# Patient Record
Sex: Female | Born: 1935 | Race: Black or African American | Hispanic: No | Smoking: Never smoker
Health system: Southern US, Community
[De-identification: ages and names within clinical notes are randomized; demographics above are authoritative.]

## PROBLEM LIST (undated history)

## (undated) DIAGNOSIS — F419 Anxiety disorder, unspecified: Secondary | ICD-10-CM

## (undated) DIAGNOSIS — I4891 Unspecified atrial fibrillation: Secondary | ICD-10-CM

## (undated) DIAGNOSIS — F0150 Vascular dementia without behavioral disturbance: Secondary | ICD-10-CM

## (undated) DIAGNOSIS — F32A Depression, unspecified: Secondary | ICD-10-CM

## (undated) DIAGNOSIS — N189 Chronic kidney disease, unspecified: Secondary | ICD-10-CM

## (undated) DIAGNOSIS — E785 Hyperlipidemia, unspecified: Secondary | ICD-10-CM

## (undated) DIAGNOSIS — E611 Iron deficiency: Secondary | ICD-10-CM

## (undated) DIAGNOSIS — I10 Essential (primary) hypertension: Secondary | ICD-10-CM

## (undated) HISTORY — PX: PACEMAKER IMPLANT: EP1218

---

## 2006-08-17 DIAGNOSIS — E785 Hyperlipidemia, unspecified: Secondary | ICD-10-CM | POA: Insufficient documentation

## 2006-08-17 DIAGNOSIS — Z9079 Acquired absence of other genital organ(s): Secondary | ICD-10-CM | POA: Insufficient documentation

## 2006-09-25 DIAGNOSIS — M129 Arthropathy, unspecified: Secondary | ICD-10-CM | POA: Insufficient documentation

## 2007-12-20 DIAGNOSIS — D649 Anemia, unspecified: Secondary | ICD-10-CM | POA: Insufficient documentation

## 2010-08-11 DIAGNOSIS — F3113 Bipolar disorder, current episode manic without psychotic features, severe: Secondary | ICD-10-CM | POA: Insufficient documentation

## 2010-08-17 DIAGNOSIS — M159 Polyosteoarthritis, unspecified: Secondary | ICD-10-CM | POA: Insufficient documentation

## 2011-04-05 DIAGNOSIS — J45909 Unspecified asthma, uncomplicated: Secondary | ICD-10-CM | POA: Insufficient documentation

## 2013-04-22 DIAGNOSIS — G8929 Other chronic pain: Secondary | ICD-10-CM | POA: Insufficient documentation

## 2013-04-22 DIAGNOSIS — I10 Essential (primary) hypertension: Secondary | ICD-10-CM | POA: Diagnosis present

## 2013-05-27 DIAGNOSIS — Z96653 Presence of artificial knee joint, bilateral: Secondary | ICD-10-CM | POA: Insufficient documentation

## 2013-05-27 DIAGNOSIS — Z9889 Other specified postprocedural states: Secondary | ICD-10-CM | POA: Insufficient documentation

## 2013-12-11 DIAGNOSIS — F319 Bipolar disorder, unspecified: Secondary | ICD-10-CM | POA: Insufficient documentation

## 2019-06-05 DIAGNOSIS — Z961 Presence of intraocular lens: Secondary | ICD-10-CM | POA: Insufficient documentation

## 2019-06-05 DIAGNOSIS — H527 Unspecified disorder of refraction: Secondary | ICD-10-CM | POA: Insufficient documentation

## 2019-06-05 DIAGNOSIS — H04123 Dry eye syndrome of bilateral lacrimal glands: Secondary | ICD-10-CM | POA: Insufficient documentation

## 2019-06-05 DIAGNOSIS — H43812 Vitreous degeneration, left eye: Secondary | ICD-10-CM | POA: Insufficient documentation

## 2020-11-05 ENCOUNTER — Other Ambulatory Visit: Payer: Self-pay

## 2020-11-05 ENCOUNTER — Encounter: Payer: Self-pay | Admitting: Intensive Care

## 2020-11-05 ENCOUNTER — Inpatient Hospital Stay
Admission: EM | Admit: 2020-11-05 | Discharge: 2020-11-08 | DRG: 392 | Disposition: A | Discharge status: Skilled Nursing Facility | Payer: Medicare Other | Source: Skilled Nursing Facility | Attending: Internal Medicine | Admitting: Internal Medicine

## 2020-11-05 ENCOUNTER — Emergency Department: Payer: Medicare Other

## 2020-11-05 DIAGNOSIS — Z7901 Long term (current) use of anticoagulants: Secondary | ICD-10-CM

## 2020-11-05 DIAGNOSIS — R531 Weakness: Secondary | ICD-10-CM

## 2020-11-05 DIAGNOSIS — M545 Low back pain, unspecified: Secondary | ICD-10-CM | POA: Diagnosis not present

## 2020-11-05 DIAGNOSIS — K5900 Constipation, unspecified: Secondary | ICD-10-CM | POA: Diagnosis not present

## 2020-11-05 DIAGNOSIS — Z66 Do not resuscitate: Secondary | ICD-10-CM | POA: Diagnosis present

## 2020-11-05 DIAGNOSIS — Z950 Presence of cardiac pacemaker: Secondary | ICD-10-CM

## 2020-11-05 DIAGNOSIS — E785 Hyperlipidemia, unspecified: Secondary | ICD-10-CM | POA: Diagnosis present

## 2020-11-05 DIAGNOSIS — I129 Hypertensive chronic kidney disease with stage 1 through stage 4 chronic kidney disease, or unspecified chronic kidney disease: Secondary | ICD-10-CM | POA: Diagnosis present

## 2020-11-05 DIAGNOSIS — K5903 Drug induced constipation: Principal | ICD-10-CM | POA: Diagnosis present

## 2020-11-05 DIAGNOSIS — Z8659 Personal history of other mental and behavioral disorders: Secondary | ICD-10-CM

## 2020-11-05 DIAGNOSIS — I2510 Atherosclerotic heart disease of native coronary artery without angina pectoris: Secondary | ICD-10-CM | POA: Diagnosis present

## 2020-11-05 DIAGNOSIS — E875 Hyperkalemia: Secondary | ICD-10-CM | POA: Diagnosis present

## 2020-11-05 DIAGNOSIS — E041 Nontoxic single thyroid nodule: Secondary | ICD-10-CM | POA: Diagnosis present

## 2020-11-05 DIAGNOSIS — N183 Chronic kidney disease, stage 3 unspecified: Secondary | ICD-10-CM

## 2020-11-05 DIAGNOSIS — Z6826 Body mass index (BMI) 26.0-26.9, adult: Secondary | ICD-10-CM

## 2020-11-05 DIAGNOSIS — T40605A Adverse effect of unspecified narcotics, initial encounter: Secondary | ICD-10-CM | POA: Diagnosis present

## 2020-11-05 DIAGNOSIS — F319 Bipolar disorder, unspecified: Secondary | ICD-10-CM | POA: Diagnosis present

## 2020-11-05 DIAGNOSIS — N1832 Chronic kidney disease, stage 3b: Secondary | ICD-10-CM | POA: Diagnosis present

## 2020-11-05 DIAGNOSIS — Z91041 Radiographic dye allergy status: Secondary | ICD-10-CM

## 2020-11-05 DIAGNOSIS — Y92129 Unspecified place in nursing home as the place of occurrence of the external cause: Secondary | ICD-10-CM

## 2020-11-05 DIAGNOSIS — R54 Age-related physical debility: Secondary | ICD-10-CM | POA: Diagnosis present

## 2020-11-05 DIAGNOSIS — R079 Chest pain, unspecified: Secondary | ICD-10-CM

## 2020-11-05 DIAGNOSIS — Z79899 Other long term (current) drug therapy: Secondary | ICD-10-CM

## 2020-11-05 DIAGNOSIS — Z91013 Allergy to seafood: Secondary | ICD-10-CM

## 2020-11-05 DIAGNOSIS — Z8616 Personal history of COVID-19: Secondary | ICD-10-CM

## 2020-11-05 DIAGNOSIS — E46 Unspecified protein-calorie malnutrition: Secondary | ICD-10-CM | POA: Diagnosis present

## 2020-11-05 DIAGNOSIS — Z20822 Contact with and (suspected) exposure to covid-19: Secondary | ICD-10-CM | POA: Diagnosis present

## 2020-11-05 DIAGNOSIS — I482 Chronic atrial fibrillation, unspecified: Secondary | ICD-10-CM | POA: Diagnosis present

## 2020-11-05 DIAGNOSIS — F419 Anxiety disorder, unspecified: Secondary | ICD-10-CM | POA: Diagnosis present

## 2020-11-05 HISTORY — DX: Essential (primary) hypertension: I10

## 2020-11-05 LAB — BASIC METABOLIC PANEL
Anion gap: 5 (ref 5–15)
BUN: 23 mg/dL (ref 8–23)
CO2: 28 mmol/L (ref 22–32)
Calcium: 9.4 mg/dL (ref 8.9–10.3)
Chloride: 98 mmol/L (ref 98–111)
Creatinine, Ser: 1.4 mg/dL — ABNORMAL HIGH (ref 0.44–1.00)
GFR, Estimated: 37 mL/min — ABNORMAL LOW (ref >60)
Glucose, Bld: 78 mg/dL (ref 70–99)
Potassium: 5.2 mmol/L — ABNORMAL HIGH (ref 3.5–5.1)
Sodium: 131 mmol/L — ABNORMAL LOW (ref 135–145)

## 2020-11-05 LAB — RESP PANEL BY RT-PCR (FLU A&B, COVID) ARPGX2
Influenza A by PCR: NEGATIVE
Influenza B by PCR: NEGATIVE
SARS Coronavirus 2 by RT PCR: NEGATIVE

## 2020-11-05 LAB — CBC
HCT: 40 % (ref 36.0–46.0)
Hemoglobin: 12.7 g/dL (ref 12.0–15.0)
MCH: 27.7 pg (ref 26.0–34.0)
MCHC: 31.8 g/dL (ref 30.0–36.0)
MCV: 87.1 fL (ref 80.0–100.0)
Platelets: 267 10*3/uL (ref 150–400)
RBC: 4.59 MIL/uL (ref 3.87–5.11)
RDW: 12.8 % (ref 11.5–15.5)
WBC: 4.8 10*3/uL (ref 4.0–10.5)
nRBC: 0 % (ref 0.0–0.2)

## 2020-11-05 LAB — TSH: TSH: 0.548 u[IU]/mL (ref 0.350–4.500)

## 2020-11-05 LAB — URINALYSIS, COMPLETE (UACMP) WITH MICROSCOPIC
Bacteria, UA: NONE SEEN
Bilirubin Urine: NEGATIVE
Glucose, UA: NEGATIVE mg/dL
Hgb urine dipstick: NEGATIVE
Ketones, ur: NEGATIVE mg/dL
Leukocytes,Ua: NEGATIVE
Nitrite: NEGATIVE
Protein, ur: NEGATIVE mg/dL
Specific Gravity, Urine: 1.005 (ref 1.005–1.030)
pH: 6 (ref 5.0–8.0)

## 2020-11-05 LAB — TROPONIN I (HIGH SENSITIVITY)
Troponin I (High Sensitivity): 14 ng/L (ref <18)
Troponin I (High Sensitivity): 14 ng/L (ref <18)

## 2020-11-05 MED ORDER — IOHEXOL 350 MG/ML SOLN
75.0000 mL | Freq: Once | INTRAVENOUS | Status: AC | PRN
  Administered 2020-11-05: 75 mL via INTRAVENOUS

## 2020-11-05 MED ORDER — APIXABAN 2.5 MG PO TABS
2.5000 mg | ORAL_TABLET | Freq: Two times a day (BID) | ORAL | Status: DC
  Administered 2020-11-06: 2.5 mg via ORAL
  Filled 2020-11-05 (×3): qty 1

## 2020-11-05 MED ORDER — SODIUM POLYSTYRENE SULFONATE 15 GM/60ML PO SUSP
15.0000 g | Freq: Once | ORAL | Status: AC
  Administered 2020-11-05: 15 g via ORAL
  Filled 2020-11-05: qty 60

## 2020-11-05 MED ORDER — TRAMADOL HCL 50 MG PO TABS
50.0000 mg | ORAL_TABLET | Freq: Once | ORAL | Status: AC
  Administered 2020-11-05: 50 mg via ORAL
  Filled 2020-11-05: qty 1

## 2020-11-05 MED ORDER — HYDROMORPHONE HCL 1 MG/ML IJ SOLN
0.5000 mg | Freq: Once | INTRAMUSCULAR | Status: AC
  Administered 2020-11-05: 0.5 mg via INTRAVENOUS
  Filled 2020-11-05: qty 1

## 2020-11-05 MED ORDER — ACETAMINOPHEN 325 MG PO TABS
650.0000 mg | ORAL_TABLET | ORAL | Status: DC | PRN
  Administered 2020-11-07: 650 mg via ORAL
  Filled 2020-11-05: qty 2

## 2020-11-05 MED ORDER — OLANZAPINE 10 MG IM SOLR
5.0000 mg | Freq: Every day | INTRAMUSCULAR | Status: AC
  Administered 2020-11-05: 5 mg via INTRAMUSCULAR
  Filled 2020-11-05: qty 10

## 2020-11-05 MED ORDER — ONDANSETRON HCL 4 MG/2ML IJ SOLN: 4.0000 mg | Freq: Four times a day (QID) | INTRAMUSCULAR | Status: DC | PRN

## 2020-11-05 MED ORDER — MORPHINE SULFATE (PF) 4 MG/ML IV SOLN
4.0000 mg | Freq: Once | INTRAVENOUS | Status: AC
  Administered 2020-11-05: 4 mg via INTRAVENOUS
  Filled 2020-11-05: qty 1

## 2020-11-05 MED ORDER — OLANZAPINE 5 MG PO TABS: 5.0000 mg | ORAL_TABLET | Freq: Every day | ORAL | Status: DC

## 2020-11-05 MED ORDER — ENOXAPARIN SODIUM 40 MG/0.4ML IJ SOSY: 40.0000 mg | PREFILLED_SYRINGE | INTRAMUSCULAR | Status: DC

## 2020-11-05 MED ORDER — ACETAMINOPHEN 650 MG RE SUPP: 650.0000 mg | Freq: Four times a day (QID) | RECTAL | Status: DC | PRN

## 2020-11-05 MED ORDER — DILTIAZEM HCL 25 MG/5ML IV SOLN
10.0000 mg | Freq: Two times a day (BID) | INTRAVENOUS | Status: DC | PRN
Start: 2020-11-05 — End: 2020-11-08

## 2020-11-05 MED ORDER — ALPRAZOLAM 0.25 MG PO TABS
0.2500 mg | ORAL_TABLET | Freq: Two times a day (BID) | ORAL | Status: DC | PRN
  Administered 2020-11-05 – 2020-11-07 (×3): 0.25 mg via ORAL
  Filled 2020-11-05 (×3): qty 1

## 2020-11-05 MED ORDER — LORAZEPAM 2 MG/ML IJ SOLN: 1.0000 mg | Freq: Once | INTRAMUSCULAR | Status: DC

## 2020-11-05 MED ORDER — SODIUM CHLORIDE 0.9 % IV BOLUS
1000.0000 mL | Freq: Once | INTRAVENOUS | Status: AC
  Administered 2020-11-05: 1000 mL via INTRAVENOUS

## 2020-11-05 NOTE — ED Triage Notes (Signed)
Pt comes into the ED via EMS from [facility] with c/o pain all over x3 days  220/110 HR67 97%RA 98.6 temp

## 2020-11-05 NOTE — ED Triage Notes (Signed)
PAtient arrived from [facility] by EMS. Reports pain all over X3 days. C/o chest tightness. Patient crying out in triage. Denies falling

## 2020-11-05 NOTE — ED Notes (Signed)
Pt had difficulty swallowing the oral  Kayexalate.  She began to cough

## 2020-11-05 NOTE — ED Provider Notes (Signed)
[facility] Emergency Department Provider Note ____________________________________________   Event Date/Time   First MD Initiated Contact with Patient 11/05/20 1502     (approximate)  I have reviewed the triage vital signs and the nursing notes.   HISTORY  Chief Complaint Chest Pain  Level 5 COVID: History of present illness limited due to poor historian  HPI Katrina Chen is a 85 y.o. female with PMH of hypertension, atrial fibrillation (on Eliquis), CKD, and hyperlipidemia who presents with multiple symptoms.  The patient reports pain "all over" over the last couple days and specifically heaviness in her chest and head.  She also has some burning in both legs.  The daughter reports that the patient's blood pressure medication was changed last week and her blood pressure has been higher than normal and fluctuating.  The patient has been fatigued and over the last couple of days seems slightly confused compared to baseline.  She has not been eating and drinking much and has decreased urine output.  The patient and daughter deny fever, vomiting, significant abdominal pain, or dysuria.  Past Medical History:  Diagnosis Date   Hypertension     Patient Active Problem List   Diagnosis Date Noted   Chest pain 11/05/2020   CAD (coronary artery disease) 11/05/2020   Hyperkalemia 11/05/2020   Obstipation 11/05/2020   History of bipolar disorder 11/05/2020   Atrial fibrillation, chronic (HCC) 11/05/2020   CKD (chronic kidney disease), stage III (HCC) 11/05/2020   Frail elderly 11/05/2020   Weakness 11/05/2020   Protein-calorie malnutrition (HCC) 11/05/2020     History reviewed. No pertinent surgical history.  Prior to Admission medications   Not on File    Allergies Iodine and Shellfish allergy  History reviewed. No pertinent family history.  Social History Social History   Tobacco Use   Smoking status: Never   Smokeless tobacco: Never   Substance Use Topics   Alcohol use: Never   Drug use: Never    Review of Systems Level 5 caveat: Review of systems limited due to poor historian Constitutional: No fever.  Positive for fatigue. Cardiovascular: Positive for chest pain. Respiratory: Denies acute shortness of breath. Gastrointestinal: No vomiting or diarrhea.  Genitourinary: Negative for dysuria.  Musculoskeletal: Positive for back pain. Neurological: Positive for headache.   ____________________________________________   PHYSICAL EXAM:  VITAL SIGNS: ED Triage Vitals  Enc Vitals Group     BP 11/05/20 1332 120/83     Pulse Rate 11/05/20 1332 61     Resp 11/05/20 1332 18     Temp 11/05/20 1332 98.2 F (36.8 C)     Temp Source 11/05/20 1332 Oral     SpO2 11/05/20 1332 100 %     Weight 11/05/20 1333 169 lb (76.7 kg)     Height 11/05/20 1333 5\' 7"  (1.702 m)     Head Circumference --      [facility] --      Pain Score 11/05/20 1332 10     Pain Loc --      Pain Edu? --      Excl. in GC? --     Constitutional: Alert and oriented.  Somewhat weak appearing but in no acute distress. Eyes: Conjunctivae are normal.  Head: Atraumatic. Nose: No congestion/rhinnorhea. Mouth/Throat: Mucous membranes are dry. Neck: Normal range of motion.  Cardiovascular: Normal rate, regular rhythm. Good peripheral circulation. Respiratory: Normal respiratory effort.  No retractions.  Gastrointestinal: Soft and nontender. No distention.  Genitourinary: No flank tenderness.  Musculoskeletal: No lower extremity edema.  Extremities warm and well perfused.  Neurologic:  Normal speech and language.  Motor intact in all extremities.  No gross neurological deficits. Skin:  Skin is warm and dry. No rash noted. Psychiatric: Calm and cooperative.  ____________________________________________   LABS (all labs ordered are listed, but only abnormal results are displayed)  Labs Reviewed  BASIC METABOLIC PANEL - Abnormal; Notable for the  following components:      Result Value   Sodium 131 (*)    Potassium 5.2 (*)    Creatinine, Ser 1.40 (*)    GFR, Estimated 37 (*)    All other components within normal limits  URINALYSIS, COMPLETE (UACMP) WITH MICROSCOPIC - Abnormal; Notable for the following components:   Color, Urine STRAW (*)    APPearance CLEAR (*)    All other components within normal limits  RESP PANEL BY RT-PCR (FLU A&B, COVID) ARPGX2  CBC  TSH  LIPID PANEL  VITAMIN B12  MAGNESIUM  TROPONIN I (HIGH SENSITIVITY)  TROPONIN I (HIGH SENSITIVITY)   ____________________________________________  EKG  ED ECG REPORT I, Dionne Bucy, the attending physician, personally viewed and interpreted this ECG.  Date: 11/05/2020 EKG Time: 1339 Rate: 62 Rhythm: Ventricular paced rhythm QRS Axis: N/A Intervals: N/A ST/T Wave abnormalities: normal Narrative Interpretation: Ventricular paced rhythm with no evidence of acute ischemia; no prior EKG available for comparison  ____________________________________________  RADIOLOGY  Chest x-ray interpreted by me shows no focal consolidation or edema CT abdomen/pelvis: No acute abnormality CT lumbar spine: No acute fracture CT head: No acute abnormality CT angio chest/abdomen/pelvis: No acute vascular abnormality  ____________________________________________   PROCEDURES  Procedure(s) performed: No  Procedures  Critical Care performed: No ____________________________________________   INITIAL IMPRESSION / ASSESSMENT AND PLAN / ED COURSE  Pertinent labs & imaging results that were available during my care of the patient were reviewed by me and considered in my medical decision making (see chart for details).   85 year old female with PMH as noted above including hypertension, atrial fibrillation on Eliquis, pacemaker, CKD, and hyperlipidemia presents with multiple symptoms over the last several days to 1 week, most significantly generalized pain, some  chest pain and headache, fatigue, and decreased p.o. intake.  The daughter also endorses some mild confusion.  I reviewed the past medical records in epic; the patient has no prior ED visits or admissions here.  I also reviewed records in care everywhere.  The patient was seen by cardiology on 7/22 and at that time no changes were made in her hypertension regimen.  The daughter reports that her blood pressure medications were changed last week but I do not have access to a record of this visit.  On exam the patient is somewhat weak appearing but in no acute distress.  Her vital signs here are normal.  The physical exam is unremarkable except for dry mucous membranes.  Abdomen soft and nontender.  Neurologic exam is nonfocal.  Differential is extremely broad but includes ACS, rapid atrial fibrillation, dehydration, AKI, electrolyte or other metabolic abnormality, UTI, COVID-19, or other infection.  I do not suspect aortic dissection or other vascular etiology given that the generalized location of the pain and the fact that it appears to have now mostly resolved.  We will obtain lab work-up, chest x-ray, urinalysis, give fluids and reassess.  ----------------------------------------- 7:15 PM on 11/05/2020 -----------------------------------------  Urinalysis and other lab work-up is unremarkable.  The patient is COVID-negative.  Troponins are negative.  Chest x-ray did not  show any acute abnormality.  Although she was comfortable during my initial exam, on reassessment the patient was tearful and was reporting increased pain that now localized mainly to the lower back.  Based on discussion with the patient and daughter I ordered a CT head (due to the recent mild confusion) as well as a CT of the abdomen, pelvis, and L-spine to evaluate for intra-abdominal causes, lumbar compression fracture, or other cause of the acute pain.  These studies are all negative.  On reassessment, the patient again has  severe pain which is all over but worse in the back.  Although her initial presentation was not consistent with vascular etiology, given this persistent severe pain, the location in the chest and back, and the patient's elevated blood pressure, I have ordered a CT angio dissection study to evaluate for aortic dissection, renal artery infarct, or other vascular cause.  ----------------------------------------- 9:30 PM on 11/05/2020 -----------------------------------------   CT angio showed no acute findings other than a stool ball in the rectum which would not explain the patient's symptoms.  On reassessment, the patient continues to appear tearful and reports pain although has difficulty localizing it.  The son-in-law pulled me aside and stated that the patient has a long history of psychiatric problems and has had similar episodes mediated by anxiety, and he feels that this is the likely etiology of the patient's current presentation.  Given the extensive negative work-up overall I feel that anxiety or conversion is a likely cause of the patient's symptoms.  Given her age and the fact that she is still having persistent pain,   ____________________________________________   FINAL CLINICAL IMPRESSION(S) / ED DIAGNOSES  Final diagnoses:  Low back pain  Nonspecific chest pain      NEW MEDICATIONS STARTED DURING THIS VISIT:  New Prescriptions   No medications on file     Note:  This document was prepared using Dragon voice recognition software and may include unintentional dictation errors.    Dionne Bucy, MD 11/05/20 2352

## 2020-11-05 NOTE — H&P (Signed)
History and Physical   Katrina Chen FWY:[id number] DOB: 04/29/1935 DOA: 11/05/2020  PCP: Youlanda Roys, MD  Outpatient Specialists: Dr. Harle Stanford, [place] Patient coming from: [facility]  I have personally briefly reviewed patient's old medical records in [place].  Chief Concern: Chest pain, diffuse body pain  HPI: Katrina Chen is a 85 y.o. female with medical history significant for history of bipolar, history of hypertension, chronic atrial fibrillation, CHA2DS2-VASc score of 4, on chronic anticoagulation, status post pacemaker, diverticulosis, CAD, presents to the emergency department for chief concerns of hurting all over including chest pain.  At bedside patient is awake and alert and oriented.  Weakly follows commands.  However she does not want to participate fully in physical exam.  She moans and groans.  Daughter and son-in-law are at bedside.  Daughter reports that patient had a bowel movement 2 days ago. She lives at facility, [facility] and daughter does not know the consistency of her bowel movement last.  Social history: Patient is from [facility]. Daughter at bedside denies patient's history of tobacco, recreational, EtOH use.  Patient is currently retired and formerly worked as a Tax inspector.  Vaccination history: Patient is vaccinated for COVID-19, 2 doses.  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  ROS: Unable to complete as patient does not participate in physical exam  ED Course: Discussed with emergency medicine provider, patient requiring hospitalization for chief concerns of chest pain.  Vitals in the emergency department was remarkable for temperature of 98.2, respiration rate of 18, heart rate 61, blood pressure 120/83, SPO2 100% on room air.  Labs in the emergency department was remarkable for sodium 131, potassium 5.2, chloride 98, bicarb 28, BUN 23, serum creatinine of 1.40, nonfasting blood glucose of 78, EGFR  37, WBC 4.8, hemoglobin 12.7, platelets 267.  UA was negative for nitrates and leukocytes.  COVID PCR was negative.  Troponin was 14 and on recheck it was 14.  TSH was within normal limits.  EDP ordered morphine 4 mg IV, tramadol 50 mg, Ativan 1 mg IV, Dilaudid 0.5 mg IV, normal saline 1 L bolus.  Assessment/Plan  Principal Problem:   Obstipation Active Problems:   Chest pain   CAD (coronary artery disease)   Hyperkalemia   History of bipolar disorder   Atrial fibrillation, chronic (HCC)   CKD (chronic kidney disease), stage III (HCC)   Frail elderly   Weakness   Protein-calorie malnutrition (HCC)   # Diffuse generalized pain and weakness - I suspect this is obstipation in setting of chronic constipation - CT imaging showed 6 cm stool ball in the left colon - Checking magnesium level, B12 - Would recommend a.m. team to consult PT/OT if patient's weakness does not improve with bowel movement  # She initially reported chest pain-I doubt this is ACS as troponin has been 14 and 14 on repeat # History of CAD - EKG was negative for ST-T wave changes  # Hyperkalemia-I suspect this is secondary to constipation - Kayexalate 15 mg oral, one-time dose ordered - BMP in the a.m.  # CKD 3B-at baseline - Serum creatinine on presentation is 1.4, GFR 37 - Baseline serum creatinine from 2020 until now is 1.2-1.5, EGFR ranging from 37-45-  # History of hypertension-diltiazem injection 10 mg IV twice daily, prn for SBP greater than 170, 3 doses ordered -Patient takes carvedilol 3.125 mg tablets, 6.25 mg p.o. twice daily with meals, losartan 100 mg daily  # History of atrial  fibrillation on Eliquis - Eliquis 2.5 twice daily resumed  # History of anxiety/anxiety-EDP ordered Ativan 1 mg IV once - Duloxetine 60 mg twice daily - I resumed her home Xanax 0.25 mg p.o. twice daily as needed for anxiety  # 1.9 cm thyroid nodule-outpatient follow-up # Protein/calorie malnutrition-mild to  moderate - Dietitian has been consulted  # History of bipolar-Zyprexa 5 mg nightly  # Diverticulosis-no diverticulitis at this time  Takes multivitamins at home including calcium carbonate 500 mg every 4 hours by mouth, vitamin D, 50,000 units once per week Chart reviewed.   DVT prophylaxis: Eliquis 2.5 mg twice daily Code Status: Full code Diet: Heart healthy Family Communication: Updated daughter at bedside Disposition Plan: Pending clinical course Consults called: Dietary Admission status: Observation, MedSurg, telemetry ordered for 12 hours  Past Medical History:  Diagnosis Date   Hypertension    History reviewed. No pertinent surgical history.  Social History:  reports that she has never smoked. She has never used smokeless tobacco. She reports that she does not drink alcohol and does not use drugs.  Allergies  Allergen Reactions   Iodine    Shellfish Allergy    History reviewed. No pertinent family history. Family history: Family history reviewed and not pertinent  Prior to Admission medications   Not on File   Physical Exam: Vitals:   11/05/20 1500 11/05/20 1715 11/05/20 1900 11/05/20 2030  BP: (!) 140/59  (!) 183/163 (!) 153/136  Pulse: 60 97 61 (!) 47  Resp: 13 (!) 26 20 (!) 22  Temp:      TempSrc:      SpO2: 99% 100% 100% 100%  Weight:      Height:       Constitutional: appears age-appropriate, frail, NAD, calm, comfortable Eyes: PERRL, lids and conjunctivae normal ENMT: Mucous membranes are moist. Posterior pharynx clear of any exudate or lesions. Age-appropriate dentition.  Dentures in place.  Hearing appropriate. Neck: normal, supple, no masses, no thyromegaly Respiratory: clear to auscultation bilaterally, no wheezing, no crackles. Normal respiratory effort. No accessory muscle use.  Cardiovascular: Regular rate and rhythm, no murmurs / rubs / gallops. No extremity edema. 2+ pedal pulses. No carotid bruits.  Abdomen: no masses palpated, no  hepatosplenomegaly.  Decreased bowel sounds.  Mild generalized abdominal tenderness.  Soft. Musculoskeletal: no clubbing / cyanosis. No joint deformity upper and lower extremities. Good ROM, no contractures, no atrophy. Normal muscle tone.  Skin: no rashes, lesions, ulcers. No induration Neurologic: Sensation intact. Strength 4/5 in all 4.  Weakly moves extremities. Psychiatric: Normal judgment and insight. Alert and oriented x 3. Normal mood.   EKG: independently reviewed, showing ventricular paced, atrial flutter rate of 62, QTc 464  Chest x-ray on Admission: I personally reviewed and I agree with radiologist reading as below.  CT ABDOMEN PELVIS WO CONTRAST  Result Date: 11/05/2020 CLINICAL DATA:  Flank pain, kidney stone suspected Low back pain, increased fracture risk Back and flank pain, unknown cause. Denies falling, pt states "it feels like my back is breaking!" EXAM: CT ABDOMEN AND PELVIS WITHOUT CONTRAST CT LUMBAR SPINE WITHOUT CONTRAST TECHNIQUE: Multidetector CT imaging of the abdomen and pelvis was performed following the standard protocol without IV contrast. Multidetector CT imaging of the lumbar spine was performed without intravenous contrast administration. Multiplanar CT image reconstructions were also generated. COMPARISON:  None. FINDINGS: CT ABD/PELVIS Lower chest: Linear atelectasis versus scarring of the right base. Partially visualized cardiac lead. Otherwise no acute abnormality. Hepatobiliary: No focal liver abnormality. No gallstones,  gallbladder wall thickening, or pericholecystic fluid. No biliary dilatation. Pancreas: No focal lesion. Normal pancreatic contour. No surrounding inflammatory changes. No main pancreatic ductal dilatation. Spleen: Normal in size without focal abnormality. Adrenals/Urinary Tract: No adrenal nodule bilaterally. No nephrolithiasis and no hydronephrosis. No definite contour-deforming renal mass. No ureterolithiasis or hydroureter. The urinary bladder  is distended with urine and grossly unremarkable. Stomach/Bowel: Stomach is within normal limits. No evidence of bowel wall thickening or dilatation. Stool throughout the left colon. Diffuse sigmoid diverticulosis. No pneumatosis. Appendix appears normal. Vascular/Lymphatic: No abdominal aorta or iliac aneurysm. Severe atherosclerotic plaque of the aorta and its branches. No abdominal, pelvic, or inguinal lymphadenopathy. Reproductive: Status post hysterectomy. No adnexal masses. Other: No intraperitoneal free fluid. No intraperitoneal free gas. No organized fluid collection. Musculoskeletal: No abdominal wall hernia or abnormality. No suspicious lytic or blastic osseous lesions. No acute displaced fracture. CT LUMBAR Segmentation: 5 non rib-bearing lumbar report T Wo bodies are identified Alignment: Straightening of the normal lumbar lordosis likely due to positioning and surgical hardware. Vertebrae: Status post L3 through S1 posterolateral and interbody fusion. Status post laminectomy at these levels. Multilevel degenerative changes of the spine. No acute fracture or focal pathologic process. No suspicious lytic or blastic osseous lesions. There is a densely sclerotic lesion within the right iliac bone that may represent a bone island. Well-defined lytic lesion with a narrow zone of transmission within the right iliac bone along the sacroiliac joint is likely of benign etiology. Paraspinal and other soft tissues: Negative. IMPRESSION: 1. No acute intra-abdominal or intrapelvic abnormality with limited evaluation on this noncontrast study. 2. Diffuse sigmoid diverticulosis with no acute diverticulitis. 3. Stool throughout the left colon. 4. No acute displaced fracture or traumatic listhesis of the lumbar spine in a patient status post L3 through S1 posterolateral and interbody fusion. 5. A densely sclerotic lesion within the right iliac bone may represent a bone island. Comparison with prior imaging would be of  value. 6.  Aortic Atherosclerosis (ICD10-I70.0)  - severe. Electronically Signed   By: Iven Finn M.D.   On: 11/05/2020 18:19   DG Chest 2 View  Result Date: 11/05/2020 CLINICAL DATA:  Chest tightness for 3 days EXAM: CHEST - 2 VIEW COMPARISON:  None. FINDINGS: Frontal and lateral views of the chest demonstrate an unremarkable cardiac silhouette. Loop recorder is seen within the left anterior chest. No airspace disease, effusion, or pneumothorax. No acute bony abnormalities. IMPRESSION: 1. No acute intrathoracic process. Electronically Signed   By: Randa Ngo M.D.   On: 11/05/2020 15:42   CT HEAD WO CONTRAST (5MM)  Result Date: 11/05/2020 CLINICAL DATA:  Mental status change. Unknown cause. Pain all over. Chest tightness. EXAM: CT HEAD WITHOUT CONTRAST TECHNIQUE: Contiguous axial images were obtained from the base of the skull through the vertex without intravenous contrast. COMPARISON:  None. BRAIN: Atherosclerotic calcifications are present within the cavernous internal carotid arteries. No evidence of large-territorial acute infarction. No parenchymal hemorrhage. No mass lesion. No extra-axial collection. No mass effect or midline shift. No hydrocephalus. Basilar cisterns are patent. Vascular: No hyperdense vessel. Skull: No acute fracture or focal lesion. Sinuses/Orbits: Paranasal sinuses and mastoid air cells are clear. Bilateral lens replacement. Otherwise orbits are unremarkable. Other: None. IMPRESSION: No acute intracranial abnormality. Electronically Signed   By: Iven Finn M.D.   On: 11/05/2020 18:07   CT L-SPINE NO CHARGE  Result Date: 11/05/2020 CLINICAL DATA:  Flank pain, kidney stone suspected Low back pain, increased fracture risk Back and flank pain,  unknown cause. Denies falling, pt states "it feels like my back is breaking!" EXAM: CT ABDOMEN AND PELVIS WITHOUT CONTRAST CT LUMBAR SPINE WITHOUT CONTRAST TECHNIQUE: Multidetector CT imaging of the abdomen and pelvis was  performed following the standard protocol without IV contrast. Multidetector CT imaging of the lumbar spine was performed without intravenous contrast administration. Multiplanar CT image reconstructions were also generated. COMPARISON:  None. FINDINGS: CT ABD/PELVIS Lower chest: Linear atelectasis versus scarring of the right base. Partially visualized cardiac lead. Otherwise no acute abnormality. Hepatobiliary: No focal liver abnormality. No gallstones, gallbladder wall thickening, or pericholecystic fluid. No biliary dilatation. Pancreas: No focal lesion. Normal pancreatic contour. No surrounding inflammatory changes. No main pancreatic ductal dilatation. Spleen: Normal in size without focal abnormality. Adrenals/Urinary Tract: No adrenal nodule bilaterally. No nephrolithiasis and no hydronephrosis. No definite contour-deforming renal mass. No ureterolithiasis or hydroureter. The urinary bladder is distended with urine and grossly unremarkable. Stomach/Bowel: Stomach is within normal limits. No evidence of bowel wall thickening or dilatation. Stool throughout the left colon. Diffuse sigmoid diverticulosis. No pneumatosis. Appendix appears normal. Vascular/Lymphatic: No abdominal aorta or iliac aneurysm. Severe atherosclerotic plaque of the aorta and its branches. No abdominal, pelvic, or inguinal lymphadenopathy. Reproductive: Status post hysterectomy. No adnexal masses. Other: No intraperitoneal free fluid. No intraperitoneal free gas. No organized fluid collection. Musculoskeletal: No abdominal wall hernia or abnormality. No suspicious lytic or blastic osseous lesions. No acute displaced fracture. CT LUMBAR Segmentation: 5 non rib-bearing lumbar report T Wo bodies are identified Alignment: Straightening of the normal lumbar lordosis likely due to positioning and surgical hardware. Vertebrae: Status post L3 through S1 posterolateral and interbody fusion. Status post laminectomy at these levels. Multilevel  degenerative changes of the spine. No acute fracture or focal pathologic process. No suspicious lytic or blastic osseous lesions. There is a densely sclerotic lesion within the right iliac bone that may represent a bone island. Well-defined lytic lesion with a narrow zone of transmission within the right iliac bone along the sacroiliac joint is likely of benign etiology. Paraspinal and other soft tissues: Negative. IMPRESSION: 1. No acute intra-abdominal or intrapelvic abnormality with limited evaluation on this noncontrast study. 2. Diffuse sigmoid diverticulosis with no acute diverticulitis. 3. Stool throughout the left colon. 4. No acute displaced fracture or traumatic listhesis of the lumbar spine in a patient status post L3 through S1 posterolateral and interbody fusion. 5. A densely sclerotic lesion within the right iliac bone may represent a bone island. Comparison with prior imaging would be of value. 6.  Aortic Atherosclerosis (ICD10-I70.0)  - severe. Electronically Signed   By: Iven Finn M.D.   On: 11/05/2020 18:19   CT Angio Chest/Abd/Pel for Dissection W and/or Wo Contrast  Result Date: 11/05/2020 CLINICAL DATA:  Abdominal pain, aortic dissection suspected EXAM: CT ANGIOGRAPHY CHEST, ABDOMEN AND PELVIS TECHNIQUE: Non-contrast CT of the chest was initially obtained. Multidetector CT imaging through the chest, abdomen and pelvis was performed using the standard protocol during bolus administration of intravenous contrast. Multiplanar reconstructed images and MIPs were obtained and reviewed to evaluate the vascular anatomy. CONTRAST:  84mL OMNIPAQUE IOHEXOL 350 MG/ML SOLN COMPARISON:  None. FINDINGS: CTA CHEST FINDINGS Cardiovascular: Preferential opacification of the thoracic aorta. No evidence of thoracic aortic aneurysm or dissection. Moderate atherosclerotic plaque of the thoracic aorta. Four-vessel coronary artery calcifications. Normal heart size. No significant pericardial effusion. The  main pulmonary artery is normal in caliber. No central pulmonary embolus. Mediastinum/Nodes: No enlarged mediastinal, hilar, or axillary lymph nodes. Multiple hypodensities within thyroid  glands. There is a 1.9 cm right thyroid gland hypodense nodule. The trachea and esophagus demonstrate no significant findings. Lungs/Pleura: No focal consolidation. No pulmonary nodule. No pulmonary mass. No pleural effusion. No pneumothorax. Musculoskeletal: No chest wall abnormality. No suspicious lytic or blastic osseous lesions. No acute displaced fracture. Multilevel mild degenerative changes of the spine. Review of the MIP images confirms the above findings. CTA ABDOMEN AND PELVIS FINDINGS VASCULAR Aorta: Severe calcified and noncalcified atherosclerotic plaque. Normal caliber aorta without aneurysm, dissection, vasculitis. Celiac: At least mild atherosclerotic plaque. At least mild stenosis of the origin of the celiac artery. Patent without evidence of aneurysm, dissection, vasculitis or significant stenosis. SMA: At least mild atherosclerotic plaque. Patent without evidence of aneurysm, dissection, vasculitis or significant stenosis. Renals: Atherosclerotic plaque moderate severe stenosis at the origin of bilateral renal arteries. Opacification of the vessels distally is noted. Both renal arteries are patent without evidence of aneurysm, dissection, vasculitis, fibromuscular dysplasia or significant stenosis. IMA: Atherosclerotic plaque. 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 Opacification of the inferior mesenteric artery is noted distally. Otherwise patent without evidence of aneurysm, dissection, vasculitis or significant stenosis. Inflow: Patent without evidence of aneurysm, dissection, vasculitis or significant stenosis. Veins: No obvious venous abnormality within the limitations of this arterial phase study. Review of the MIP images confirms the above findings.  NON-VASCULAR Hepatobiliary: No focal liver abnormality. No gallstones, gallbladder wall thickening, or pericholecystic fluid. No biliary dilatation. Pancreas: No focal lesion. Normal pancreatic contour. No surrounding inflammatory changes. No main pancreatic ductal dilatation. Spleen: Normal in size without focal abnormality. Adrenals/Urinary Tract: No adrenal nodule bilaterally. Bilateral kidneys enhance symmetrically. 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 No hydronephrosis. No hydroureter. The urinary bladder is distended with urine. Stomach/Bowel: Stomach is within normal limits. No evidence of bowel wall thickening or dilatation. Mid rectal stool ball of 6 cm. Stool noted throughout the left colon. Diffuse sigmoid diverticulosis. No pneumatosis. Appendix appears normal. Lymphatic: No lymphadenopathy. Reproductive: Status post hysterectomy. No adnexal masses. Other: No intraperitoneal free fluid. No intraperitoneal free gas. No organized fluid collection. Musculoskeletal: No abdominal wall hernia or abnormality. No suspicious lytic or blastic osseous lesions. No acute displaced fracture. Status post L3 through S1 posterolateral and interbody usion. Status post laminectomy at these levels. Multilevel dgenerative changes of the spine. Review of the MIP images confirms the above findings. IMPRESSION: 1. No acute aortic abnormality. 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 2. No central pulmonary embolus. 3. No acute intrathoracic, intra-abdominal, intrapelvic abnormality. 4. Stool throughout the left colon with a 6 cm stool ball within the mid rectum. Diffuse sigmoid diverticulosis with no acute diverticulitis. 5. A 1.9 cm right thyroid gland nodule Recommend thyroid US (ref: J Am Coll Radiol. 2015 Feb;12(2):  143-50). In the setting of significant comorbidities or limited life expectancy, no follow-up recommended (ref: J Am Coll Radiol. 2015 Feb;12(2): 143-50). Electronically Signed   By: Tish Frederickson M.D.   On: 11/05/2020 20:20    Labs on Admission: I have personally reviewed following labs  CBC: Recent Labs  Lab 11/05/20 1340  WBC 4.8  HGB 12.7  HCT 40.0  MCV 87.1  PLT 267   Basic Metabolic Panel: Recent Labs  Lab 11/05/20 1340  NA 131*  K 5.2*  CL 98  CO2 28  GLUCOSE 78  BUN 23  CREATININE 1.40*  CALCIUM 9.4   GFR: Estimated Creatinine Clearance: 32.5 mL/min (A) (by C-G formula based on SCr of 1.4 mg/dL (H)).  Thyroid Function Tests: Recent Labs    11/05/20 1535  TSH 0.548   Anemia Panel: No results for input(s): VITAMINB12, FOLATE, FERRITIN, TIBC, IRON, RETICCTPCT in the last 72 hours.  Urine analysis:    Component Value Date/Time   COLORURINE STRAW (A) 11/05/2020 1535   APPEARANCEUR CLEAR (A) 11/05/2020 1535   LABSPEC 1.005 11/05/2020 1535   PHURINE 6.0 11/05/2020 1535   GLUCOSEU NEGATIVE 11/05/2020 1535   HGBUR NEGATIVE 11/05/2020 1535   BILIRUBINUR NEGATIVE 11/05/2020 1535   KETONESUR NEGATIVE 11/05/2020 1535   PROTEINUR NEGATIVE 11/05/2020 1535   NITRITE NEGATIVE 11/05/2020 1535   LEUKOCYTESUR NEGATIVE 11/05/2020 1535   Dr. Tobie Poet [facility]  If 7PM-7AM, please contact overnight-coverage provider If 7AM-7PM, please contact day coverage provider [email or web address]  11/05/2020, 10:24 PM

## 2020-11-05 NOTE — ED Notes (Addendum)
Pt did not tolerate oral medication well.  Md aware. Unsafe to swallow d/t aspiration concerns.

## 2020-11-06 DIAGNOSIS — K5900 Constipation, unspecified: Secondary | ICD-10-CM | POA: Diagnosis not present

## 2020-11-06 LAB — LIPID PANEL
Cholesterol: 172 mg/dL (ref 0–200)
HDL: 48 mg/dL (ref >40)
LDL Cholesterol: 104 mg/dL — ABNORMAL HIGH (ref 0–99)
Total CHOL/HDL Ratio: 3.6 RATIO
Triglycerides: 101 mg/dL (ref <150)
VLDL: 20 mg/dL (ref 0–40)

## 2020-11-06 LAB — VITAMIN B12: Vitamin B-12: 198 pg/mL (ref 180–914)

## 2020-11-06 LAB — MAGNESIUM: Magnesium: 2.6 mg/dL — ABNORMAL HIGH (ref 1.7–2.4)

## 2020-11-06 MED ORDER — DIPHENHYDRAMINE HCL 50 MG/ML IJ SOLN
12.5000 mg | Freq: Once | INTRAMUSCULAR | Status: AC
  Administered 2020-11-06: 12.5 mg via INTRAVENOUS
  Filled 2020-11-06: qty 1

## 2020-11-06 MED ORDER — SORBITOL 70 % SOLN
960.0000 mL | TOPICAL_OIL | Freq: Once | ORAL | Status: DC
  Filled 2020-11-06: qty 473

## 2020-11-06 MED ORDER — SENNOSIDES-DOCUSATE SODIUM 8.6-50 MG PO TABS
1.0000 | ORAL_TABLET | Freq: Two times a day (BID) | ORAL | Status: DC
  Administered 2020-11-06 – 2020-11-08 (×5): 1 via ORAL
  Filled 2020-11-06 (×5): qty 1

## 2020-11-06 MED ORDER — FLEET ENEMA 7-19 GM/118ML RE ENEM
1.0000 | ENEMA | Freq: Every day | RECTAL | Status: DC | PRN
  Administered 2020-11-06: 1 via RECTAL

## 2020-11-06 MED ORDER — MORPHINE SULFATE (PF) 2 MG/ML IV SOLN
2.0000 mg | INTRAVENOUS | Status: DC | PRN
  Administered 2020-11-06 – 2020-11-07 (×2): 2 mg via INTRAVENOUS
  Filled 2020-11-06 (×4): qty 1

## 2020-11-06 MED ORDER — APIXABAN 5 MG PO TABS
5.0000 mg | ORAL_TABLET | Freq: Two times a day (BID) | ORAL | Status: DC
  Administered 2020-11-06 – 2020-11-07 (×2): 5 mg via ORAL
  Filled 2020-11-06 (×2): qty 1

## 2020-11-06 MED ORDER — BISACODYL 10 MG RE SUPP
10.0000 mg | Freq: Every day | RECTAL | Status: DC | PRN
  Filled 2020-11-06 (×2): qty 1

## 2020-11-06 MED ORDER — POLYETHYLENE GLYCOL 3350 17 G PO PACK
17.0000 g | PACK | Freq: Every day | ORAL | Status: DC
  Administered 2020-11-06 – 2020-11-08 (×3): 17 g via ORAL
  Filled 2020-11-06 (×3): qty 1

## 2020-11-06 NOTE — Progress Notes (Signed)
PT Cancellation Note  Patient Details Name: Kenidee Cregan MRN: [id number] DOB: 04-01-1935   Cancelled Treatment:    Reason Eval/Treat Not Completed: Other (comment).  Hold per nsg to let enema work which pt just received.  Follow up another time.   Ivar Drape 11/06/2020, 3:15 PM  Samul Dada, PT MS Acute Rehab Dept. Number: [facility] [id number] and [facility] [phone]

## 2020-11-06 NOTE — Evaluation (Signed)
Occupational Therapy Evaluation Patient Details Name: Katrina Chen MRN: [id number] DOB: [birth year] Today's Date: 11/06/2020    History of Present Illness Pt is a 85 y/o F With PMH:  bipolar, HTN, A-fib on chronic anticoagulation, status post pacemaker, diverticulosis, CAD, presents to the emergency department for chief concerns of hurting all over including chest pain.  Imaging significant for moderate stool burden with 6 cm fecal ball.  No evidence of obstruction. Pt adm with obstipation.   Clinical Impression   Pt seen for OT evaluation this date in setting of acute hospitalization d/t obstipation. Pt presents this date with abd pain and discomfort impacting her ability to safely and efficiently perform ADLs/ADL mobility. Pt and family report that at baseline, she is a resident at [facility] and she is able to perform ADL transfers with minimal assistance from one person with RW to [facility] or w/c, but primarily uses w/c for fxl mobility. OT assessment limited this date as pt received enema ~15-20 mins prior to session per RN. Pt demos ability to perform bed level ADLs with MAX A to TOTAL A, requires MOD/MAX A for bed level rolling, and SETUP to MIN A with bed level UB ADLs (in high-fowler's position). Pt left with all needs met and in reach. OT will continue to follow acutely. Anticipate pt could benefit from therapy f/u in SNF setting as family reports she has gradually become less mobile which has likely contributed to decreased GI function.     Follow Up Recommendations  SNF    Equipment Recommendations  Other (comment) (defer)    Recommendations for Other Services       Precautions / Restrictions Precautions Precautions: Fall Restrictions Weight Bearing Restrictions: No      Mobility Bed Mobility Overal bed mobility: Needs Assistance Bed Mobility: Rolling Rolling: Mod assist;Max assist         General bed mobility comments: cues to bend knees and reach and roll to contribute to  rolling    Transfers                 General transfer comment: deferred as pt recently recieved enema    Balance                                           ADL either performed or assessed with clinical judgement   ADL Overall ADL's : Needs assistance/impaired                                       General ADL Comments: Pt requires MOD/MAX A for bed level rolling, MAX A for LB ADLs bed level including dressing and peri care. Pt requires SETUP to MIN A for bed level UB ADLs (high-folwer's positiong).     Vision Patient Visual Report: No change from baseline       Perception     Praxis      Pertinent Vitals/Pain Pain Assessment: Faces Faces Pain Scale: Hurts even more Pain Location: abdomen/bottom Pain Descriptors / Indicators: Discomfort;Tightness Pain Intervention(s): Limited activity within patient's tolerance;Monitored during session     Hand Dominance     Extremity/Trunk Assessment Upper Extremity Assessment Upper Extremity Assessment: Generalized weakness   Lower Extremity Assessment Lower Extremity Assessment: Generalized weakness (h/o b/l knee replacements with limited flex/ext at baseline)  Communication     Cognition Arousal/Alertness: Awake/alert Behavior During Therapy: WFL for tasks assessed/performed Overall Cognitive Status: History of cognitive impairments - at baseline                                 General Comments: Pt oriented to "hospital" and month, but not which city, day/date. She is oriented to situation. Able to follow all basic one step commands. Generally pleasant.   General Comments       Exercises Other Exercises Other Exercises: OT ed re: role and benefits. Pt and family with some familiarity as pt is at [place] expects to be discharged to:: Skilled nursing facility ([facility])                                         Prior Functioning/Environment Level of Independence: Needs assistance  Gait / Transfers Assistance Needed: assist from one person to SPS from bed to w/c or BSC. ADL's / Homemaking Assistance Needed: Assist for bathing, dressing tasks. Pt is able to self feed and performing seated grooming tasks at baseline. All IADLs performed by facility.            OT Problem List: Decreased strength;Decreased activity tolerance      OT Treatment/Interventions: Self-care/ADL training;Therapeutic exercise;Therapeutic activities    OT Goals(Current goals can be found in the care plan section) Acute Rehab OT Goals Patient Stated Goal: to get stronger and generally at least slightly more active/mobile OT Goal Formulation: With patient/family Time For Goal Achievement: 11/20/20 Potential to Achieve Goals: Good ADL Goals Pt Will Perform Grooming: with supervision;sitting (EOB to complete 2-3 g/h tasks to improve OOB Activity tolerance) Pt Will Transfer to Toilet: with mod assist;stand pivot transfer;bedside commode (with RW) Pt/caregiver will Perform Home Exercise Program: Increased strength;Both right and left upper extremity;With Supervision  OT Frequency: Min 1X/week   Barriers to D/C:            Co-evaluation              AM-PAC OT "6 Clicks" Daily Activity     Outcome Measure Help from another person eating meals?: None Help from another person taking care of personal grooming?: A Little Help from another person toileting, which includes using toliet, bedpan, or urinal?: A Lot Help from another person bathing (including washing, rinsing, drying)?: A Lot Help from another person to put on and taking off regular upper body clothing?: A Lot Help from another person to put on and taking off regular lower body clothing?: Total 6 Click Score: 14   End of Session    Activity Tolerance: Other (comment) (limited d/t having recieved enema ~15-20 mins before session) Patient  left: in bed;with call bell/phone within reach;with bed alarm set  OT Visit Diagnosis: Unsteadiness on feet (R26.81);Muscle weakness (generalized) (M62.81)                Time: 8786-7672 OT Time Calculation (min): 14 min Charges:  OT General Charges $OT Visit: 1 Visit OT Evaluation $OT Eval Moderate Complexity: [place], [place], OTR/L ascom [phone] 11/06/20, 4:08 PM

## 2020-11-06 NOTE — ED Notes (Signed)
Checked pt brief. Dry with no stool present.  Offered pt remote and turned on tv for distraction.

## 2020-11-06 NOTE — Progress Notes (Signed)
PROGRESS NOTE    Tylia Ewell  HGD:[id number] DOB: Jan 23, 1936 DOA: 11/05/2020 PCP: Shawn Stall, MD   Brief Narrative:  85 y.o. female with medical history significant for history of bipolar, history of hypertension, chronic atrial fibrillation, CHA2DS2-VASc score of 4, on chronic anticoagulation, status post pacemaker, diverticulosis, CAD, presents to the emergency department for chief concerns of hurting all over including chest pain.   At bedside patient is awake and alert and oriented.  Weakly follows commands.  However she does not want to participate fully in physical exam.  She moans and groans.   Daughter reports that patient had a bowel movement 2 days ago. She lives [facility] facility[facility] and daughter does not know the consistency of her bowel movement last.  Imaging survey significant for moderate stool burden with 6 cm fecal ball.  No evidence of obstruction   Assessment & Plan:   Principal Problem:   Obstipation Active Problems:   Chest pain   CAD (coronary artery disease)   Hyperkalemia   History of bipolar disorder   Atrial fibrillation, chronic (HCC)   CKD (chronic kidney disease), stage III (HCC)   Frail elderly   Weakness   Protein-calorie malnutrition (HCC)  Diffuse pain Generalized weakness Suspected obstipation CT imaging survey with 6 cm stool ball left colon No evidence of obstruction Plan: Aggressive bowel regimen Therapy evaluations Ambulate Avoid narcotics  Chest pain Resolved Low suspicion for ACS Troponins flat  Hyperkalemia Improved after 1 dose Kayexalate  CKD stage IIIb Creatinine baseline  History of hypertension PTA Coreg, losartan As needed IV diltiazem  History of atrial fibrillation on Eliquis Rate controlled Resume anticoagulation  Anxiety/depression History of bipolar disorder PTA duloxetine PTA Xanax PTA Zyprexa  Diverticulosis No radiographic or clinical evidence of acute diverticulitis Outpatient  follow-up      DVT prophylaxis: Eliquis Code Status: DNR Family Communication: Daughter at bedside Disposition Plan: Status is: Observation  The patient will require care spanning > 2 midnights and should be moved to inpatient because: Ongoing active pain requiring inpatient pain management and Inpatient level of care appropriate due to severity of illness  Dispo: The patient is from: SNF              Anticipated d/c is to: SNF              Patient currently is not medically stable to d/c.   Difficult to place patient No  Patient still with significant and diffuse pain in the setting of suspected obstipation.  Needs to have bowel movement.  Can possibly discharge in 24 hours if symptoms improved.     Level of care: Med-Surg  Consultants:  None  Procedures:  None  Antimicrobials:  None   Subjective: Patient seen and examined.  Appears fatigued.  Poor historian but difficult ascertain specific complaints  Objective: Vitals:   11/06/20 0730 11/06/20 0800 11/06/20 0900 11/06/20 1319  BP: (!) 121/58 (!) 129/44 (!) 155/81 (!) 146/60  Pulse: 61 60 65 (!) 59  Resp:  20 16 15   Temp:    97.7 F (36.5 C)  TempSrc:    Oral  SpO2: 98% 96% 97% 92%  Weight:      Height:        Intake/Output Summary (Last 24 hours) at 11/06/2020 1406 Last data filed at 11/06/2020 1330 Gross per 24 hour  Intake --  Output 600 ml  Net -600 ml   Filed Weights   11/05/20 1333  Weight: 76.7 kg  Examination:  General exam: No acute distress.  Frail Respiratory system: Clear to auscultation. Respiratory effort normal. Cardiovascular system: S1 & S2 heard, RRR. No JVD, murmurs, rubs, gallops or clicks. No pedal edema. Gastrointestinal system: Soft, mild diffuse tenderness, hypoactive bowel sounds Central nervous system: Alert, oriented x2, no focal deficits Extremities: Symmetric 5 x 5 power. Skin: No rashes, lesions or ulcers Psychiatry: Judgement and insight appear normal. Mood &  affect appropriate.     Data Reviewed: I have personally reviewed following labs and imaging studies  CBC: Recent Labs  Lab 11/05/20 1340  WBC 4.8  HGB 12.7  HCT 40.0  MCV 87.1  PLT 267   Basic Metabolic Panel: Recent Labs  Lab 11/05/20 1340 11/06/20 0737  NA 131*  --   K 5.2*  --   CL 98  --   CO2 28  --   GLUCOSE 78  --   BUN 23  --   CREATININE 1.40*  --   CALCIUM 9.4  --   MG  --  2.6*   GFR: Estimated Creatinine Clearance: 32.5 mL/min (A) (by C-G formula based on SCr of 1.4 mg/dL (H)). Liver Function Tests: No results for input(s): AST, ALT, ALKPHOS, BILITOT, PROT, ALBUMIN in the last 168 hours. No results for input(s): LIPASE, AMYLASE in the last 168 hours. No results for input(s): AMMONIA in the last 168 hours. Coagulation Profile: No results for input(s): INR, PROTIME in the last 168 hours. Cardiac Enzymes: No results for input(s): CKTOTAL, CKMB, CKMBINDEX, TROPONINI in the last 168 hours. BNP (last 3 results) No results for input(s): PROBNP in the last 8760 hours. HbA1C: No results for input(s): HGBA1C in the last 72 hours. CBG: No results for input(s): GLUCAP in the last 168 hours. Lipid Profile: Recent Labs    11/06/20 0737  CHOL 172  HDL 48  LDLCALC 104*  TRIG 101  CHOLHDL 3.6   Thyroid Function Tests: Recent Labs    11/05/20 1535  TSH 0.548   Anemia Panel: No results for input(s): VITAMINB12, FOLATE, FERRITIN, TIBC, IRON, RETICCTPCT in the last 72 hours. Sepsis Labs: No results for input(s): PROCALCITON, LATICACIDVEN in the last 168 hours.  Recent Results (from the past 240 hour(s))  Resp Panel by RT-PCR (Flu A&B, Covid) Nasopharyngeal Swab     Status: None   Collection Time: 11/05/20  3:35 PM   Specimen: Nasopharyngeal Swab; Nasopharyngeal(NP) swabs in vial transport medium  Result Value Ref Range Status   SARS Coronavirus 2 by RT PCR NEGATIVE NEGATIVE Final    Comment: (NOTE) SARS-CoV-2 target nucleic acids are NOT  DETECTED.  The SARS-CoV-2 RNA is generally detectable in upper respiratory specimens during the acute phase of infection. The lowest concentration of SARS-CoV-2 viral copies this assay can detect is 138 copies/mL. A negative result does not preclude SARS-Cov-2 infection and should not be used as the sole basis for treatment or other patient management decisions. A negative result may occur with  improper specimen collection/handling, submission of specimen other than nasopharyngeal swab, presence of viral mutation(s) within the areas targeted by this assay, and inadequate number of viral copies(<138 copies/mL). A negative result must be combined with clinical observations, patient history, and epidemiological information. The expected result is Negative.  Fact Sheet for Patients:  BloggerCourse.com  Fact Sheet for Healthcare Providers:  SeriousBroker.it  This test is no t yet approved or cleared by the [place] FDA and  has been authorized for detection and/or diagnosis of SARS-CoV-2 by FDA under an  Emergency Use Authorization (EUA). This EUA will remain  in effect (meaning this test can be used) for the duration of the COVID-19 declaration under Section 564(b)(1) of the Act, 21 U.S.C.section 360bbb-3(b)(1), unless the authorization is terminated  or revoked sooner.       Influenza A by PCR NEGATIVE NEGATIVE Final   Influenza B by PCR NEGATIVE NEGATIVE Final    Comment: (NOTE) The Xpert Xpress SARS-CoV-2/FLU/RSV plus assay is intended as an aid in the diagnosis of influenza from Nasopharyngeal swab specimens and should not be used as a sole basis for treatment. Nasal washings and aspirates are unacceptable for Xpert Xpress SARS-CoV-2/FLU/RSV testing.  Fact Sheet for Patients: BloggerCourse.com  Fact Sheet for Healthcare Providers: SeriousBroker.it  This test is not yet  approved or cleared by the [place] FDA and has been authorized for detection and/or diagnosis of SARS-CoV-2 by FDA under an Emergency Use Authorization (EUA). This EUA will remain in effect (meaning this test can be used) for the duration of the COVID-19 declaration under Section 564(b)(1) of the Act, 21 U.S.C. section 360bbb-3(b)(1), unless the authorization is terminated or revoked.  Performed at [facility], [address] [place], [place] [address]          Radiology Studies: CT ABDOMEN PELVIS WO CONTRAST  Result Date: 11/05/2020 CLINICAL DATA:  Flank pain, kidney stone suspected Low back pain, increased fracture risk Back and flank pain, unknown cause. Denies falling, pt states "it feels like my back is breaking!" EXAM: CT ABDOMEN AND PELVIS WITHOUT CONTRAST CT LUMBAR SPINE WITHOUT CONTRAST TECHNIQUE: Multidetector CT imaging of the abdomen and pelvis was performed following the standard protocol without IV contrast. Multidetector CT imaging of the lumbar spine was performed without intravenous contrast administration. Multiplanar CT image reconstructions were also generated. COMPARISON:  None. FINDINGS: CT ABD/PELVIS Lower chest: Linear atelectasis versus scarring of the right base. Partially visualized cardiac lead. Otherwise no acute abnormality. Hepatobiliary: No focal liver abnormality. No gallstones, gallbladder wall thickening, or pericholecystic fluid. No biliary dilatation. Pancreas: No focal lesion. Normal pancreatic contour. No surrounding inflammatory changes. No main pancreatic ductal dilatation. Spleen: Normal in size without focal abnormality. Adrenals/Urinary Tract: No adrenal nodule bilaterally. No nephrolithiasis and no hydronephrosis. No definite contour-deforming renal mass. No ureterolithiasis or hydroureter. The urinary bladder is distended with urine and grossly unremarkable. Stomach/Bowel: Stomach is within normal limits. No evidence of bowel wall  thickening or dilatation. Stool throughout the left colon. Diffuse sigmoid diverticulosis. No pneumatosis. Appendix appears normal. Vascular/Lymphatic: No abdominal aorta or iliac aneurysm. Severe atherosclerotic plaque of the aorta and its branches. No abdominal, pelvic, or inguinal lymphadenopathy. Reproductive: Status post hysterectomy. No adnexal masses. Other: No intraperitoneal free fluid. No intraperitoneal free gas. No organized fluid collection. Musculoskeletal: No abdominal wall hernia or abnormality. No suspicious lytic or blastic osseous lesions. No acute displaced fracture. CT LUMBAR Segmentation: 5 non rib-bearing lumbar report T Wo bodies are identified Alignment: Straightening of the normal lumbar lordosis likely due to positioning and surgical hardware. Vertebrae: Status post L3 through S1 posterolateral and interbody fusion. Status post laminectomy at these levels. Multilevel degenerative changes of the spine. No acute fracture or focal pathologic process. No suspicious lytic or blastic osseous lesions. There is a densely sclerotic lesion within the right iliac bone that may represent a bone island. Well-defined lytic lesion with a narrow zone of transmission within the right iliac bone along the sacroiliac joint is likely of benign etiology. Paraspinal and other soft tissues: Negative. IMPRESSION: 1. No  acute intra-abdominal or intrapelvic abnormality with limited evaluation on this noncontrast study. 2. Diffuse sigmoid diverticulosis with no acute diverticulitis. 3. Stool throughout the left colon. 4. No acute displaced fracture or traumatic listhesis of the lumbar spine in a patient status post L3 through S1 posterolateral and interbody fusion. 5. A densely sclerotic lesion within the right iliac bone may represent a bone island. Comparison with prior imaging would be of value. 6.  Aortic Atherosclerosis (ICD10-I70.0)  - severe. Electronically Signed   By: Tish FredericksonMorgane  Naveau M.D.   On: 11/05/2020  18:19   DG Chest 2 View  Result Date: 11/05/2020 CLINICAL DATA:  Chest tightness for 3 days EXAM: CHEST - 2 VIEW COMPARISON:  None. FINDINGS: Frontal and lateral views of the chest demonstrate an unremarkable cardiac silhouette. Loop recorder is seen within the left anterior chest. No airspace disease, effusion, or pneumothorax. No acute bony abnormalities. IMPRESSION: 1. No acute intrathoracic process. Electronically Signed   By: Sharlet SalinaMichael  Brown M.D.   On: 11/05/2020 15:42   CT HEAD WO CONTRAST (5MM)  Result Date: 11/05/2020 CLINICAL DATA:  Mental status change. Unknown cause. Pain all over. Chest tightness. EXAM: CT HEAD WITHOUT CONTRAST TECHNIQUE: Contiguous axial images were obtained from the base of the skull through the vertex without intravenous contrast. COMPARISON:  None. BRAIN: Atherosclerotic calcifications are present within the cavernous internal carotid arteries. No evidence of large-territorial acute infarction. No parenchymal hemorrhage. No mass lesion. No extra-axial collection. No mass effect or midline shift. No hydrocephalus. Basilar cisterns are patent. Vascular: No hyperdense vessel. Skull: No acute fracture or focal lesion. Sinuses/Orbits: Paranasal sinuses and mastoid air cells are clear. Bilateral lens replacement. Otherwise orbits are unremarkable. Other: None. IMPRESSION: No acute intracranial abnormality. Electronically Signed   By: Tish FredericksonMorgane  Naveau M.D.   On: 11/05/2020 18:07   CT L-SPINE NO CHARGE  Result Date: 11/05/2020 CLINICAL DATA:  Flank pain, kidney stone suspected Low back pain, increased fracture risk Back and flank pain, unknown cause. Denies falling, pt states "it feels like my back is breaking!" EXAM: CT ABDOMEN AND PELVIS WITHOUT CONTRAST CT LUMBAR SPINE WITHOUT CONTRAST TECHNIQUE: Multidetector CT imaging of the abdomen and pelvis was performed following the standard protocol without IV contrast. Multidetector CT imaging of the lumbar spine was performed without  intravenous contrast administration. Multiplanar CT image reconstructions were also generated. COMPARISON:  None. FINDINGS: CT ABD/PELVIS Lower chest: Linear atelectasis versus scarring of the right base. Partially visualized cardiac lead. Otherwise no acute abnormality. Hepatobiliary: No focal liver abnormality. No gallstones, gallbladder wall thickening, or pericholecystic fluid. No biliary dilatation. Pancreas: No focal lesion. Normal pancreatic contour. No surrounding inflammatory changes. No main pancreatic ductal dilatation. Spleen: Normal in size without focal abnormality. Adrenals/Urinary Tract: No adrenal nodule bilaterally. No nephrolithiasis and no hydronephrosis. No definite contour-deforming renal mass. No ureterolithiasis or hydroureter. The urinary bladder is distended with urine and grossly unremarkable. Stomach/Bowel: Stomach is within normal limits. No evidence of bowel wall thickening or dilatation. Stool throughout the left colon. Diffuse sigmoid diverticulosis. No pneumatosis. Appendix appears normal. Vascular/Lymphatic: No abdominal aorta or iliac aneurysm. Severe atherosclerotic plaque of the aorta and its branches. No abdominal, pelvic, or inguinal lymphadenopathy. Reproductive: Status post hysterectomy. No adnexal masses. Other: No intraperitoneal free fluid. No intraperitoneal free gas. No organized fluid collection. Musculoskeletal: No abdominal wall hernia or abnormality. No suspicious lytic or blastic osseous lesions. No acute displaced fracture. CT LUMBAR Segmentation: 5 non rib-bearing lumbar report T Wo bodies are identified Alignment: Straightening of the normal  lumbar lordosis likely due to positioning and surgical hardware. Vertebrae: Status post L3 through S1 posterolateral and interbody fusion. Status post laminectomy at these levels. Multilevel degenerative changes of the spine. No acute fracture or focal pathologic process. No suspicious lytic or blastic osseous lesions. There  is a densely sclerotic lesion within the right iliac bone that may represent a bone island. Well-defined lytic lesion with a narrow zone of transmission within the right iliac bone along the sacroiliac joint is likely of benign etiology. Paraspinal and other soft tissues: Negative. IMPRESSION: 1. No acute intra-abdominal or intrapelvic abnormality with limited evaluation on this noncontrast study. 2. Diffuse sigmoid diverticulosis with no acute diverticulitis. 3. Stool throughout the left colon. 4. No acute displaced fracture or traumatic listhesis of the lumbar spine in a patient status post L3 through S1 posterolateral and interbody fusion. 5. A densely sclerotic lesion within the right iliac bone may represent a bone island. Comparison with prior imaging would be of value. 6.  Aortic Atherosclerosis (ICD10-I70.0)  - severe. Electronically Signed   By: Tish Frederickson M.D.   On: 11/05/2020 18:19   CT Angio Chest/Abd/Pel for Dissection W and/or Wo Contrast  Result Date: 11/05/2020 CLINICAL DATA:  Abdominal pain, aortic dissection suspected EXAM: CT ANGIOGRAPHY CHEST, ABDOMEN AND PELVIS TECHNIQUE: Non-contrast CT of the chest was initially obtained. Multidetector CT imaging through the chest, abdomen and pelvis was performed using the standard protocol during bolus administration of intravenous contrast. Multiplanar reconstructed images and MIPs were obtained and reviewed to evaluate the vascular anatomy. CONTRAST:  75mL OMNIPAQUE IOHEXOL 350 MG/ML SOLN COMPARISON:  None. FINDINGS: CTA CHEST FINDINGS Cardiovascular: Preferential opacification of the thoracic aorta. No evidence of thoracic aortic aneurysm or dissection. Moderate atherosclerotic plaque of the thoracic aorta. Four-vessel coronary artery calcifications. Normal heart size. No significant pericardial effusion. The main pulmonary artery is normal in caliber. No central pulmonary embolus. Mediastinum/Nodes: No enlarged mediastinal, hilar, or axillary  lymph nodes. Multiple hypodensities within thyroid glands. There is a 1.9 cm right thyroid gland hypodense nodule. The trachea and esophagus demonstrate no significant findings. Lungs/Pleura: No focal consolidation. No pulmonary nodule. No pulmonary mass. No pleural effusion. No pneumothorax. Musculoskeletal: No chest wall abnormality. No suspicious lytic or blastic osseous lesions. No acute displaced fracture. Multilevel mild degenerative changes of the spine. Review of the MIP images confirms the above findings. CTA ABDOMEN AND PELVIS FINDINGS VASCULAR Aorta: Severe calcified and noncalcified atherosclerotic plaque. Normal caliber aorta without aneurysm, dissection, vasculitis. Celiac: At least mild atherosclerotic plaque. At least mild stenosis of the origin of the celiac artery. Patent without evidence of aneurysm, dissection, vasculitis or significant stenosis. SMA: At least mild atherosclerotic plaque. Patent without evidence of aneurysm, dissection, vasculitis or significant stenosis. Renals: Atherosclerotic plaque moderate severe stenosis at the origin of bilateral renal arteries. Opacification of the vessels distally is noted. Both renal arteries are patent without evidence of aneurysm, dissection, vasculitis, fibromuscular dysplasia or significant stenosis. IMA: Atherosclerotic plaque. 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 Opacification of the inferior mesenteric artery is noted distally. Otherwise patent without evidence of aneurysm, dissection, vasculitis or significant stenosis. Inflow: Patent without evidence of aneurysm, dissection, vasculitis or significant stenosis. Veins: No obvious venous abnormality within the limitations of this arterial phase study. Review of the MIP images confirms the above findings. NON-VASCULAR Hepatobiliary: No focal liver abnormality. No gallstones, gallbladder wall thickening, or pericholecystic fluid. No biliary  dilatation. Pancreas: No focal lesion. Normal pancreatic contour. No surrounding  inflammatory changes. No main pancreatic ductal dilatation. Spleen: Normal in size without focal abnormality. Adrenals/Urinary Tract: No adrenal nodule bilaterally. Bilateral kidneys enhance symmetrically. 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 No hydronephrosis. No hydroureter. The urinary bladder is distended with urine. Stomach/Bowel: Stomach is within normal limits. No evidence of bowel wall thickening or dilatation. Mid rectal stool ball of 6 cm. Stool noted throughout the left colon. Diffuse sigmoid diverticulosis. No pneumatosis. Appendix appears normal. Lymphatic: No lymphadenopathy. Reproductive: Status post hysterectomy. No adnexal masses. Other: No intraperitoneal free fluid. No intraperitoneal free gas. No organized fluid collection. Musculoskeletal: No abdominal wall hernia or abnormality. No suspicious lytic or blastic osseous lesions. No acute displaced fracture. Status post L3 through S1 posterolateral and interbody usion. Status post laminectomy at these levels. Multilevel dgenerative changes of the spine. Review of the MIP images confirms the above findings. IMPRESSION: 1. No acute aortic abnormality. 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 2. No central pulmonary embolus. 3. No acute intrathoracic, intra-abdominal, intrapelvic abnormality. 4. Stool throughout the left colon with a 6 cm stool ball within the mid rectum. Diffuse sigmoid diverticulosis with no acute diverticulitis. 5. A 1.9 cm right thyroid gland nodule Recommend thyroid US (ref: J Am Coll Radiol. 2015 Feb;12(2): 143-50). In the setting of significant comorbidities or limited life expectancy, no follow-up recommended (ref: J Am Coll Radiol. 2015  Feb;12(2): 143-50). Electronically Signed   By: Tish Frederickson M.D.   On: 11/05/2020 20:20        Scheduled Meds:  apixaban  5 mg Oral BID   LORazepam  1 mg Intravenous Once   polyethylene glycol  17 g Oral Daily   senna-docusate  1 tablet Oral BID   Continuous Infusions:   LOS: 0 days    Time spent: 25 minutes    Tresa Moore, MD [facility] Pager [phone]  If 7PM-7AM, please contact night-coverage 11/06/2020, 2:06 PM

## 2020-11-06 NOTE — Evaluation (Signed)
Clinical/Bedside Swallow Evaluation Patient Details  Name: Katrina Chen MRN: [id number] Date of Birth: October 07, 1935  Today's Date: 11/06/2020 Time: SLP Start Time (ACUTE ONLY): 1020 SLP Stop Time (ACUTE ONLY): 1120 SLP Time Calculation (min) (ACUTE ONLY): 60 min  Past Medical History:  Past Medical History:  Diagnosis Date   Hypertension    Past Surgical History: History reviewed. No pertinent surgical history. HPI:  Pt is a 85 y.o. female with medical history significant for history of bipolar, history of hypertension, chronic atrial fibrillation, CHA2DS2-VASc score of 4, on chronic anticoagulation, status post pacemaker, diverticulosis, CAD, Protein/calorie malnutrition-mild to moderate, presents to the emergency department for chief concerns of hurting all over including chest pain.  In the ED, pt was awake and alert and oriented to self.  Weakly follows commands.  However, she did not participate fully in physical exam often moaning and groaning.  Unsure of pt's Baseline Cognitive status.  CXR: No acute intrathoracic process.  Pt resides at [facility].  Pt was admitted for Obstipation.   Assessment / Plan / Recommendation Clinical Impression  Pt appears to present w/ adequate oropharyngeal phase swallow function w/ No overt oropharyngeal phase dysphagia noted, No neuromuscular deficits noted. Pt consumed po trials given w/ No overt, clinical s/s of aspiration during po trials. Pt appears at reduced risk for aspiration following general aspiration precautions and modifying the solid foods in the diet slightly; moistening foods. Pt is Edentulous Baseline. Pt also demonstrated Mild Confusion during engagement w/ the po trials -- recommend reducing Distractions during meals and swallowing of Pills to allow for full attention to po tasks.     During po trials, pt consumed all consistencies w/ no overt coughing, decline in vocal quality, or change in respiratory presentation during/post trials.  Oral phase appeared [facility] w/ timely bolus management, mashing/gumming, and control of bolus propulsion for A-P transfer for swallowing. Oral clearing achieved w/ all trial consistencies given min Time. OM Exam appeared [facility] w/ no unilateral weakness noted. Speech Clear. Pt attempted to hold Cup to feed self but need Mod verbal/tactile cues.    Recommend a more Mech Soft-regular consistency diet w/ well-Cut meats, moistened foods; Thin liquids. Recommend general aspiration precautions, Pills WHOLE in Puree for safer, easier swallowing in light of any Confusion. Education given on Pills in Puree; food consistencies and easy to eat options; general aspiration precautions. NSG to reconsult if any new needs arise. NSG/MD updated, agreed. SLP Visit Diagnosis: Dysphagia, unspecified (R13.10) (Edentulous; mild confusion)    Aspiration Risk  Mild aspiration risk;Risk for inadequate nutrition/hydration (reduced following general precautions and tray setup, support at meals)    Diet Recommendation   Mech Soft-regular consistency diet w/ well-Cut meats, moistened foods; Thin liquids. Recommend general aspiration precautions and Reducing Distractions at meals. Support at meals as needed.   Medication Administration: Whole meds with puree (for safer, easier swallowing) and to reduce any Confusion when swallowing Pills   Other  Recommendations Recommended Consults:  (Dietician f/u for support) Oral Care Recommendations: Oral care BID;Oral care before and after PO;Staff/trained caregiver to provide oral care Other Recommendations:  (n/a)   Follow up Recommendations None      Frequency and Duration  (n/a)   (n/a)       Prognosis Prognosis for Safe Diet Advancement:  (n/a)      Swallow Study   General Date of Onset: 11/05/20 HPI: Pt is a 85 y.o. female with medical history significant for history of bipolar, history of hypertension, chronic atrial  fibrillation, CHA2DS2-VASc score of 4, on chronic  anticoagulation, status post pacemaker, diverticulosis, CAD, Protein/calorie malnutrition-mild to moderate, presents to the emergency department for chief concerns of hurting all over including chest pain.  In the ED, pt was awake and alert and oriented to self.  Weakly follows commands.  However, she did not participate fully in physical exam often moaning and groaning.  Unsure of pt's Baseline Cognitive status.  CXR: No acute intrathoracic process.  Pt resides at [facility].  Pt was admitted for Obstipation. Type of Study: Bedside Swallow Evaluation Previous Swallow Assessment: none Diet Prior to this Study: Regular;Thin liquids Temperature Spikes Noted: No (wbc 4.8) Respiratory Status: Room air History of Recent Intubation: No Behavior/Cognition: Alert;Cooperative;Pleasant mood;Confused;Distractible;Requires cueing Oral Cavity Assessment: Dry Oral Care Completed by SLP: Yes Oral Cavity - Dentition: Edentulous Vision: Functional for self-feeding Self-Feeding Abilities: Able to feed self;Needs assist;Needs set up;Total assist (Cognitive decline) Patient Positioning: Upright in bed (needed positioning) Baseline Vocal Quality: Low vocal intensity (mumbled speech at times - edentulous) Volitional Cough: Strong Volitional Swallow: Able to elicit    Oral/Motor/Sensory Function Overall Oral Motor/Sensory Function: Within functional limits   Ice Chips Ice chips: Within functional limits Presentation: Spoon (fed; 5 trials)   Thin Liquid Thin Liquid: Within functional limits Presentation: Self Fed;Straw (supported fully; ~3-4 ozs)    Nectar Thick Nectar Thick Liquid: Not tested   Honey Thick Honey Thick Liquid: Not tested   Puree Puree: Within functional limits Presentation: Spoon (fed; ~3 ozs)   Solid     Solid: Impaired Presentation: Spoon (fed; 5 trials) Oral Phase Impairments: Impaired mastication (edentulous) Pharyngeal Phase Impairments:  (none)        Jerilynn Som,  MS, [employer] Speech Language Pathologist Rehab Services [phone] [facility] 11/06/2020,12:42 PM

## 2020-11-06 NOTE — Progress Notes (Signed)
Pain crying out in pain.  3 hours post-fleet enema, patient had not past any stool.   Assess rectal area- small amount of bowel noted stuck to anus.  Cleaned patient and removed 3 small clumps of stool.    Paged MD to let him know.  Due to patient pain, was going to give morphine, however family willing to try SMOG enema prior to pain medication.   MD order SMOG enema.

## 2020-11-06 NOTE — ED Notes (Addendum)
Pt presents to the unit c/o back pain.  Pt and daughter updated on plan of care.  Per previous RN, Pt had difficulty swallowing during the night and will be kept NPO until PT/OT eval.

## 2020-11-06 NOTE — ED Notes (Signed)
Report received from Melissa, RN.

## 2020-11-06 NOTE — Progress Notes (Signed)
Patient tolerated SMOG enema well.  1 very large portion of stool removed (length of my hand) with enema along with several small clumps.  Stool was hard, brown/clay like.    Cleaned patient up and explained will continue to monitor, may need another due to amount of stool burden.   Patient resting comfortable in bed post enema

## 2020-11-06 NOTE — ED Notes (Addendum)
Called this RN, daughter requesting PRN anxiety medication at this time. Admitting MD made aware.

## 2020-11-06 NOTE — ED Notes (Signed)
Dietary called to take lunch to room 245.

## 2020-11-06 NOTE — ED Notes (Signed)
Family at bedside requesting pain medications and something to drink at this time. Explained to pt and family that pt was having difficulty swallowing during the night and that we should hold off her having this PO until PT/OT see her. Family verbalized understanding.

## 2020-11-06 NOTE — ED Notes (Signed)
Speech therapy at bedside.  Therapist feels medication can be given in apple sauce.  Will Senokot once therapist is finished w/ Pt.

## 2020-11-06 NOTE — ED Notes (Signed)
Lab at bedside at this time.  

## 2020-11-07 ENCOUNTER — Observation Stay: Payer: Medicare Other

## 2020-11-07 DIAGNOSIS — Z7901 Long term (current) use of anticoagulants: Secondary | ICD-10-CM | POA: Diagnosis not present

## 2020-11-07 DIAGNOSIS — Y92129 Unspecified place in nursing home as the place of occurrence of the external cause: Secondary | ICD-10-CM | POA: Diagnosis not present

## 2020-11-07 DIAGNOSIS — Z950 Presence of cardiac pacemaker: Secondary | ICD-10-CM | POA: Diagnosis not present

## 2020-11-07 DIAGNOSIS — E875 Hyperkalemia: Secondary | ICD-10-CM | POA: Diagnosis present

## 2020-11-07 DIAGNOSIS — Z91041 Radiographic dye allergy status: Secondary | ICD-10-CM | POA: Diagnosis not present

## 2020-11-07 DIAGNOSIS — M545 Low back pain, unspecified: Secondary | ICD-10-CM | POA: Diagnosis present

## 2020-11-07 DIAGNOSIS — R54 Age-related physical debility: Secondary | ICD-10-CM | POA: Diagnosis present

## 2020-11-07 DIAGNOSIS — I2510 Atherosclerotic heart disease of native coronary artery without angina pectoris: Secondary | ICD-10-CM | POA: Diagnosis present

## 2020-11-07 DIAGNOSIS — Z66 Do not resuscitate: Secondary | ICD-10-CM | POA: Diagnosis present

## 2020-11-07 DIAGNOSIS — I129 Hypertensive chronic kidney disease with stage 1 through stage 4 chronic kidney disease, or unspecified chronic kidney disease: Secondary | ICD-10-CM | POA: Diagnosis present

## 2020-11-07 DIAGNOSIS — T40605A Adverse effect of unspecified narcotics, initial encounter: Secondary | ICD-10-CM | POA: Diagnosis present

## 2020-11-07 DIAGNOSIS — Z8616 Personal history of COVID-19: Secondary | ICD-10-CM | POA: Diagnosis not present

## 2020-11-07 DIAGNOSIS — E785 Hyperlipidemia, unspecified: Secondary | ICD-10-CM | POA: Diagnosis present

## 2020-11-07 DIAGNOSIS — K5900 Constipation, unspecified: Secondary | ICD-10-CM | POA: Diagnosis not present

## 2020-11-07 DIAGNOSIS — F419 Anxiety disorder, unspecified: Secondary | ICD-10-CM | POA: Diagnosis present

## 2020-11-07 DIAGNOSIS — Z91013 Allergy to seafood: Secondary | ICD-10-CM | POA: Diagnosis not present

## 2020-11-07 DIAGNOSIS — K5903 Drug induced constipation: Secondary | ICD-10-CM | POA: Diagnosis present

## 2020-11-07 DIAGNOSIS — E46 Unspecified protein-calorie malnutrition: Secondary | ICD-10-CM | POA: Diagnosis present

## 2020-11-07 DIAGNOSIS — N1832 Chronic kidney disease, stage 3b: Secondary | ICD-10-CM | POA: Diagnosis present

## 2020-11-07 DIAGNOSIS — Z79899 Other long term (current) drug therapy: Secondary | ICD-10-CM | POA: Diagnosis not present

## 2020-11-07 DIAGNOSIS — I482 Chronic atrial fibrillation, unspecified: Secondary | ICD-10-CM | POA: Diagnosis present

## 2020-11-07 DIAGNOSIS — Z6826 Body mass index (BMI) 26.0-26.9, adult: Secondary | ICD-10-CM | POA: Diagnosis not present

## 2020-11-07 DIAGNOSIS — E041 Nontoxic single thyroid nodule: Secondary | ICD-10-CM | POA: Diagnosis present

## 2020-11-07 DIAGNOSIS — F319 Bipolar disorder, unspecified: Secondary | ICD-10-CM | POA: Diagnosis present

## 2020-11-07 DIAGNOSIS — Z20822 Contact with and (suspected) exposure to covid-19: Secondary | ICD-10-CM | POA: Diagnosis present

## 2020-11-07 MED ORDER — GABAPENTIN 100 MG PO CAPS
100.0000 mg | ORAL_CAPSULE | Freq: Every day | ORAL | Status: DC
  Administered 2020-11-07: 100 mg via ORAL
  Filled 2020-11-07: qty 1

## 2020-11-07 MED ORDER — MORPHINE SULFATE (PF) 2 MG/ML IV SOLN: 1.0000 mg | INTRAVENOUS | Status: DC | PRN

## 2020-11-07 MED ORDER — OXYCODONE HCL 5 MG PO TABS
5.0000 mg | ORAL_TABLET | ORAL | Status: DC | PRN
  Administered 2020-11-07: 5 mg via ORAL
  Filled 2020-11-07: qty 1

## 2020-11-07 MED ORDER — MELATONIN 5 MG PO TABS
10.0000 mg | ORAL_TABLET | Freq: Every day | ORAL | Status: DC
  Administered 2020-11-07: 10 mg via ORAL
  Filled 2020-11-07: qty 2

## 2020-11-07 MED ORDER — LORATADINE 10 MG PO TABS: 10.0000 mg | ORAL_TABLET | Freq: Every day | ORAL | Status: DC

## 2020-11-07 MED ORDER — BOOST / RESOURCE BREEZE PO LIQD CUSTOM
1.0000 | Freq: Three times a day (TID) | ORAL | Status: DC
  Administered 2020-11-07 – 2020-11-08 (×5): 1 via ORAL

## 2020-11-07 MED ORDER — APIXABAN 2.5 MG PO TABS
2.5000 mg | ORAL_TABLET | Freq: Two times a day (BID) | ORAL | Status: DC
  Administered 2020-11-07 – 2020-11-08 (×2): 2.5 mg via ORAL
  Filled 2020-11-07 (×2): qty 1

## 2020-11-07 MED ORDER — CELECOXIB 100 MG PO CAPS
100.0000 mg | ORAL_CAPSULE | Freq: Two times a day (BID) | ORAL | Status: DC
  Administered 2020-11-07 – 2020-11-08 (×2): 100 mg via ORAL
  Filled 2020-11-07 (×3): qty 1

## 2020-11-07 MED ORDER — CARVEDILOL 12.5 MG PO TABS
12.5000 mg | ORAL_TABLET | Freq: Two times a day (BID) | ORAL | Status: DC
  Administered 2020-11-07 – 2020-11-08 (×2): 12.5 mg via ORAL
  Filled 2020-11-07 (×2): qty 1

## 2020-11-07 MED ORDER — OLANZAPINE 2.5 MG PO TABS
2.5000 mg | ORAL_TABLET | Freq: Every day | ORAL | Status: DC
  Administered 2020-11-07: 2.5 mg via ORAL
  Filled 2020-11-07 (×2): qty 1

## 2020-11-07 MED ORDER — SORBITOL 70 % SOLN
960.0000 mL | TOPICAL_OIL | Freq: Once | ORAL | Status: AC
  Administered 2020-11-07: 960 mL via RECTAL
  Filled 2020-11-07: qty 473

## 2020-11-07 MED ORDER — DICLOFENAC SODIUM 1 % EX GEL
2.0000 g | Freq: Four times a day (QID) | CUTANEOUS | Status: DC
  Administered 2020-11-07 – 2020-11-08 (×3): 2 g via TOPICAL
  Filled 2020-11-07: qty 100

## 2020-11-07 MED ORDER — LIDOCAINE 5 % EX PTCH
1.0000 | MEDICATED_PATCH | CUTANEOUS | Status: DC
  Administered 2020-11-07: 1 via TRANSDERMAL
  Filled 2020-11-07 (×3): qty 1

## 2020-11-07 MED ORDER — TRAMADOL HCL 50 MG PO TABS
50.0000 mg | ORAL_TABLET | Freq: Four times a day (QID) | ORAL | Status: DC | PRN
  Administered 2020-11-07 – 2020-11-08 (×2): 50 mg via ORAL
  Filled 2020-11-07 (×2): qty 1

## 2020-11-07 MED ORDER — OLOPATADINE HCL 0.1 % OP SOLN
1.0000 [drp] | Freq: Two times a day (BID) | OPHTHALMIC | Status: DC
  Administered 2020-11-07 – 2020-11-08 (×2): 1 [drp] via OPHTHALMIC
  Filled 2020-11-07: qty 5

## 2020-11-07 MED ORDER — CLONIDINE HCL 0.1 MG PO TABS
0.1000 mg | ORAL_TABLET | Freq: Two times a day (BID) | ORAL | Status: DC
  Administered 2020-11-07 – 2020-11-08 (×2): 0.1 mg via ORAL
  Filled 2020-11-07 (×2): qty 1

## 2020-11-07 MED ORDER — LOSARTAN POTASSIUM 50 MG PO TABS
100.0000 mg | ORAL_TABLET | Freq: Every day | ORAL | Status: DC
  Administered 2020-11-07 – 2020-11-08 (×2): 100 mg via ORAL
  Filled 2020-11-07 (×3): qty 2

## 2020-11-07 MED ORDER — DULOXETINE HCL 30 MG PO CPEP
60.0000 mg | ORAL_CAPSULE | Freq: Two times a day (BID) | ORAL | Status: DC
  Administered 2020-11-07 – 2020-11-08 (×2): 60 mg via ORAL
  Filled 2020-11-07 (×2): qty 2

## 2020-11-07 NOTE — Progress Notes (Signed)
Initial Nutrition Assessment  INTERVENTION:   -Boost Breeze po TID, each supplement provides 250 kcal and 9 grams of protein  NUTRITION DIAGNOSIS:   Inadequate oral intake related to lethargy/confusion, constipation as evidenced by per patient/family report.  GOAL:   Patient will meet greater than or equal to 90% of their needs  MONITOR:   PO intake, Supplement acceptance, Labs, Weight trends, I & O's  REASON FOR ASSESSMENT:   Consult Assessment of nutrition requirement/status  ASSESSMENT:   85 y.o. female with medical history significant for history of bipolar, history of hypertension, chronic atrial fibrillation, CHA2DS2-VASc score of 4, on chronic anticoagulation, status post pacemaker, diverticulosis, CAD, presents to the emergency department for chief concerns of hurting all over including chest pain.  Patient currently alert/oriented x2. Pt has had increased confusion and has been c/o pain for 3 days PTA. Pt has been eating poorly during this time. Requires chopped meats.  Consumed 30% of lunch yesterday. No other POs documented. Will order Boost Breeze supplements for additional kcals and protein.  Per weight records in care everywhere, pt weighed 179 lbs on 4/26. Pt has had 10 lbs of weight loss since which insignificant for time frame.   Medications: Miralax, Senokot  Labs reviewed:  Elevated Mg (2.6)  NUTRITION - FOCUSED PHYSICAL EXAM:  Unable to complete -RD remote  Diet Order:   Diet Order             Diet Heart Room service appropriate? Yes with Assist; Fluid consistency: Thin  Diet effective now                   EDUCATION NEEDS:   No education needs have been identified at this time  Skin:  Skin Assessment: Reviewed RN Assessment  Last BM:  8/27 -type 1&7 -following enema  Height:   Ht Readings from Last 1 Encounters:  11/05/20 5\' 7"  (1.702 m)    Weight:   Wt Readings from Last 1 Encounters:  11/05/20 76.7 kg    BMI:  Body mass  index is 26.47 kg/m.  Estimated Nutritional Needs:   Kcal:  1600-1800  Protein:  65-75g  Fluid:  1.8L/day  11/07/20, MS, RD, LDN Inpatient Clinical Dietitian Available via secure chat until 4pm Contact information available via Amion

## 2020-11-07 NOTE — Plan of Care (Signed)

## 2020-11-07 NOTE — TOC Progression Note (Addendum)
Transition of Care [facility]) - Progression Note    Patient Details  Name: Katrina Chen MRN: [id number] Date of Birth: 30-May-1935  [facility] of [facility]) CM/SW Contact  Bing Quarry, RN Phone Number: 11/07/2020, 3:22 PM  Clinical Narrative:  Attempted to contact [facility] regarding return to facility. Left VM with [name] weekend [facility] at [phone]. Update provider. Will look towards DC Monday 11/08/20. Gabriel Cirri RN CM   [name] left message she would look at this Monday am. Provider updated. Gabriel Cirri RN CM       Expected Discharge Plan and Services                                                 Social Determinants of Health (SDOH) Interventions    Readmission Risk Interventions No flowsheet data found.

## 2020-11-07 NOTE — Evaluation (Signed)
Physical Therapy Evaluation Patient Details Name: Katrina Chen MRN: [id number] DOB: [birth year] Today's Date: 11/07/2020   History of Present Illness  Pt is a 85 y/o F With PMH:  bipolar, HTN, A-fib on chronic anticoagulation, status post pacemaker, diverticulosis, CAD, presents to the emergency department for chief concerns of hurting all over including chest pain.  Imaging significant for moderate stool burden with 6 cm fecal ball.  No evidence of obstruction. Pt adm with obstipation.   Clinical Impression  Pt received supine in bed, agreeable to therapy. Pt arrived from [facility]; she and her daughter would like for her to return to [facility] with additional physical therapy upon return. She performs stand-pivot transfers at baseline. Pt states she would like to sit in the recliner. Pt performed bed mobility with MOD-MAX A. Pt attempted STS x3 reps using RW and MAX A with either bilateral knees buckling or pain with knee block - both preventing a full stand. PT deemed transfer unsafe without assist of second person. RN entered room stating pt would be receiving an enema soon. Pt returned to bed. PT did educate daughter and RN that pt sat EOB well with no LOB - with SUP in room, pt is safe to sit EOB for meals. Pt demo decreased strength, ability to transfer, functional mobility compared to baseline. Would benefit from skilled PT to address above deficits and promote optimal return to PLOF.      Follow Up Recommendations SNF (return to [facility] with STR)    Equipment Recommendations  None recommended by PT    Recommendations for Other Services OT consult     Precautions / Restrictions Precautions Precautions: Fall Restrictions Weight Bearing Restrictions: No      Mobility  Bed Mobility Overal bed mobility: Needs Assistance Bed Mobility: Supine to Sit;Sit to Supine Rolling: Mod assist   Supine to sit: Mod assist;HOB elevated Sit to supine: Max assist   General bed mobility comments: Pt  managed BLE, PT assisted with trunk lift. VC on sequencing. MAX A to return to bed - PT managed BLE and partial trunk. Bilateral rolling to place clean chuck pad.    Transfers Overall transfer level: Needs assistance Equipment used: Rolling walker (2 wheeled) Transfers: Sit to/from Stand Sit to Stand: Max assist;From elevated surface         General transfer comment: MAX A using RW from elevated surface to lift, block knees and steady. 3 attempts - pt reported pain with knee block. Pt was unable to attain a full stand. Transfer to chair deferred.  Ambulation/Gait                Stairs            Wheelchair Mobility    Modified Rankin (Stroke Patients Only)       Balance Overall balance assessment: Needs assistance Sitting-balance support: No upper extremity supported;Feet supported Sitting balance-Leahy Scale: Fair Sitting balance - Comments: Able to maintain sitting EOB with SUP for safety Postural control: Posterior lean Standing balance support: Bilateral upper extremity supported;During functional activity Standing balance-Leahy Scale: Zero Standing balance comment: Unable to attain balance during STS transfer with posterior LOB                             Pertinent Vitals/Pain Pain Assessment: Faces Faces Pain Scale: Hurts even more Pain Location: bilateral legs and head Pain Descriptors / Indicators: Discomfort;Aching Pain Intervention(s): Limited activity within patient's tolerance;Monitored during session;Repositioned;Patient requesting  pain meds-RN notified    Home Living Family/patient expects to be discharged to:: Skilled nursing facility ([facility])                      Prior Function Level of Independence: Needs assistance   Gait / Transfers Assistance Needed: assist from one person to stand pivot using RW from bed to w/c or BSC. Pt does not mobilize herself once in the w/c.  ADL's / Homemaking Assistance Needed: Assist for  bathing, dressing tasks. Pt is able to self feed and performing seated grooming tasks at baseline. All IADLs performed by facility.  Comments: Reports no falls in the past 6 months.     Hand Dominance        Extremity/Trunk Assessment   Upper Extremity Assessment Upper Extremity Assessment: Generalized weakness    Lower Extremity Assessment Lower Extremity Assessment: Generalized weakness (bilateral knee buckling, limited knee ROM (baseline))       Communication   Communication: No difficulties  Cognition Arousal/Alertness: Awake/alert Behavior During Therapy: WFL for tasks assessed/performed Overall Cognitive Status: History of cognitive impairments - at baseline                                 General Comments: Pt oriented to self and "hospital". She is oriented to situation. Able to follow all basic one step commands. Generally pleasant.      General Comments      Exercises Other Exercises Other Exercises: Educated on PT role, POC, safety with mobility, d/c recs. Daughter arrived at end of session - PT filled in daughter regarding mobility status, d/c recs, pain and communication with RN for meds.   Assessment/Plan    PT Assessment Patient needs continued PT services  PT Problem List Decreased strength;Decreased range of motion;Decreased activity tolerance;Decreased safety awareness;Decreased balance;Decreased mobility       PT Treatment Interventions DME instruction;Balance training;Neuromuscular re-education;Functional mobility training;Patient/family education;Therapeutic activities;Therapeutic exercise;Wheelchair mobility training    PT Goals (Current goals can be found in the Care Plan section)  Acute Rehab PT Goals Patient Stated Goal: to get stronger and able to stand again PT Goal Formulation: With patient/family Time For Goal Achievement: 11/21/20 Potential to Achieve Goals: Fair    Frequency Min 2X/week   Barriers to discharge         Co-evaluation               AM-PAC PT "6 Clicks" Mobility  Outcome Measure Help needed turning from your back to your side while in a flat bed without using bedrails?: A Lot Help needed moving from lying on your back to sitting on the side of a flat bed without using bedrails?: A Lot Help needed moving to and from a bed to a chair (including a wheelchair)?: Total Help needed standing up from a chair using your arms (e.g., wheelchair or bedside chair)?: Total Help needed to walk in hospital room?: Total Help needed climbing 3-5 steps with a railing? : Total 6 Click Score: 8    End of Session Equipment Utilized During Treatment: Gait belt Activity Tolerance: Patient limited by fatigue;Patient limited by pain (weakness, unable to stand with assist of 1 due to safety concerns) Patient left: in bed;with call bell/phone within reach;with bed alarm set;with family/visitor present Nurse Communication: Mobility status;Patient requests pain meds PT Visit Diagnosis: Unsteadiness on feet (R26.81);Muscle weakness (generalized) (M62.81)    Time: 3976-7341 PT Time Calculation (min) (  ACUTE ONLY): 46 min   Charges:   PT Evaluation $PT Eval Moderate Complexity: 1 Mod PT Treatments $Therapeutic Activity: 38-52 mins        Basilia Jumbo PT, DPT 11/07/20 1:29 PM [phone]

## 2020-11-07 NOTE — Progress Notes (Addendum)
PROGRESS NOTE    Katrina Chen  VHQ:[id number] DOB: [birth year] DOA: 11/05/2020 PCP: Shawn Stall, MD   Brief Narrative:  85 y.o. female with medical history significant for history of bipolar, history of hypertension, chronic atrial fibrillation, CHA2DS2-VASc score of 4, on chronic anticoagulation, status post pacemaker, diverticulosis, CAD, presents to the emergency department for chief concerns of hurting all over including chest pain.   At bedside patient is awake and alert and oriented.  Weakly follows commands.  However she does not want to participate fully in physical exam.  She moans and groans.   Daughter reports that patient had a bowel movement 2 days ago. She lives at facility, [facility] and daughter does not know the consistency of her bowel movement last.  Imaging survey significant for moderate stool burden with 6 cm fecal ball.  No evidence of obstruction.  Patient was in a significant amount of pain.  P.o. and PR bowel regimen ineffective.  Smog enema administered 8/27.  Good effect with large BM.  Follow-up KUB on 8/28 shows persistent stool burden.  Repeat smog enema ordered.   Assessment & Plan:   Principal Problem:   Obstipation Active Problems:   Chest pain   CAD (coronary artery disease)   Hyperkalemia   History of bipolar disorder   Atrial fibrillation, chronic (HCC)   CKD (chronic kidney disease), stage III (HCC)   Frail elderly   Weakness   Protein-calorie malnutrition (HCC)  Diffuse pain Generalized weakness Suspected obstipation CT imaging survey with 6 cm stool ball left colon No evidence of obstruction Smog enema effective on 8/27 Pain and weakness much improved Need skilled nursing facility at time of discharge, will return to [facility] Plan: Continue bowel regimen Repeat smog enema Therapy and ambulation as tolerated Avoid narcotics  Chest pain Resolved Low suspicion for ACS Troponins flat  Hyperkalemia Improved after 1 dose  Kayexalate  CKD stage IIIb Creatinine baseline  History of hypertension PTA meds have been restarted  History of atrial fibrillation on Eliquis Rate controlled Continue anticoagulation  Anxiety/depression History of bipolar disorder PTA duloxetine PTA Xanax PTA Zyprexa  Diverticulosis No radiographic or clinical evidence of acute diverticulitis Outpatient follow-up    DVT prophylaxis: Eliquis Code Status: DNR Family Communication: Daughter at bedside 8/28 Disposition Plan: Status is: Observation  The patient will require care spanning > 2 midnights and should be moved to inpatient because: Ongoing active pain requiring inpatient pain management and Inpatient level of care appropriate due to severity of illness  Dispo: The patient is from: SNF              Anticipated d/c is to: SNF              Patient currently is not medically stable to d/c.   Difficult to place patient No  Patient was still with significant stool burden.  Will attempt smog enema today.  If effective and patient able to have another BM will be medically stable for discharge as of 8/29     Level of care: Med-Surg  Consultants:  None  Procedures:  None  Antimicrobials:  None   Subjective: Patient seen and examined.  Much more awake this morning.  In less distress  Objective: Vitals:   11/07/20 0002 11/07/20 0437 11/07/20 0809 11/07/20 1118  BP: (!) 157/66 (!) 159/57 (!) 161/67 (!) 185/76  Pulse: 70 62 62 (!) 58  Resp: Temp: 97.9 F (36.6 C) 98 F (36.7 C) 97.7  F (36.5 C) 98 F (36.7 C)  TempSrc:    Oral  SpO2: 99% 100% 100%   Weight:      Height:        Intake/Output Summary (Last 24 hours) at 11/07/2020 1534 Last data filed at 11/07/2020 1119 Gross per 24 hour  Intake 240 ml  Output 625 ml  Net -385 ml   Filed Weights   11/05/20 1333  Weight: 76.7 kg    Examination:  General exam: Resting comfortably in bed.  Appears frail Respiratory system: Clear  to auscultation. Respiratory effort normal. Cardiovascular system: S1 & S2 heard, RRR. No JVD, murmurs, rubs, gallops or clicks. No pedal edema. Gastrointestinal system: Soft, nontender, nondistended, normal bowel sounds Central nervous system: Alert, oriented x2, no focal deficits Extremities: Symmetric 5 x 5 power. Skin: No rashes, lesions or ulcers Psychiatry: Judgement and insight appear normal. Mood & affect appropriate.     Data Reviewed: I have personally reviewed following labs and imaging studies  CBC: Recent Labs  Lab 11/05/20 1340  WBC 4.8  HGB 12.7  HCT 40.0  MCV 87.1  PLT 267   Basic Metabolic Panel: Recent Labs  Lab 11/05/20 1340 11/06/20 0737  NA 131*  --   K 5.2*  --   CL 98  --   CO2 28  --   GLUCOSE 78  --   BUN 23  --   CREATININE 1.40*  --   CALCIUM 9.4  --   MG  --  2.6*   GFR: Estimated Creatinine Clearance: 32.5 mL/min (A) (by C-G formula based on SCr of 1.4 mg/dL (H)). Liver Function Tests: No results for input(s): AST, ALT, ALKPHOS, BILITOT, PROT, ALBUMIN in the last 168 hours. No results for input(s): LIPASE, AMYLASE in the last 168 hours. No results for input(s): AMMONIA in the last 168 hours. Coagulation Profile: No results for input(s): INR, PROTIME in the last 168 hours. Cardiac Enzymes: No results for input(s): CKTOTAL, CKMB, CKMBINDEX, TROPONINI in the last 168 hours. BNP (last 3 results) No results for input(s): PROBNP in the last 8760 hours. HbA1C: No results for input(s): HGBA1C in the last 72 hours. CBG: No results for input(s): GLUCAP in the last 168 hours. Lipid Profile: Recent Labs    11/06/20 0737  CHOL 172  HDL 48  LDLCALC 104*  TRIG 101  CHOLHDL 3.6   Thyroid Function Tests: Recent Labs    11/05/20 1535  TSH 0.548   Anemia Panel: Recent Labs    11/06/20 0737  VITAMINB12 198   Sepsis Labs: No results for input(s): PROCALCITON, LATICACIDVEN in the last 168 hours.  Recent Results (from the past 240  hour(s))  Resp Panel by RT-PCR (Flu A&B, Covid) Nasopharyngeal Swab     Status: None   Collection Time: 11/05/20  3:35 PM   Specimen: Nasopharyngeal Swab; Nasopharyngeal(NP) swabs in vial transport medium  Result Value Ref Range Status   SARS Coronavirus 2 by RT PCR NEGATIVE NEGATIVE Final    Comment: (NOTE) SARS-CoV-2 target nucleic acids are NOT DETECTED.  The SARS-CoV-2 RNA is generally detectable in upper respiratory specimens during the acute phase of infection. The lowest concentration of SARS-CoV-2 viral copies this assay can detect is 138 copies/mL. A negative result does not preclude SARS-Cov-2 infection and should not be used as the sole basis for treatment or other patient management decisions. A negative result may occur with  improper specimen collection/handling, submission of specimen other than nasopharyngeal swab, presence of viral mutation(s) within  the areas targeted by this assay, and inadequate number of viral copies(<138 copies/mL). A negative result must be combined with clinical observations, patient history, and epidemiological information. The expected result is Negative.  Fact Sheet for Patients:  BloggerCourse.com  Fact Sheet for Healthcare Providers:  SeriousBroker.it  This test is no t yet approved or cleared by the [place] FDA and  has been authorized for detection and/or diagnosis of SARS-CoV-2 by FDA under an Emergency Use Authorization (EUA). This EUA will remain  in effect (meaning this test can be used) for the duration of the COVID-19 declaration under Section 564(b)(1) of the Act, 21 U.S.C.section 360bbb-3(b)(1), unless the authorization is terminated  or revoked sooner.       Influenza A by PCR NEGATIVE NEGATIVE Final   Influenza B by PCR NEGATIVE NEGATIVE Final    Comment: (NOTE) The Xpert Xpress SARS-CoV-2/FLU/RSV plus assay is intended as an aid in the diagnosis of influenza from  Nasopharyngeal swab specimens and should not be used as a sole basis for treatment. Nasal washings and aspirates are unacceptable for Xpert Xpress SARS-CoV-2/FLU/RSV testing.  Fact Sheet for Patients: BloggerCourse.com  Fact Sheet for Healthcare Providers: SeriousBroker.it  This test is not yet approved or cleared by the [place] FDA and has been authorized for detection and/or diagnosis of SARS-CoV-2 by FDA under an Emergency Use Authorization (EUA). This EUA will remain in effect (meaning this test can be used) for the duration of the COVID-19 declaration under Section 564(b)(1) of the Act, 21 U.S.C. section 360bbb-3(b)(1), unless the authorization is terminated or revoked.  Performed at [facility], [address] [place], [place] [address]          Radiology Studies: CT ABDOMEN PELVIS WO CONTRAST  Result Date: 11/05/2020 CLINICAL DATA:  Flank pain, kidney stone suspected Low back pain, increased fracture risk Back and flank pain, unknown cause. Denies falling, pt states "it feels like my back is breaking!" EXAM: CT ABDOMEN AND PELVIS WITHOUT CONTRAST CT LUMBAR SPINE WITHOUT CONTRAST TECHNIQUE: Multidetector CT imaging of the abdomen and pelvis was performed following the standard protocol without IV contrast. Multidetector CT imaging of the lumbar spine was performed without intravenous contrast administration. Multiplanar CT image reconstructions were also generated. COMPARISON:  None. FINDINGS: CT ABD/PELVIS Lower chest: Linear atelectasis versus scarring of the right base. Partially visualized cardiac lead. Otherwise no acute abnormality. Hepatobiliary: No focal liver abnormality. No gallstones, gallbladder wall thickening, or pericholecystic fluid. No biliary dilatation. Pancreas: No focal lesion. Normal pancreatic contour. No surrounding inflammatory changes. No main pancreatic ductal dilatation. Spleen: Normal  in size without focal abnormality. Adrenals/Urinary Tract: No adrenal nodule bilaterally. No nephrolithiasis and no hydronephrosis. No definite contour-deforming renal mass. No ureterolithiasis or hydroureter. The urinary bladder is distended with urine and grossly unremarkable. Stomach/Bowel: Stomach is within normal limits. No evidence of bowel wall thickening or dilatation. Stool throughout the left colon. Diffuse sigmoid diverticulosis. No pneumatosis. Appendix appears normal. Vascular/Lymphatic: No abdominal aorta or iliac aneurysm. Severe atherosclerotic plaque of the aorta and its branches. No abdominal, pelvic, or inguinal lymphadenopathy. Reproductive: Status post hysterectomy. No adnexal masses. Other: No intraperitoneal free fluid. No intraperitoneal free gas. No organized fluid collection. Musculoskeletal: No abdominal wall hernia or abnormality. No suspicious lytic or blastic osseous lesions. No acute displaced fracture. CT LUMBAR Segmentation: 5 non rib-bearing lumbar report T Wo bodies are identified Alignment: Straightening of the normal lumbar lordosis likely due to positioning and surgical hardware. Vertebrae: Status post L3 through S1 posterolateral and  interbody fusion. Status post laminectomy at these levels. Multilevel degenerative changes of the spine. No acute fracture or focal pathologic process. No suspicious lytic or blastic osseous lesions. There is a densely sclerotic lesion within the right iliac bone that may represent a bone island. Well-defined lytic lesion with a narrow zone of transmission within the right iliac bone along the sacroiliac joint is likely of benign etiology. Paraspinal and other soft tissues: Negative. IMPRESSION: 1. No acute intra-abdominal or intrapelvic abnormality with limited evaluation on this noncontrast study. 2. Diffuse sigmoid diverticulosis with no acute diverticulitis. 3. Stool throughout the left colon. 4. No acute displaced fracture or traumatic  listhesis of the lumbar spine in a patient status post L3 through S1 posterolateral and interbody fusion. 5. A densely sclerotic lesion within the right iliac bone may represent a bone island. Comparison with prior imaging would be of value. 6.  Aortic Atherosclerosis (ICD10-I70.0)  - severe. Electronically Signed   By: Tish Frederickson M.D.   On: 11/05/2020 18:19   DG Abd 1 View  Result Date: 11/07/2020 CLINICAL DATA:  Constipation.  Status post enema EXAM: ABDOMEN - 1 VIEW COMPARISON:  11/07/2020 FINDINGS: Similar appearance of moderate stool burden within the colon. No dilated bowel loops identified. Postsurgical changes within the lumbar spine again noted. IMPRESSION: Similar moderate stool burden compared to 11/05/2020. Electronically Signed   By: Signa Kell M.D.   On: 11/07/2020 07:59   CT HEAD WO CONTRAST ( )  Result Date: 11/05/2020 CLINICAL DATA:  Mental status change. Unknown cause. Pain all over. Chest tightness. EXAM: CT HEAD WITHOUT CONTRAST TECHNIQUE: Contiguous axial images were obtained from the base of the skull through the vertex without intravenous contrast. COMPARISON:  None. BRAIN: Atherosclerotic calcifications are present within the cavernous internal carotid arteries. No evidence of large-territorial acute infarction. No parenchymal hemorrhage. No mass lesion. No extra-axial collection. No mass effect or midline shift. No hydrocephalus. Basilar cisterns are patent. Vascular: No hyperdense vessel. Skull: No acute fracture or focal lesion. Sinuses/Orbits: Paranasal sinuses and mastoid air cells are clear. Bilateral lens replacement. Otherwise orbits are unremarkable. Other: None. IMPRESSION: No acute intracranial abnormality. Electronically Signed   By: Tish Frederickson M.D.   On: 11/05/2020 18:07   CT L-SPINE NO CHARGE  Result Date: 11/05/2020 CLINICAL DATA:  Flank pain, kidney stone suspected Low back pain, increased fracture risk Back and flank pain, unknown cause. Denies  falling, pt states "it feels like my back is breaking!" EXAM: CT ABDOMEN AND PELVIS WITHOUT CONTRAST CT LUMBAR SPINE WITHOUT CONTRAST TECHNIQUE: Multidetector CT imaging of the abdomen and pelvis was performed following the standard protocol without IV contrast. Multidetector CT imaging of the lumbar spine was performed without intravenous contrast administration. Multiplanar CT image reconstructions were also generated. COMPARISON:  None. FINDINGS: CT ABD/PELVIS Lower chest: Linear atelectasis versus scarring of the right base. Partially visualized cardiac lead. Otherwise no acute abnormality. Hepatobiliary: No focal liver abnormality. No gallstones, gallbladder wall thickening, or pericholecystic fluid. No biliary dilatation. Pancreas: No focal lesion. Normal pancreatic contour. No surrounding inflammatory changes. No main pancreatic ductal dilatation. Spleen: Normal in size without focal abnormality. Adrenals/Urinary Tract: No adrenal nodule bilaterally. No nephrolithiasis and no hydronephrosis. No definite contour-deforming renal mass. No ureterolithiasis or hydroureter. The urinary bladder is distended with urine and grossly unremarkable. Stomach/Bowel: Stomach is within normal limits. No evidence of bowel wall thickening or dilatation. Stool throughout the left colon. Diffuse sigmoid diverticulosis. No pneumatosis. Appendix appears normal. Vascular/Lymphatic: No abdominal aorta or iliac aneurysm. Severe  atherosclerotic plaque of the aorta and its branches. No abdominal, pelvic, or inguinal lymphadenopathy. Reproductive: Status post hysterectomy. No adnexal masses. Other: No intraperitoneal free fluid. No intraperitoneal free gas. No organized fluid collection. Musculoskeletal: No abdominal wall hernia or abnormality. No suspicious lytic or blastic osseous lesions. No acute displaced fracture. CT LUMBAR Segmentation: 5 non rib-bearing lumbar report T Wo bodies are identified Alignment: Straightening of the  normal lumbar lordosis likely due to positioning and surgical hardware. Vertebrae: Status post L3 through S1 posterolateral and interbody fusion. Status post laminectomy at these levels. Multilevel degenerative changes of the spine. No acute fracture or focal pathologic process. No suspicious lytic or blastic osseous lesions. There is a densely sclerotic lesion within the right iliac bone that may represent a bone island. Well-defined lytic lesion with a narrow zone of transmission within the right iliac bone along the sacroiliac joint is likely of benign etiology. Paraspinal and other soft tissues: Negative. IMPRESSION: 1. No acute intra-abdominal or intrapelvic abnormality with limited evaluation on this noncontrast study. 2. Diffuse sigmoid diverticulosis with no acute diverticulitis. 3. Stool throughout the left colon. 4. No acute displaced fracture or traumatic listhesis of the lumbar spine in a patient status post L3 through S1 posterolateral and interbody fusion. 5. A densely sclerotic lesion within the right iliac bone may represent a bone island. Comparison with prior imaging would be of value. 6.  Aortic Atherosclerosis (ICD10-I70.0)  - severe. Electronically Signed   By: Tish Frederickson M.D.   On: 11/05/2020 18:19   CT Angio Chest/Abd/Pel for Dissection W and/or Wo Contrast  Result Date: 11/05/2020 CLINICAL DATA:  Abdominal pain, aortic dissection suspected EXAM: CT ANGIOGRAPHY CHEST, ABDOMEN AND PELVIS TECHNIQUE: Non-contrast CT of the chest was initially obtained. Multidetector CT imaging through the chest, abdomen and pelvis was performed using the standard protocol during bolus administration of intravenous contrast. Multiplanar reconstructed images and MIPs were obtained and reviewed to evaluate the vascular anatomy. CONTRAST:  77mL OMNIPAQUE IOHEXOL 350 MG/ML SOLN COMPARISON:  None. FINDINGS: CTA CHEST FINDINGS Cardiovascular: Preferential opacification of the thoracic aorta. No evidence of  thoracic aortic aneurysm or dissection. Moderate atherosclerotic plaque of the thoracic aorta. Four-vessel coronary artery calcifications. Normal heart size. No significant pericardial effusion. The main pulmonary artery is normal in caliber. No central pulmonary embolus. Mediastinum/Nodes: No enlarged mediastinal, hilar, or axillary lymph nodes. Multiple hypodensities within thyroid glands. There is a 1.9 cm right thyroid gland hypodense nodule. The trachea and esophagus demonstrate no significant findings. Lungs/Pleura: No focal consolidation. No pulmonary nodule. No pulmonary mass. No pleural effusion. No pneumothorax. Musculoskeletal: No chest wall abnormality. No suspicious lytic or blastic osseous lesions. No acute displaced fracture. Multilevel mild degenerative changes of the spine. Review of the MIP images confirms the above findings. CTA ABDOMEN AND PELVIS FINDINGS VASCULAR Aorta: Severe calcified and noncalcified atherosclerotic plaque. Normal caliber aorta without aneurysm, dissection, vasculitis. Celiac: At least mild atherosclerotic plaque. At least mild stenosis of the origin of the celiac artery. Patent without evidence of aneurysm, dissection, vasculitis or significant stenosis. SMA: At least mild atherosclerotic plaque. Patent without evidence of aneurysm, dissection, vasculitis or significant stenosis. Renals: Atherosclerotic plaque moderate severe stenosis at the origin of bilateral renal arteries. Opacification of the vessels distally is noted. Both renal arteries are patent without evidence of aneurysm, dissection, vasculitis, fibromuscular dysplasia or significant stenosis. IMA: Atherosclerotic plaque. 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 Opacification of the inferior mesenteric artery is noted distally. Otherwise  patent without evidence of aneurysm, dissection, vasculitis or significant stenosis. Inflow: Patent without evidence of  aneurysm, dissection, vasculitis or significant stenosis. Veins: No obvious venous abnormality within the limitations of this arterial phase study. Review of the MIP images confirms the above findings. NON-VASCULAR Hepatobiliary: No focal liver abnormality. No gallstones, gallbladder wall thickening, or pericholecystic fluid. No biliary dilatation. Pancreas: No focal lesion. Normal pancreatic contour. No surrounding inflammatory changes. No main pancreatic ductal dilatation. Spleen: Normal in size without focal abnormality. Adrenals/Urinary Tract: No adrenal nodule bilaterally. Bilateral kidneys enhance symmetrically. 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 No hydronephrosis. No hydroureter. The urinary bladder is distended with urine. Stomach/Bowel: Stomach is within normal limits. No evidence of bowel wall thickening or dilatation. Mid rectal stool ball of 6 cm. Stool noted throughout the left colon. Diffuse sigmoid diverticulosis. No pneumatosis. Appendix appears normal. Lymphatic: No lymphadenopathy. Reproductive: Status post hysterectomy. No adnexal masses. Other: No intraperitoneal free fluid. No intraperitoneal free gas. No organized fluid collection. Musculoskeletal: No abdominal wall hernia or abnormality. No suspicious lytic or blastic osseous lesions. No acute displaced fracture. Status post L3 through S1 posterolateral and interbody usion. Status post laminectomy at these levels. Multilevel dgenerative changes of the spine. Review of the MIP images confirms the above findings. IMPRESSION: 1. No acute aortic abnormality. 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 2. No central pulmonary embolus. 3. No acute intrathoracic, intra-abdominal, intrapelvic abnormality. 4. Stool throughout the left colon with  a 6 cm stool ball within the mid rectum. Diffuse sigmoid diverticulosis with no acute diverticulitis. 5. A 1.9 cm right thyroid gland nodule Recommend thyroid US (ref: J Am Coll Radiol. 2015 Feb;12(2): 143-50). In the setting of significant comorbidities or limited life expectancy, no follow-up recommended (ref: J Am Coll Radiol. 2015 Feb;12(2): 143-50). Electronically Signed   By: Tish FredericksonMorgane  Naveau M.D.   On: 11/05/2020 20:20        Scheduled Meds:  apixaban  2.5 mg Oral BID   carvedilol  12.5 mg Oral BID   celecoxib  100 mg Oral BID   cloNIDine  0.1 mg Oral BID   DULoxetine  60 mg Oral BID   feeding supplement  1 Container Oral TID BM   gabapentin  100 mg Oral Daily   loratadine  10 mg Oral Daily   LORazepam  1 mg Intravenous Once   losartan  100 mg Oral Daily   melatonin  10 mg Oral QHS   OLANZapine  2.5 mg Oral QHS   olopatadine  1 drop Both Eyes BID   polyethylene glycol  17 g Oral Daily   senna-docusate  1 tablet Oral BID   sorbitol, milk of mag, mineral oil, glycerin (SMOG) enema  960 mL Rectal Once   Continuous Infusions:   LOS: 0 days    Time spent: 25 minutes    Tresa MooreSudheer B Daily Doe, MD [facility] Pager [phone]  If 7PM-7AM, please contact night-coverage 11/07/2020, 3:34 PM

## 2020-11-08 MED ORDER — SENNOSIDES-DOCUSATE SODIUM 8.6-50 MG PO TABS: 1.0000 | ORAL_TABLET | Freq: Two times a day (BID) | ORAL | Status: AC

## 2020-11-08 MED ORDER — FLEET ENEMA 7-19 GM/118ML RE ENEM: 1.0000 | ENEMA | Freq: Every day | RECTAL | 0 refills | Status: DC | PRN

## 2020-11-08 MED ORDER — POLYETHYLENE GLYCOL 3350 17 G PO PACK: 17.0000 g | PACK | Freq: Every day | ORAL | 0 refills | Status: DC

## 2020-11-08 MED ORDER — BISACODYL 10 MG RE SUPP: 10.0000 mg | Freq: Every day | RECTAL | 0 refills | Status: AC | PRN

## 2020-11-08 NOTE — Discharge Summary (Signed)
Physician Discharge Summary  Katrina Chen ZOX:[id number] DOB: [birth year] DOA: 11/05/2020  PCP: Shawn Stall, MD  Admit date: 11/05/2020 Discharge date: 11/08/2020  Admitted From: SNF Disposition: SNF  Recommendations for Outpatient Follow-up:  Follow up with PCP in 1-2 weeks   Home Health: No Equipment/Devices: None  Discharge Condition: Stable CODE STATUS: DNR Diet recommendation: Soft  Brief/Interim Summary: 85 y.o. female with medical history significant for history of bipolar, history of hypertension, chronic atrial fibrillation, CHA2DS2-VASc score of 4, on chronic anticoagulation, status post pacemaker, diverticulosis, CAD, presents to the emergency department for chief concerns of hurting all over including chest pain.   At bedside patient is awake and alert and oriented.  Weakly follows commands.  However she does not want to participate fully in physical exam.  She moans and groans.   Daughter reports that patient had a bowel movement 2 days ago. She lives at facility, [facility] and daughter does not know the consistency of her bowel movement last.   Imaging survey significant for moderate stool burden with 6 cm fecal ball.  No evidence of obstruction.  Patient was in a significant amount of pain.  P.o. and PR bowel regimen ineffective.  Smog enema administered 8/27.  Good effect with large BM.  Follow-up KUB on 8/28 shows persistent stool burden.  Repeat smog enema ordered.  Repeat smog enema produced only small amount of stool.  On day of discharge belly is soft.  Patient is not in pain.  I had a lengthy discussion with the patient and her daughter at bedside.  Explained that chronic narcotics is likely exacerbating her underlying chronic constipation.  I explained the patient needs to be on aggressive daily scheduled bowel regimen in order to keep bowel habits regular.  I offered additional smog enema which the patient declined at this time.  Will discharge back to [facility].  At time of discharge will augment bowel regimen to include Senokot Colace 1 tab twice daily, MiraLAX 17 g p.o. daily, Dulcolax suppository daily as needed, Fleet enema daily as needed.  Discharge Diagnoses:  Principal Problem:   Obstipation Active Problems:   Chest pain   CAD (coronary artery disease)   Hyperkalemia   History of bipolar disorder   Atrial fibrillation, chronic (HCC)   CKD (chronic kidney disease), stage III (HCC)   Frail elderly   Weakness   Protein-calorie malnutrition (HCC)  Diffuse pain Generalized weakness Suspected obstipation CT imaging survey with 6 cm stool ball left colon No evidence of obstruction Smog enema effective on 8/27 Pain and weakness much improved Need skilled nursing facility at time of discharge, will return to [facility] At time of discharge change bowel regimen to Senokot/Colace 1 tab twice daily, MiraLAX 17 g p.o. daily, Dulcolax suppository 1 tab daily as needed, Fleet enema once daily as needed Lengthy discussion with patient and daughter.  Avoid narcotics if able   Chest pain Resolved Low suspicion for ACS Troponins flat   Hyperkalemia Improved after 1 dose Kayexalate   CKD stage IIIb Creatinine baseline   History of hypertension PTA meds have been restarted   History of atrial fibrillation on Eliquis Rate controlled Continue anticoagulation   Anxiety/depression History of bipolar disorder PTA duloxetine PTA Xanax PTA Zyprexa   Diverticulosis No radiographic or clinical evidence of acute diverticulitis Outpatient follow-up  Discharge Instructions  Discharge Instructions     Diet - low sodium heart healthy   Complete by: As directed    Increase activity slowly  Complete by: As directed       Allergies as of 11/08/2020       Reactions   Iodine Swelling   Amlodipine Other (See Comments)   Nifedipine Swelling, Other (See Comments)   Lisinopril Swelling   Quinapril Swelling   Shellfish  Allergy    Simvastatin Swelling        Medication List     STOP taking these medications    docusate sodium 100 MG capsule Commonly known as: COLACE   spironolactone 25 MG tablet Commonly known as: ALDACTONE       TAKE these medications    bisacodyl 10 MG suppository Commonly known as: DULCOLAX Place 1 suppository (10 mg total) rectally daily as needed for mild constipation.   carvedilol 6.25 MG tablet Commonly known as: COREG Take 12.5 mg by mouth 2 (two) times daily.   celecoxib 100 MG capsule Commonly known as: CELEBREX Take 100 mg by mouth 2 (two) times daily.   cetirizine 5 MG tablet Commonly known as: ZYRTEC Take 5 mg by mouth daily.   cloNIDine 0.1 MG tablet Commonly known as: CATAPRES Take 0.1 mg by mouth 2 (two) times daily.   DULoxetine 60 MG capsule Commonly known as: CYMBALTA Take 60 mg by mouth 2 (two) times daily.   Eliquis 2.5 MG Tabs tablet Generic drug: apixaban Take 2.5 mg by mouth 2 (two) times daily.   gabapentin 100 MG capsule Commonly known as: NEURONTIN Take 100 mg by mouth daily.   losartan 100 MG tablet Commonly known as: COZAAR Take 100 mg by mouth daily.   melatonin 5 MG Tabs Take 10 mg by mouth at bedtime.   OLANZapine 2.5 MG tablet Commonly known as: ZYPREXA Take 2.5 mg by mouth at bedtime.   olopatadine 0.1 % ophthalmic solution Commonly known as: PATANOL Place 1 drop into both eyes 2 (two) times daily.   polyethylene glycol 17 g packet Commonly known as: MIRALAX / GLYCOLAX Take 17 g by mouth daily. Start taking on: November 09, 2020   senna-docusate 8.6-50 MG tablet Commonly known as: Senokot-S Take 1 tablet by mouth 2 (two) times daily.   sodium phosphate 7-19 GM/118ML Enem Place 133 mLs (1 enema total) rectally daily as needed for severe constipation.   traMADol 50 MG tablet Commonly known as: ULTRAM Take 50 mg by mouth every 6 (six) hours as needed for pain.        Allergies  Allergen Reactions    Iodine Swelling   Amlodipine Other (See Comments)   Nifedipine Swelling and Other (See Comments)   Lisinopril Swelling   Quinapril Swelling   Shellfish Allergy    Simvastatin Swelling    Consultations: None   Procedures/Studies: CT ABDOMEN PELVIS WO CONTRAST  Result Date: 11/05/2020 CLINICAL DATA:  Flank pain, kidney stone suspected Low back pain, increased fracture risk Back and flank pain, unknown cause. Denies falling, pt states "it feels like my back is breaking!" EXAM: CT ABDOMEN AND PELVIS WITHOUT CONTRAST CT LUMBAR SPINE WITHOUT CONTRAST TECHNIQUE: Multidetector CT imaging of the abdomen and pelvis was performed following the standard protocol without IV contrast. Multidetector CT imaging of the lumbar spine was performed without intravenous contrast administration. Multiplanar CT image reconstructions were also generated. COMPARISON:  None. FINDINGS: CT ABD/PELVIS Lower chest: Linear atelectasis versus scarring of the right base. Partially visualized cardiac lead. Otherwise no acute abnormality. Hepatobiliary: No focal liver abnormality. No gallstones, gallbladder wall thickening, or pericholecystic fluid. No biliary dilatation. Pancreas: No focal lesion. Normal pancreatic  contour. No surrounding inflammatory changes. No main pancreatic ductal dilatation. Spleen: Normal in size without focal abnormality. Adrenals/Urinary Tract: No adrenal nodule bilaterally. No nephrolithiasis and no hydronephrosis. No definite contour-deforming renal mass. No ureterolithiasis or hydroureter. The urinary bladder is distended with urine and grossly unremarkable. Stomach/Bowel: Stomach is within normal limits. No evidence of bowel wall thickening or dilatation. Stool throughout the left colon. Diffuse sigmoid diverticulosis. No pneumatosis. Appendix appears normal. Vascular/Lymphatic: No abdominal aorta or iliac aneurysm. Severe atherosclerotic plaque of the aorta and its branches. No abdominal, pelvic, or  inguinal lymphadenopathy. Reproductive: Status post hysterectomy. No adnexal masses. Other: No intraperitoneal free fluid. No intraperitoneal free gas. No organized fluid collection. Musculoskeletal: No abdominal wall hernia or abnormality. No suspicious lytic or blastic osseous lesions. No acute displaced fracture. CT LUMBAR Segmentation: 5 non rib-bearing lumbar report T Wo bodies are identified Alignment: Straightening of the normal lumbar lordosis likely due to positioning and surgical hardware. Vertebrae: Status post L3 through S1 posterolateral and interbody fusion. Status post laminectomy at these levels. Multilevel degenerative changes of the spine. No acute fracture or focal pathologic process. No suspicious lytic or blastic osseous lesions. There is a densely sclerotic lesion within the right iliac bone that may represent a bone island. Well-defined lytic lesion with a narrow zone of transmission within the right iliac bone along the sacroiliac joint is likely of benign etiology. Paraspinal and other soft tissues: Negative. IMPRESSION: 1. No acute intra-abdominal or intrapelvic abnormality with limited evaluation on this noncontrast study. 2. Diffuse sigmoid diverticulosis with no acute diverticulitis. 3. Stool throughout the left colon. 4. No acute displaced fracture or traumatic listhesis of the lumbar spine in a patient status post L3 through S1 posterolateral and interbody fusion. 5. A densely sclerotic lesion within the right iliac bone may represent a bone island. Comparison with prior imaging would be of value. 6.  Aortic Atherosclerosis (ICD10-I70.0)  - severe. Electronically Signed   By: Tish FredericksonMorgane  Naveau M.D.   On: 11/05/2020 18:19   DG Chest 2 View  Result Date: 11/05/2020 CLINICAL DATA:  Chest tightness for 3 days EXAM: CHEST - 2 VIEW COMPARISON:  None. FINDINGS: Frontal and lateral views of the chest demonstrate an unremarkable cardiac silhouette. Loop recorder is seen within the left  anterior chest. No airspace disease, effusion, or pneumothorax. No acute bony abnormalities. IMPRESSION: 1. No acute intrathoracic process. Electronically Signed   By: Sharlet SalinaMichael  Brown M.D.   On: 11/05/2020 15:42   DG Abd 1 View  Result Date: 11/07/2020 CLINICAL DATA:  Constipation.  Status post enema EXAM: ABDOMEN - 1 VIEW COMPARISON:  11/07/2020 FINDINGS: Similar appearance of moderate stool burden within the colon. No dilated bowel loops identified. Postsurgical changes within the lumbar spine again noted. IMPRESSION: Similar moderate stool burden compared to 11/05/2020. Electronically Signed   By: Signa Kellaylor  Stroud M.D.   On: 11/07/2020 07:59   CT HEAD WO CONTRAST (5MM)  Result Date: 11/05/2020 CLINICAL DATA:  Mental status change. Unknown cause. Pain all over. Chest tightness. EXAM: CT HEAD WITHOUT CONTRAST TECHNIQUE: Contiguous axial images were obtained from the base of the skull through the vertex without intravenous contrast. COMPARISON:  None. BRAIN: Atherosclerotic calcifications are present within the cavernous internal carotid arteries. No evidence of large-territorial acute infarction. No parenchymal hemorrhage. No mass lesion. No extra-axial collection. No mass effect or midline shift. No hydrocephalus. Basilar cisterns are patent. Vascular: No hyperdense vessel. Skull: No acute fracture or focal lesion. Sinuses/Orbits: Paranasal sinuses and mastoid air cells are clear.  Bilateral lens replacement. Otherwise orbits are unremarkable. Other: None. IMPRESSION: No acute intracranial abnormality. Electronically Signed   By: Tish Frederickson M.D.   On: 11/05/2020 18:07   CT L-SPINE NO CHARGE  Result Date: 11/05/2020 CLINICAL DATA:  Flank pain, kidney stone suspected Low back pain, increased fracture risk Back and flank pain, unknown cause. Denies falling, pt states "it feels like my back is breaking!" EXAM: CT ABDOMEN AND PELVIS WITHOUT CONTRAST CT LUMBAR SPINE WITHOUT CONTRAST TECHNIQUE: Multidetector  CT imaging of the abdomen and pelvis was performed following the standard protocol without IV contrast. Multidetector CT imaging of the lumbar spine was performed without intravenous contrast administration. Multiplanar CT image reconstructions were also generated. COMPARISON:  None. FINDINGS: CT ABD/PELVIS Lower chest: Linear atelectasis versus scarring of the right base. Partially visualized cardiac lead. Otherwise no acute abnormality. Hepatobiliary: No focal liver abnormality. No gallstones, gallbladder wall thickening, or pericholecystic fluid. No biliary dilatation. Pancreas: No focal lesion. Normal pancreatic contour. No surrounding inflammatory changes. No main pancreatic ductal dilatation. Spleen: Normal in size without focal abnormality. Adrenals/Urinary Tract: No adrenal nodule bilaterally. No nephrolithiasis and no hydronephrosis. No definite contour-deforming renal mass. No ureterolithiasis or hydroureter. The urinary bladder is distended with urine and grossly unremarkable. Stomach/Bowel: Stomach is within normal limits. No evidence of bowel wall thickening or dilatation. Stool throughout the left colon. Diffuse sigmoid diverticulosis. No pneumatosis. Appendix appears normal. Vascular/Lymphatic: No abdominal aorta or iliac aneurysm. Severe atherosclerotic plaque of the aorta and its branches. No abdominal, pelvic, or inguinal lymphadenopathy. Reproductive: Status post hysterectomy. No adnexal masses. Other: No intraperitoneal free fluid. No intraperitoneal free gas. No organized fluid collection. Musculoskeletal: No abdominal wall hernia or abnormality. No suspicious lytic or blastic osseous lesions. No acute displaced fracture. CT LUMBAR Segmentation: 5 non rib-bearing lumbar report T Wo bodies are identified Alignment: Straightening of the normal lumbar lordosis likely due to positioning and surgical hardware. Vertebrae: Status post L3 through S1 posterolateral and interbody fusion. Status post  laminectomy at these levels. Multilevel degenerative changes of the spine. No acute fracture or focal pathologic process. No suspicious lytic or blastic osseous lesions. There is a densely sclerotic lesion within the right iliac bone that may represent a bone island. Well-defined lytic lesion with a narrow zone of transmission within the right iliac bone along the sacroiliac joint is likely of benign etiology. Paraspinal and other soft tissues: Negative. IMPRESSION: 1. No acute intra-abdominal or intrapelvic abnormality with limited evaluation on this noncontrast study. 2. Diffuse sigmoid diverticulosis with no acute diverticulitis. 3. Stool throughout the left colon. 4. No acute displaced fracture or traumatic listhesis of the lumbar spine in a patient status post L3 through S1 posterolateral and interbody fusion. 5. A densely sclerotic lesion within the right iliac bone may represent a bone island. Comparison with prior imaging would be of value. 6.  Aortic Atherosclerosis (ICD10-I70.0)  - severe. Electronically Signed   By: Tish Frederickson M.D.   On: 11/05/2020 18:19   CT Angio Chest/Abd/Pel for Dissection W and/or Wo Contrast  Result Date: 11/05/2020 CLINICAL DATA:  Abdominal pain, aortic dissection suspected EXAM: CT ANGIOGRAPHY CHEST, ABDOMEN AND PELVIS TECHNIQUE: Non-contrast CT of the chest was initially obtained. Multidetector CT imaging through the chest, abdomen and pelvis was performed using the standard protocol during bolus administration of intravenous contrast. Multiplanar reconstructed images and MIPs were obtained and reviewed to evaluate the vascular anatomy. CONTRAST:  33mL OMNIPAQUE IOHEXOL 350 MG/ML SOLN COMPARISON:  None. FINDINGS: CTA CHEST FINDINGS Cardiovascular: Preferential opacification  of the thoracic aorta. No evidence of thoracic aortic aneurysm or dissection. Moderate atherosclerotic plaque of the thoracic aorta. Four-vessel coronary artery calcifications. Normal heart size. No  significant pericardial effusion. The main pulmonary artery is normal in caliber. No central pulmonary embolus. Mediastinum/Nodes: No enlarged mediastinal, hilar, or axillary lymph nodes. Multiple hypodensities within thyroid glands. There is a 1.9 cm right thyroid gland hypodense nodule. The trachea and esophagus demonstrate no significant findings. Lungs/Pleura: No focal consolidation. No pulmonary nodule. No pulmonary mass. No pleural effusion. No pneumothorax. Musculoskeletal: No chest wall abnormality. No suspicious lytic or blastic osseous lesions. No acute displaced fracture. Multilevel mild degenerative changes of the spine. Review of the MIP images confirms the above findings. CTA ABDOMEN AND PELVIS FINDINGS VASCULAR Aorta: Severe calcified and noncalcified atherosclerotic plaque. Normal caliber aorta without aneurysm, dissection, vasculitis. Celiac: At least mild atherosclerotic plaque. At least mild stenosis of the origin of the celiac artery. Patent without evidence of aneurysm, dissection, vasculitis or significant stenosis. SMA: At least mild atherosclerotic plaque. Patent without evidence of aneurysm, dissection, vasculitis or significant stenosis. Renals: Atherosclerotic plaque moderate severe stenosis at the origin of bilateral renal arteries. Opacification of the vessels distally is noted. Both renal arteries are patent without evidence of aneurysm, dissection, vasculitis, fibromuscular dysplasia or significant stenosis. IMA: Atherosclerotic plaque. 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 Opacification of the inferior mesenteric artery is noted distally. Otherwise patent without evidence of aneurysm, dissection, vasculitis or significant stenosis. Inflow: Patent without evidence of aneurysm, dissection, vasculitis or significant stenosis. Veins: No obvious venous abnormality within the limitations of this arterial phase study. Review of the MIP  images confirms the above findings. NON-VASCULAR Hepatobiliary: No focal liver abnormality. No gallstones, gallbladder wall thickening, or pericholecystic fluid. No biliary dilatation. Pancreas: No focal lesion. Normal pancreatic contour. No surrounding inflammatory changes. No main pancreatic ductal dilatation. Spleen: Normal in size without focal abnormality. Adrenals/Urinary Tract: No adrenal nodule bilaterally. Bilateral kidneys enhance symmetrically. 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 No hydronephrosis. No hydroureter. The urinary bladder is distended with urine. Stomach/Bowel: Stomach is within normal limits. No evidence of bowel wall thickening or dilatation. Mid rectal stool ball of 6 cm. Stool noted throughout the left colon. Diffuse sigmoid diverticulosis. No pneumatosis. Appendix appears normal. Lymphatic: No lymphadenopathy. Reproductive: Status post hysterectomy. No adnexal masses. Other: No intraperitoneal free fluid. No intraperitoneal free gas. No organized fluid collection. Musculoskeletal: No abdominal wall hernia or abnormality. No suspicious lytic or blastic osseous lesions. No acute displaced fracture. Status post L3 through S1 posterolateral and interbody usion. Status post laminectomy at these levels. Multilevel dgenerative changes of the spine. Review of the MIP images confirms the above findings. IMPRESSION: 1. No acute aortic abnormality. 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 2. No central pulmonary embolus. 3. No acute intrathoracic, intra-abdominal, intrapelvic abnormality. 4. Stool throughout the left colon with a 6 cm stool ball within the mid rectum. Diffuse sigmoid diverticulosis with no acute diverticulitis. 5. A 1.9 cm right thyroid gland nodule Recommend thyroid US  (ref: J Am Coll Radiol. 2015 Feb;12(2): 143-50). In the setting of significant comorbidities or limited life expectancy, no follow-up recommended (ref: J Am Coll Radiol. 2015 Feb;12(2): 143-50). Electronically Signed   By: Tish Frederickson M.D.   On: 11/05/2020 20:20   (Echo, Carotid, EGD, Colonoscopy, ERCP)  Subjective: Seen and examined on day of discharge.  Belly soft.  Patient in no distress.  Discharge Exam: Vitals:   11/08/20 0510 11/08/20 0732  BP: (!) 100/49 (!) 111/42  Pulse: (!) 54 70  Resp: 17 18  Temp: 98.3 F (36.8 C)   SpO2: 98% 100%   Vitals:   11/07/20 1824 11/08/20 0045 11/08/20 0510 11/08/20 0732  BP: (!) 165/61 (!) 104/40 (!) 100/49 (!) 111/42  Pulse: 62 (!) 59 (!) 54 70  Resp: 17 19 17 18   Temp: 98.3 F (36.8 C) 97.8 F (36.6 C) 98.3 F (36.8 C)   TempSrc: Oral Oral Oral   SpO2: 94% 100% 98% 100%  Weight:      Height:        General: Pt is alert, awake, not in acute distress Cardiovascular: RRR, S1/S2 +, no rubs, no gallops Respiratory: CTA bilaterally, no wheezing, no rhonchi Abdominal: Soft, NT, ND, bowel sounds + Extremities: no edema, no cyanosis    The results of significant diagnostics from this hospitalization (including imaging, microbiology, ancillary and laboratory) are listed below for reference.     Microbiology: Recent Results (from the past 240 hour(s))  Resp Panel by RT-PCR (Flu A&B, Covid) Nasopharyngeal Swab     Status: None   Collection Time: 11/05/20  3:35 PM   Specimen: Nasopharyngeal Swab; Nasopharyngeal(NP) swabs in vial transport medium  Result Value Ref Range Status   SARS Coronavirus 2 by RT PCR NEGATIVE NEGATIVE Final    Comment: (NOTE) SARS-CoV-2 target nucleic acids are NOT DETECTED.  The SARS-CoV-2 RNA is generally detectable in upper respiratory specimens during the acute phase of infection. The lowest concentration of SARS-CoV-2 viral copies this assay can detect is 138 copies/mL. A negative result does not  preclude SARS-Cov-2 infection and should not be used as the sole basis for treatment or other patient management decisions. A negative result may occur with  improper specimen collection/handling, submission of specimen other than nasopharyngeal swab, presence of viral mutation(s) within the areas targeted by this assay, and inadequate number of viral copies(<138 copies/mL). A negative result must be combined with clinical observations, patient history, and epidemiological information. The expected result is Negative.  Fact Sheet for Patients:  11/07/20  Fact Sheet for Healthcare Providers:  BloggerCourse.com  This test is no t yet approved or cleared by the [employer] FDA and  has been authorized for detection and/or diagnosis of SARS-CoV-2 by FDA under an Emergency Use Authorization (EUA). This EUA will remain  in effect (meaning this test can be used) for the duration of the COVID-19 declaration under Section 564(b)(1) of the Act, 21 U.S.C.section 360bbb-3(b)(1), unless the authorization is terminated  or revoked sooner.       Influenza A by PCR NEGATIVE NEGATIVE Final   Influenza B by PCR NEGATIVE NEGATIVE Final    Comment: (NOTE) The Xpert Xpress SARS-CoV-2/FLU/RSV plus assay is intended as an aid in the diagnosis of influenza from Nasopharyngeal swab specimens and should not be used as a sole basis for treatment. Nasal washings and aspirates are unacceptable for Xpert Xpress SARS-CoV-2/FLU/RSV testing.  Fact Sheet for Patients: [place]  Fact Sheet for Healthcare Providers: BloggerCourse.com  This test is not yet approved or cleared by the [employer] FDA and has been authorized for detection and/or diagnosis of SARS-CoV-2 by FDA under an Emergency Use Authorization (EUA). This EUA will remain in effect (meaning this test can be used) for the  duration of the COVID-19 declaration under Section 564(b)(1) of  the Act, 21 U.S.C. section 360bbb-3(b)(1), unless the authorization is terminated or revoked.  Performed at [facility], [address] [place], [place] [address]      Labs: BNP (last 3 results) No results for input(s): BNP in the last 8760 hours. Basic Metabolic Panel: Recent Labs  Lab 11/05/20 1340 11/06/20 0737  NA 131*  --   K 5.2*  --   CL 98  --   CO2 28  --   GLUCOSE 78  --   BUN 23  --   CREATININE 1.40*  --   CALCIUM 9.4  --   MG  --  2.6*   Liver Function Tests: No results for input(s): AST, ALT, ALKPHOS, BILITOT, PROT, ALBUMIN in the last 168 hours. No results for input(s): LIPASE, AMYLASE in the last 168 hours. No results for input(s): AMMONIA in the last 168 hours. CBC: Recent Labs  Lab 11/05/20 1340  WBC 4.8  HGB 12.7  HCT 40.0  MCV 87.1  PLT 267   Cardiac Enzymes: No results for input(s): CKTOTAL, CKMB, CKMBINDEX, TROPONINI in the last 168 hours. BNP: Invalid input(s): POCBNP CBG: No results for input(s): GLUCAP in the last 168 hours. D-Dimer No results for input(s): DDIMER in the last 72 hours. Hgb A1c No results for input(s): HGBA1C in the last 72 hours. Lipid Profile Recent Labs    11/06/20 0737  CHOL 172  HDL 48  LDLCALC 104*  TRIG 101  CHOLHDL 3.6   Thyroid function studies Recent Labs    11/05/20 1535  TSH 0.548   Anemia work up Recent Labs    11/06/20 0737  VITAMINB12 198   Urinalysis    Component Value Date/Time   COLORURINE STRAW (A) 11/05/2020 1535   APPEARANCEUR CLEAR (A) 11/05/2020 1535   LABSPEC 1.005 11/05/2020 1535   PHURINE 6.0 11/05/2020 1535   GLUCOSEU NEGATIVE 11/05/2020 1535   HGBUR NEGATIVE 11/05/2020 1535   BILIRUBINUR NEGATIVE 11/05/2020 1535   KETONESUR NEGATIVE 11/05/2020 1535   PROTEINUR NEGATIVE 11/05/2020 1535   NITRITE NEGATIVE 11/05/2020 1535   LEUKOCYTESUR NEGATIVE 11/05/2020 1535   Sepsis Labs Invalid  input(s): PROCALCITONIN,  WBC,  LACTICIDVEN Microbiology Recent Results (from the past 240 hour(s))  Resp Panel by RT-PCR (Flu A&B, Covid) Nasopharyngeal Swab     Status: None   Collection Time: 11/05/20  3:35 PM   Specimen: Nasopharyngeal Swab; Nasopharyngeal(NP) swabs in vial transport medium  Result Value Ref Range Status   SARS Coronavirus 2 by RT PCR NEGATIVE NEGATIVE Final    Comment: (NOTE) SARS-CoV-2 target nucleic acids are NOT DETECTED.  The SARS-CoV-2 RNA is generally detectable in upper respiratory specimens during the acute phase of infection. The lowest concentration of SARS-CoV-2 viral copies this assay can detect is 138 copies/mL. A negative result does not preclude SARS-Cov-2 infection and should not be used as the sole basis for treatment or other patient management decisions. A negative result may occur with  improper specimen collection/handling, submission of specimen other than nasopharyngeal swab, presence of viral mutation(s) within the areas targeted by this assay, and inadequate number of viral copies(<138 copies/mL). A negative result must be combined with clinical observations, patient history, and epidemiological information. The expected result is Negative.  Fact Sheet for Patients:  BloggerCourse.com  Fact Sheet for Healthcare Providers:  SeriousBroker.it  This test is no t yet approved or cleared by the [place] FDA and  has been authorized for detection and/or diagnosis of SARS-CoV-2 by FDA under an Emergency Use  Authorization (EUA). This EUA will remain  in effect (meaning this test can be used) for the duration of the COVID-19 declaration under Section 564(b)(1) of the Act, 21 U.S.C.section 360bbb-3(b)(1), unless the authorization is terminated  or revoked sooner.       Influenza A by PCR NEGATIVE NEGATIVE Final   Influenza B by PCR NEGATIVE NEGATIVE Final    Comment: (NOTE) The Xpert  Xpress SARS-CoV-2/FLU/RSV plus assay is intended as an aid in the diagnosis of influenza from Nasopharyngeal swab specimens and should not be used as a sole basis for treatment. Nasal washings and aspirates are unacceptable for Xpert Xpress SARS-CoV-2/FLU/RSV testing.  Fact Sheet for Patients: BloggerCourse.com  Fact Sheet for Healthcare Providers: SeriousBroker.it  This test is not yet approved or cleared by the [place] FDA and has been authorized for detection and/or diagnosis of SARS-CoV-2 by FDA under an Emergency Use Authorization (EUA). This EUA will remain in effect (meaning this test can be used) for the duration of the COVID-19 declaration under Section 564(b)(1) of the Act, 21 U.S.C. section 360bbb-3(b)(1), unless the authorization is terminated or revoked.  Performed at [facility], [address], [place], [place] [address]      Time coordinating discharge: Over 30 minutes  SIGNED:   Tresa Moore, MD  [facility] 11/08/2020, 10:18 AM Pager   If 7PM-7AM, please contact night-coverage

## 2020-11-08 NOTE — TOC Transition Note (Addendum)
Transition of Care [facility]) - CM/SW Discharge Note   Patient Details  Name: Katrina Chen MRN: [id number] Date of Birth: [birth year]  Transition of Care [facility]) CM/SW Contact:  Gildardo Griffes, LCSW Phone Number: 11/08/2020, 12:14 PM   Clinical Narrative:     [facility] Anticipated DC date: 11/08/20 Family notified: daughter [name]- attempted call but mailbox is full Transport by: Wendie Simmer  Per MD patient ready for DC to  [facility]. RN, patient, patient's family, and facility notified of DC. Discharge Summary sent to facility. RN given number for report   [phone]. DC packet on chart. Ambulance transport requested for patient.  CSW signing off.  Angeline Slim, LCSW    Final next level of care: Skilled Nursing Facility Barriers to Discharge: No Barriers Identified   Patient Goals and CMS Choice Patient states their goals for this hospitalization and ongoing recovery are:: to go to SNF CMS Medicare.gov Compare Post Acute Care list provided to:: Patient Represenative (must comment) (daughter [name]) Choice offered to / list presented to : Adult Children  Discharge Placement              Patient chooses bed at: [facility] [place] Patient to be transferred to facility by: [facility] Name of family member notified: daughter [name] Patient and family notified of of transfer: 11/08/20  Discharge Plan and Services                                     Social Determinants of Health (SDOH) Interventions     Readmission Risk Interventions No flowsheet data found.

## 2020-11-08 NOTE — NC FL2 (Signed)
[place] MEDICAID FL2 LEVEL OF CARE SCREENING TOOL     IDENTIFICATION  Patient Name: Katrina Chen Birthdate: 12-19-1935 Sex: female Admission Date (Current Location): 11/05/2020  [facility] and [place] Number:  Chiropodist and Address:  [facility], [address], [place], [place] [address]      Provider Number: [id number]  Attending Physician Name and Address:  Tresa Moore, MD  Relative Name and Phone Number:  [name] (daughter) [phone]    Current Level of Care: Hospital Recommended Level of Care: Skilled Nursing Facility Prior Approval Number:    Date Approved/Denied:   PASRR Number: [id number]  Discharge Plan: SNF    Current Diagnoses: Patient Active Problem List   Diagnosis Date Noted   Chest pain 11/05/2020   CAD (coronary artery disease) 11/05/2020   Hyperkalemia 11/05/2020   Obstipation 11/05/2020   History of bipolar disorder 11/05/2020   Atrial fibrillation, chronic (HCC) 11/05/2020   CKD (chronic kidney disease), stage III (HCC) 11/05/2020   Frail elderly 11/05/2020   Weakness 11/05/2020   Protein-calorie malnutrition (HCC) 11/05/2020    Orientation RESPIRATION BLADDER Height & Weight     Self, Situation  Normal Incontinent, External catheter Weight: 169 lb (76.7 kg) Height:  5\' 7"  (170.2 cm)  BEHAVIORAL SYMPTOMS/MOOD NEUROLOGICAL BOWEL NUTRITION STATUS      Incontinent Diet (see discharge summary)  AMBULATORY STATUS COMMUNICATION OF NEEDS Skin   Extensive Assist Verbally Normal                       Personal Care Assistance Level of Assistance  Bathing, Feeding, Dressing, Total care Bathing Assistance: Limited assistance Feeding assistance: Limited assistance Dressing Assistance: Limited assistance Total Care Assistance: Maximum assistance   Functional Limitations Info  Sight, Hearing, Speech Sight Info: Adequate Hearing Info: Adequate Speech Info: Adequate    SPECIAL CARE FACTORS  FREQUENCY  PT (By licensed PT), OT (By licensed OT)     PT Frequency: min 4x weekly OT Frequency: min 4x weekly            Contractures Contractures Info: Not present    Additional Factors Info  Code Status, Allergies Code Status Info: DNR Allergies Info: Iodine, amlodipine, nifedipine, lisinopril, quinapril, shellfish allergy, simvastatin           Current Medications (11/08/2020):  This is the current hospital active medication list Current Facility-Administered Medications  Medication Dose Route Frequency Provider Last Rate Last Admin   acetaminophen (TYLENOL) suppository 650 mg  650 mg Rectal Q6H PRN Cox, Amy N, DO       acetaminophen (TYLENOL) tablet 650 mg  650 mg Oral Q4H PRN Cox, Amy N, DO   650 mg at 11/07/20 0047   ALPRAZolam 11/09/20) tablet 0.25 mg  0.25 mg Oral BID PRN Cox, Amy N, DO   0.25 mg at 11/07/20 2003   apixaban (ELIQUIS) tablet 2.5 mg  2.5 mg Oral BID 2004 B, MD   2.5 mg at 11/07/20 2126   bisacodyl (DULCOLAX) suppository 10 mg  10 mg Rectal Daily PRN 2127 B, MD       carvedilol (COREG) tablet 12.5 mg  12.5 mg Oral BID WC Sreenath, Sudheer B, MD   12.5 mg at 11/07/20 1814   celecoxib (CELEBREX) capsule 100 mg  100 mg Oral BID 11/09/20 B, MD   100 mg at 11/07/20 2127   cloNIDine (CATAPRES) tablet 0.1 mg  0.1 mg Oral BID 2128, MD  0.1 mg at 11/07/20 2125   diclofenac Sodium (VOLTAREN) 1 % topical gel 2 g  2 g Topical QID Mansy, Jan A, MD   2 g at 11/07/20 2124   diltiazem (CARDIZEM) injection 10 mg  10 mg Intravenous BID PRN Cox, Amy N, DO       DULoxetine (CYMBALTA) DR capsule 60 mg  60 mg Oral BID Lolita Patella B, MD   60 mg at 11/07/20 2125   feeding supplement (BOOST / RESOURCE BREEZE) liquid 1 Container  1 Container Oral TID BM Cox, Amy N, DO   1 Container at 11/07/20 2127   gabapentin (NEURONTIN) capsule 100 mg  100 mg Oral QHS Sreenath, Sudheer B, MD   100 mg at 11/07/20 2129   lidocaine (LIDODERM) 5  % 1 patch  1 patch Transdermal Q24H Mansy, Jan A, MD   1 patch at 11/07/20 2003   loratadine (CLARITIN) tablet 10 mg  10 mg Oral Daily Sreenath, Sudheer B, MD       LORazepam (ATIVAN) injection 1 mg  1 mg Intravenous Once Cox, Amy N, DO       losartan (COZAAR) tablet 100 mg  100 mg Oral Daily Georgeann [place], Sudheer B, MD   100 mg at 11/07/20 1814   melatonin tablet 10 mg  10 mg Oral QHS Sreenath, Sudheer B, MD   10 mg at 11/07/20 2125   morphine 2 MG/ML injection 1-2 mg  1-2 mg Intravenous Q4H PRN Mansy, Jan A, MD       OLANZapine (ZYPREXA) tablet 2.5 mg  2.5 mg Oral QHS Sreenath, Sudheer B, MD   2.5 mg at 11/07/20 2130   olopatadine (PATANOL) 0.1 % ophthalmic solution 1 drop  1 drop Both Eyes BID Lolita Patella B, MD   1 drop at 11/07/20 2132   ondansetron (ZOFRAN) injection 4 mg  4 mg Intravenous Q6H PRN Cox, Amy N, DO       oxyCODONE (Oxy IR/ROXICODONE) immediate release tablet 5 mg  5 mg Oral Q4H PRN Georgeann [place], Sudheer B, MD   5 mg at 11/07/20 2003   polyethylene glycol (MIRALAX / GLYCOLAX) packet 17 g  17 g Oral Daily Georgeann Oppen[place], Sudheer B, MD   17 g at 11/07/20 1003   senna-docusate (Senokot-S) tablet 1 tablet  1 tablet Oral BID Lolita Patella B, MD   1 tablet at 11/07/20 2125   sodium phosphate (FLEET) 7-19 GM/118ML enema 1 enema  1 enema Rectal Daily PRN Lolita Patella B, MD   1 enema at 11/06/20 1450   sorbitol, milk of mag, mineral oil, glycerin (SMOG) enema  960 mL Rectal Once Lolita Patella B, MD       traMADol Janean Sark) tablet 50 mg  50 mg Oral Q6H PRN Lolita Patella B, MD   50 mg at 11/08/20 0040     Discharge Medications: Please see discharge summary for a list of discharge medications.  Relevant Imaging Results:  Relevant Lab Results:   Additional Information SSN: [id number]  Gildardo Griffes, LCSW

## 2020-11-08 NOTE — Progress Notes (Signed)
Tried for 4th time to call report to [facility] and it went to voicemail.

## 2020-11-08 NOTE — TOC Progression Note (Signed)
Transition of Care [facility]) - Progression Note    Patient Details  Name: Katrina Chen MRN: [id number] Date of Birth: Sep 21, 1935  Transition of Care [facility]) CM/SW Contact  Gildardo Griffes, [place] Phone Number: 11/08/2020, 8:48 AM  Clinical Narrative:      CSW confirmed with [name] at [facility] that they can accept patient today. MD informed. Pending medical readiness to dc.       Expected Discharge Plan and Services                                                 Social Determinants of Health (SDOH) Interventions    Readmission Risk Interventions No flowsheet data found.

## 2020-11-08 NOTE — Progress Notes (Signed)
Report called 3 different times to [facility] and put on hold for over 5 min each time. Was unable to reach anyone to take report. Transport has already been called by case management.

## 2020-11-12 ENCOUNTER — Emergency Department: Payer: Medicare Other

## 2020-11-12 ENCOUNTER — Other Ambulatory Visit: Payer: Self-pay

## 2020-11-12 ENCOUNTER — Inpatient Hospital Stay
Admission: EM | Admit: 2020-11-12 | Discharge: 2020-11-17 | DRG: 644 | Disposition: A | Discharge status: Skilled Nursing Facility | Payer: Medicare Other | Attending: Internal Medicine | Admitting: Internal Medicine

## 2020-11-12 DIAGNOSIS — M48061 Spinal stenosis, lumbar region without neurogenic claudication: Secondary | ICD-10-CM | POA: Diagnosis present

## 2020-11-12 DIAGNOSIS — N183 Chronic kidney disease, stage 3 unspecified: Secondary | ICD-10-CM | POA: Diagnosis present

## 2020-11-12 DIAGNOSIS — E222 Syndrome of inappropriate secretion of antidiuretic hormone: Principal | ICD-10-CM | POA: Diagnosis present

## 2020-11-12 DIAGNOSIS — Z993 Dependence on wheelchair: Secondary | ICD-10-CM

## 2020-11-12 DIAGNOSIS — R9431 Abnormal electrocardiogram [ECG] [EKG]: Secondary | ICD-10-CM | POA: Diagnosis not present

## 2020-11-12 DIAGNOSIS — Z6829 Body mass index (BMI) 29.0-29.9, adult: Secondary | ICD-10-CM

## 2020-11-12 DIAGNOSIS — E878 Other disorders of electrolyte and fluid balance, not elsewhere classified: Secondary | ICD-10-CM | POA: Diagnosis present

## 2020-11-12 DIAGNOSIS — E785 Hyperlipidemia, unspecified: Secondary | ICD-10-CM | POA: Diagnosis present

## 2020-11-12 DIAGNOSIS — F319 Bipolar disorder, unspecified: Secondary | ICD-10-CM | POA: Diagnosis present

## 2020-11-12 DIAGNOSIS — I129 Hypertensive chronic kidney disease with stage 1 through stage 4 chronic kidney disease, or unspecified chronic kidney disease: Secondary | ICD-10-CM | POA: Diagnosis present

## 2020-11-12 DIAGNOSIS — R627 Adult failure to thrive: Secondary | ICD-10-CM | POA: Diagnosis present

## 2020-11-12 DIAGNOSIS — R112 Nausea with vomiting, unspecified: Secondary | ICD-10-CM

## 2020-11-12 DIAGNOSIS — R339 Retention of urine, unspecified: Secondary | ICD-10-CM | POA: Diagnosis present

## 2020-11-12 DIAGNOSIS — Z90710 Acquired absence of both cervix and uterus: Secondary | ICD-10-CM

## 2020-11-12 DIAGNOSIS — E871 Hypo-osmolality and hyponatremia: Secondary | ICD-10-CM | POA: Diagnosis present

## 2020-11-12 DIAGNOSIS — I2510 Atherosclerotic heart disease of native coronary artery without angina pectoris: Secondary | ICD-10-CM | POA: Diagnosis present

## 2020-11-12 DIAGNOSIS — Z888 Allergy status to other drugs, medicaments and biological substances status: Secondary | ICD-10-CM

## 2020-11-12 DIAGNOSIS — Z7901 Long term (current) use of anticoagulants: Secondary | ICD-10-CM | POA: Diagnosis not present

## 2020-11-12 DIAGNOSIS — K5900 Constipation, unspecified: Secondary | ICD-10-CM | POA: Diagnosis present

## 2020-11-12 DIAGNOSIS — E875 Hyperkalemia: Secondary | ICD-10-CM | POA: Diagnosis present

## 2020-11-12 DIAGNOSIS — Z66 Do not resuscitate: Secondary | ICD-10-CM | POA: Diagnosis present

## 2020-11-12 DIAGNOSIS — I482 Chronic atrial fibrillation, unspecified: Secondary | ICD-10-CM | POA: Diagnosis present

## 2020-11-12 DIAGNOSIS — G629 Polyneuropathy, unspecified: Secondary | ICD-10-CM | POA: Diagnosis present

## 2020-11-12 DIAGNOSIS — Z79899 Other long term (current) drug therapy: Secondary | ICD-10-CM | POA: Diagnosis not present

## 2020-11-12 DIAGNOSIS — E669 Obesity, unspecified: Secondary | ICD-10-CM | POA: Diagnosis present

## 2020-11-12 DIAGNOSIS — K5730 Diverticulosis of large intestine without perforation or abscess without bleeding: Secondary | ICD-10-CM | POA: Diagnosis present

## 2020-11-12 DIAGNOSIS — R778 Other specified abnormalities of plasma proteins: Secondary | ICD-10-CM | POA: Diagnosis present

## 2020-11-12 DIAGNOSIS — Z20822 Contact with and (suspected) exposure to covid-19: Secondary | ICD-10-CM | POA: Diagnosis present

## 2020-11-12 DIAGNOSIS — I10 Essential (primary) hypertension: Secondary | ICD-10-CM | POA: Diagnosis present

## 2020-11-12 DIAGNOSIS — Z91013 Allergy to seafood: Secondary | ICD-10-CM

## 2020-11-12 DIAGNOSIS — I4892 Unspecified atrial flutter: Secondary | ICD-10-CM | POA: Diagnosis not present

## 2020-11-12 DIAGNOSIS — R1084 Generalized abdominal pain: Secondary | ICD-10-CM

## 2020-11-12 DIAGNOSIS — Z8659 Personal history of other mental and behavioral disorders: Secondary | ICD-10-CM

## 2020-11-12 DIAGNOSIS — Z79891 Long term (current) use of opiate analgesic: Secondary | ICD-10-CM

## 2020-11-12 DIAGNOSIS — R54 Age-related physical debility: Secondary | ICD-10-CM | POA: Diagnosis present

## 2020-11-12 DIAGNOSIS — Z950 Presence of cardiac pacemaker: Secondary | ICD-10-CM | POA: Diagnosis not present

## 2020-11-12 DIAGNOSIS — F0150 Vascular dementia without behavioral disturbance: Secondary | ICD-10-CM | POA: Diagnosis present

## 2020-11-12 DIAGNOSIS — N1831 Chronic kidney disease, stage 3a: Secondary | ICD-10-CM | POA: Diagnosis present

## 2020-11-12 HISTORY — DX: Hyperlipidemia, unspecified: E78.5

## 2020-11-12 HISTORY — DX: Iron deficiency: E61.1

## 2020-11-12 HISTORY — DX: Vascular dementia, unspecified severity, without behavioral disturbance, psychotic disturbance, mood disturbance, and anxiety: F01.50

## 2020-11-12 HISTORY — DX: Unspecified atrial fibrillation: I48.91

## 2020-11-12 LAB — CBC WITH DIFFERENTIAL/PLATELET
Abs Immature Granulocytes: 0.03 10*3/uL (ref 0.00–0.07)
Basophils Absolute: 0 10*3/uL (ref 0.0–0.1)
Basophils Relative: 0 %
Eosinophils Absolute: 0 10*3/uL (ref 0.0–0.5)
Eosinophils Relative: 0 %
HCT: 36.6 % (ref 36.0–46.0)
Hemoglobin: 12.1 g/dL (ref 12.0–15.0)
Immature Granulocytes: 1 %
Lymphocytes Relative: 16 %
Lymphs Abs: 1.1 10*3/uL (ref 0.7–4.0)
MCH: 27.9 pg (ref 26.0–34.0)
MCHC: 33.1 g/dL (ref 30.0–36.0)
MCV: 84.5 fL (ref 80.0–100.0)
Monocytes Absolute: 0.5 10*3/uL (ref 0.1–1.0)
Monocytes Relative: 8 %
Neutro Abs: 5.1 10*3/uL (ref 1.7–7.7)
Neutrophils Relative %: 75 %
Platelets: 257 10*3/uL (ref 150–400)
RBC: 4.33 MIL/uL (ref 3.87–5.11)
RDW: 12.4 % (ref 11.5–15.5)
WBC: 6.7 10*3/uL (ref 4.0–10.5)
nRBC: 0 % (ref 0.0–0.2)

## 2020-11-12 LAB — URINALYSIS, COMPLETE (UACMP) WITH MICROSCOPIC
Bilirubin Urine: NEGATIVE
Glucose, UA: NEGATIVE mg/dL
Ketones, ur: NEGATIVE mg/dL
Nitrite: NEGATIVE
Protein, ur: NEGATIVE mg/dL
Specific Gravity, Urine: 1.02 (ref 1.005–1.030)
Squamous Epithelial / HPF: >50 — ABNORMAL HIGH (ref 0–5)
pH: 5 (ref 5.0–8.0)

## 2020-11-12 LAB — COMPREHENSIVE METABOLIC PANEL
ALT: 12 U/L (ref 0–44)
AST: 21 U/L (ref 15–41)
Albumin: 3.4 g/dL — ABNORMAL LOW (ref 3.5–5.0)
Alkaline Phosphatase: 59 U/L (ref 38–126)
Anion gap: 7 (ref 5–15)
BUN: 20 mg/dL (ref 8–23)
CO2: 24 mmol/L (ref 22–32)
Calcium: 9.5 mg/dL (ref 8.9–10.3)
Chloride: 90 mmol/L — ABNORMAL LOW (ref 98–111)
Creatinine, Ser: 1.18 mg/dL — ABNORMAL HIGH (ref 0.44–1.00)
GFR, Estimated: 46 mL/min — ABNORMAL LOW (ref >60)
Glucose, Bld: 103 mg/dL — ABNORMAL HIGH (ref 70–99)
Potassium: 5.6 mmol/L — ABNORMAL HIGH (ref 3.5–5.1)
Sodium: 121 mmol/L — ABNORMAL LOW (ref 135–145)
Total Bilirubin: 0.7 mg/dL (ref 0.3–1.2)
Total Protein: 6.7 g/dL (ref 6.5–8.1)

## 2020-11-12 LAB — BASIC METABOLIC PANEL
Anion gap: 6 (ref 5–15)
BUN: 20 mg/dL (ref 8–23)
CO2: 26 mmol/L (ref 22–32)
Calcium: 9.9 mg/dL (ref 8.9–10.3)
Chloride: 91 mmol/L — ABNORMAL LOW (ref 98–111)
Creatinine, Ser: 1.2 mg/dL — ABNORMAL HIGH (ref 0.44–1.00)
GFR, Estimated: 45 mL/min — ABNORMAL LOW (ref >60)
Glucose, Bld: 98 mg/dL (ref 70–99)
Potassium: 5.2 mmol/L — ABNORMAL HIGH (ref 3.5–5.1)
Sodium: 123 mmol/L — ABNORMAL LOW (ref 135–145)

## 2020-11-12 LAB — LIPASE, BLOOD: Lipase: 20 U/L (ref 11–51)

## 2020-11-12 LAB — TROPONIN I (HIGH SENSITIVITY)
Troponin I (High Sensitivity): 42 ng/L — ABNORMAL HIGH (ref <18)
Troponin I (High Sensitivity): 38 ng/L — ABNORMAL HIGH (ref <18)

## 2020-11-12 LAB — SEDIMENTATION RATE: Sed Rate: 34 mm/hr — ABNORMAL HIGH (ref 0–30)

## 2020-11-12 LAB — CK: Total CK: 98 U/L (ref 38–234)

## 2020-11-12 LAB — OSMOLALITY: Osmolality: 262 mOsm/kg — ABNORMAL LOW (ref 275–295)

## 2020-11-12 LAB — SODIUM, URINE, RANDOM: Sodium, Ur: 52 mmol/L

## 2020-11-12 LAB — OSMOLALITY, URINE: Osmolality, Ur: 484 mOsm/kg (ref 300–900)

## 2020-11-12 MED ORDER — FLEET ENEMA 7-19 GM/118ML RE ENEM: 1.0000 | ENEMA | Freq: Once | RECTAL | Status: DC | PRN

## 2020-11-12 MED ORDER — PATIROMER SORBITEX CALCIUM 8.4 G PO PACK
8.4000 g | PACK | Freq: Every day | ORAL | Status: DC
  Administered 2020-11-12 – 2020-11-17 (×5): 8.4 g via ORAL
  Filled 2020-11-12 (×9): qty 1

## 2020-11-12 MED ORDER — ASPIRIN 81 MG PO CHEW
325.0000 mg | CHEWABLE_TABLET | Freq: Once | ORAL | Status: AC
  Administered 2020-11-12: 325 mg via ORAL
  Filled 2020-11-12: qty 5

## 2020-11-12 MED ORDER — LOSARTAN POTASSIUM 50 MG PO TABS
100.0000 mg | ORAL_TABLET | Freq: Every day | ORAL | Status: DC
  Administered 2020-11-12 – 2020-11-13 (×2): 100 mg via ORAL
  Filled 2020-11-12 (×2): qty 2

## 2020-11-12 MED ORDER — POLYETHYLENE GLYCOL 3350 17 G PO PACK: 17.0000 g | PACK | Freq: Every day | ORAL | Status: DC | PRN

## 2020-11-12 MED ORDER — ACETAMINOPHEN 650 MG RE SUPP: 650.0000 mg | Freq: Four times a day (QID) | RECTAL | Status: DC | PRN

## 2020-11-12 MED ORDER — OXYCODONE HCL 5 MG PO TABS
5.0000 mg | ORAL_TABLET | ORAL | Status: DC | PRN
  Administered 2020-11-12 – 2020-11-14 (×4): 5 mg via ORAL
  Filled 2020-11-12 (×5): qty 1

## 2020-11-12 MED ORDER — APIXABAN 2.5 MG PO TABS
2.5000 mg | ORAL_TABLET | Freq: Two times a day (BID) | ORAL | Status: DC
  Administered 2020-11-12 – 2020-11-17 (×10): 2.5 mg via ORAL
  Filled 2020-11-12 (×11): qty 1

## 2020-11-12 MED ORDER — TRAZODONE HCL 50 MG PO TABS
25.0000 mg | ORAL_TABLET | Freq: Every evening | ORAL | Status: DC | PRN
  Administered 2020-11-16: 25 mg via ORAL
  Filled 2020-11-12 (×2): qty 1

## 2020-11-12 MED ORDER — CARVEDILOL 6.25 MG PO TABS
12.5000 mg | ORAL_TABLET | Freq: Two times a day (BID) | ORAL | Status: DC
  Administered 2020-11-12 – 2020-11-13 (×2): 12.5 mg via ORAL
  Filled 2020-11-12 (×2): qty 2

## 2020-11-12 MED ORDER — SODIUM CHLORIDE 0.9% FLUSH
3.0000 mL | Freq: Two times a day (BID) | INTRAVENOUS | Status: DC
  Administered 2020-11-12 – 2020-11-17 (×10): 3 mL via INTRAVENOUS

## 2020-11-12 MED ORDER — ONDANSETRON HCL 4 MG/2ML IJ SOLN
4.0000 mg | Freq: Once | INTRAMUSCULAR | Status: AC
  Administered 2020-11-12: 4 mg via INTRAVENOUS
  Filled 2020-11-12: qty 2

## 2020-11-12 MED ORDER — OLOPATADINE HCL 0.1 % OP SOLN
1.0000 [drp] | Freq: Two times a day (BID) | OPHTHALMIC | Status: DC
  Administered 2020-11-12 – 2020-11-17 (×10): 1 [drp] via OPHTHALMIC
  Filled 2020-11-12: qty 5

## 2020-11-12 MED ORDER — ONDANSETRON HCL 4 MG PO TABS: 4.0000 mg | ORAL_TABLET | Freq: Four times a day (QID) | ORAL | Status: DC | PRN

## 2020-11-12 MED ORDER — ONDANSETRON HCL 4 MG/2ML IJ SOLN: 4.0000 mg | Freq: Four times a day (QID) | INTRAMUSCULAR | Status: DC | PRN

## 2020-11-12 MED ORDER — CELECOXIB 100 MG PO CAPS
100.0000 mg | ORAL_CAPSULE | Freq: Two times a day (BID) | ORAL | Status: DC
  Administered 2020-11-13 – 2020-11-17 (×10): 100 mg via ORAL
  Filled 2020-11-12 (×12): qty 1

## 2020-11-12 MED ORDER — GABAPENTIN 100 MG PO CAPS
100.0000 mg | ORAL_CAPSULE | Freq: Every day | ORAL | Status: DC
  Administered 2020-11-13 – 2020-11-17 (×5): 100 mg via ORAL
  Filled 2020-11-12 (×5): qty 1

## 2020-11-12 MED ORDER — ACETAMINOPHEN 325 MG PO TABS
650.0000 mg | ORAL_TABLET | Freq: Four times a day (QID) | ORAL | Status: DC | PRN
  Administered 2020-11-15: 650 mg via ORAL
  Filled 2020-11-12: qty 2

## 2020-11-12 MED ORDER — CLONIDINE HCL 0.1 MG PO TABS
0.1000 mg | ORAL_TABLET | Freq: Two times a day (BID) | ORAL | Status: DC
  Administered 2020-11-12 – 2020-11-13 (×2): 0.1 mg via ORAL
  Filled 2020-11-12 (×2): qty 1

## 2020-11-12 MED ORDER — MELATONIN 5 MG PO TABS
10.0000 mg | ORAL_TABLET | Freq: Every day | ORAL | Status: DC
  Administered 2020-11-12 – 2020-11-16 (×5): 10 mg via ORAL
  Filled 2020-11-12 (×6): qty 2

## 2020-11-12 MED ORDER — LACTATED RINGERS IV BOLUS
1000.0000 mL | Freq: Once | INTRAVENOUS | Status: AC
  Administered 2020-11-12: 1000 mL via INTRAVENOUS

## 2020-11-12 MED ORDER — BISACODYL 10 MG RE SUPP
10.0000 mg | Freq: Every day | RECTAL | Status: DC | PRN
  Filled 2020-11-12: qty 1

## 2020-11-12 MED ORDER — SODIUM CHLORIDE 1 G PO TABS
1.0000 g | ORAL_TABLET | Freq: Three times a day (TID) | ORAL | Status: DC
  Administered 2020-11-13 – 2020-11-17 (×13): 1 g via ORAL
  Filled 2020-11-12 (×14): qty 1

## 2020-11-12 MED ORDER — LORATADINE 10 MG PO TABS
10.0000 mg | ORAL_TABLET | Freq: Every day | ORAL | Status: DC
  Administered 2020-11-13 – 2020-11-17 (×5): 10 mg via ORAL
  Filled 2020-11-12 (×5): qty 1

## 2020-11-12 NOTE — ED Notes (Signed)
ED TO INPATIENT HANDOFF REPORT  ED Nurse Name and Phone #:  Fabio Neighbors [phone]  S Name/Age/Gender Katrina Chen 85 y.o. female Room/Bed: ED32A/ED32A  Code Status   Code Status: Prior  Home/SNF/Other Nursing Home Patient oriented to: self Is this baseline? Yes   Triage Complete: Triage complete  Chief Complaint Hyponatremia [E87.1]  Triage Note No notes on file   Allergies Allergies  Allergen Reactions  . Iodine Swelling  . Amlodipine Other (See Comments)  . Nifedipine Swelling and Other (See Comments)  . Lisinopril Swelling  . Quinapril Swelling  . Shellfish Allergy   . Simvastatin Swelling    Level of Care/Admitting Diagnosis ED Disposition    ED Disposition  Admit   Condition  --   Comment  Hospital Area: [place] [[id number]]  Level of Care: Med-Surg [16]  Covid Evaluation: Asymptomatic Screening Protocol (No Symptoms)  Diagnosis: Hyponatremia [354656]  Admitting Physician: Clarnce Flock [[id number]]  Attending Physician: Clarnce Flock [[id number]]  Estimated length of stay: past midnight tomorrow  Certification:: I certify this patient will need inpatient services for at least 2 midnights         B Medical/Surgery History Past Medical History:  Diagnosis Date  . A-fib ([place])   . Hyperlipidemia   . Hypertension   . Iron deficiency   . Vascular dementia [facility])    Past Surgical History:  Procedure Laterality Date  . PACEMAKER IMPLANT       A IV Location/Drains/Wounds Patient Lines/Drains/Airways Status    Active Line/Drains/Airways    Name Placement date Placement time Site Days   Peripheral IV 11/12/20 20 G Left Antecubital 11/12/20  1305  Antecubital  less than 1          Intake/Output Last 24 hours No intake or output data in the 24 hours ending 11/12/20 1943  Labs/Imaging Results for orders placed or performed during the hospital encounter of 11/12/20 (from the past 48 hour(s))  CBC with  Differential     Status: None   Collection Time: 11/12/20 12:59 PM  Result Value Ref Range   WBC 6.7 4.0 - 10.5 K/uL   RBC 4.33 3.87 - 5.11 MIL/uL   Hemoglobin 12.1 12.0 - 15.0 g/dL   HCT 36.6 36.0 - 46.0 %   MCV 84.5 80.0 - 100.0 fL   MCH 27.9 26.0 - 34.0 pg   MCHC 33.1 30.0 - 36.0 g/dL   RDW 12.4 11.5 - 15.5 %   Platelets 257 150 - 400 K/uL   nRBC 0.0 0.0 - 0.2 %   Neutrophils Relative % 75 %   Neutro Abs 5.1 1.7 - 7.7 K/uL   Lymphocytes Relative 16 %   Lymphs Abs 1.1 0.7 - 4.0 K/uL   Monocytes Relative 8 %   Monocytes Absolute 0.5 0.1 - 1.0 K/uL   Eosinophils Relative 0 %   Eosinophils Absolute 0.0 0.0 - 0.5 K/uL   Basophils Relative 0 %   Basophils Absolute 0.0 0.0 - 0.1 K/uL   Immature Granulocytes 1 %   Abs Immature Granulocytes 0.03 0.00 - 0.07 K/uL    Comment: Performed at [facility], [address] [place], [place] [address]  Comprehensive metabolic panel     Status: Abnormal   Collection Time: 11/12/20 12:59 PM  Result Value Ref Range   Sodium 121 (L) 135 - 145 mmol/L   Potassium 5.6 (H) 3.5 - 5.1 mmol/L   Chloride 90 (L) 98 - 111 mmol/L   CO2 24  22 - 32 mmol/L   Glucose, Bld 103 (H) 70 - 99 mg/dL    Comment: Glucose reference range applies only to samples taken after fasting for at least 8 hours.   BUN 20 8 - 23 mg/dL   Creatinine, Ser 1.18 (H) 0.44 - 1.00 mg/dL   Calcium 9.5 8.9 - 10.3 mg/dL   Total Protein 6.7 6.5 - 8.1 g/dL   Albumin 3.4 (L) 3.5 - 5.0 g/dL   AST 21 15 - 41 U/L   ALT 12 0 - 44 U/L   Alkaline Phosphatase 59 38 - 126 U/L   Total Bilirubin 0.7 0.3 - 1.2 mg/dL   GFR, Estimated 46 (L) >60 mL/min    Comment: (NOTE) Calculated using the CKD-EPI Creatinine Equation (2021)    Anion gap 7 5 - 15    Comment: Performed at [facility], [address] [place], [place] [address]  Lipase, blood     Status: None   Collection Time: 11/12/20 12:59 PM  Result Value Ref Range   Lipase 20 11 - 51 U/L    Comment: Performed at  [facility], [place], [place] [address]  Troponin I (High Sensitivity)     Status: Abnormal   Collection Time: 11/12/20 12:59 PM  Result Value Ref Range   Troponin I (High Sensitivity) 42 (H) <18 ng/L    Comment: (NOTE) Elevated high sensitivity troponin I (hsTnI) values and significant  changes across serial measurements may suggest ACS but many other  chronic and acute conditions are known to elevate hsTnI results.  Refer to the "Links" section for chest pain algorithms and additional  guidance. Performed at [facility], [address] [place], [place] [address]   ESR     Status: Abnormal   Collection Time: 11/12/20 12:59 PM  Result Value Ref Range   Sed Rate 34 (H) 0 - 30 mm/hr    Comment: Performed at [facility], [address] [place], [place] [address]  CK     Status: None   Collection Time: 11/12/20 12:59 PM  Result Value Ref Range   Total CK 98 38 - 234 U/L    Comment: Performed at [facility], [address] [place], [place] [address]  Osmolality     Status: Abnormal   Collection Time: 11/12/20  1:00 PM  Result Value Ref Range   Osmolality 262 (L) 275 - 295 mOsm/kg    Comment: Performed at [facility], [address] [place], [place] [address]  Urinalysis, Complete w Microscopic Urine, Catheterized     Status: Abnormal   Collection Time: 11/12/20  1:24 PM  Result Value Ref Range   Color, Urine YELLOW YELLOW   APPearance HAZY (A) CLEAR   Specific Gravity, Urine 1.020 1.005 - 1.030   pH 5.0 5.0 - 8.0   Glucose, UA NEGATIVE NEGATIVE mg/dL   Hgb urine dipstick SMALL (A) NEGATIVE   Bilirubin Urine NEGATIVE NEGATIVE   Ketones, ur NEGATIVE NEGATIVE mg/dL   Protein, ur NEGATIVE NEGATIVE mg/dL   Nitrite NEGATIVE NEGATIVE   Leukocytes,Ua SMALL (A) NEGATIVE   RBC / HPF 11-20 0 - 5 RBC/hpf   WBC, UA 0-5 0 - 5 WBC/hpf   Bacteria, UA RARE (A) NONE SEEN   Squamous Epithelial / LPF >50 (H) 0 - 5    Mucus PRESENT     Comment: Performed at [facility], [address] [place], [place] [address]  Troponin I (High Sensitivity)  Status: Abnormal   Collection Time: 11/12/20  2:53 PM  Result Value Ref Range   Troponin I (High Sensitivity) 38 (H) <18 ng/L    Comment: (NOTE) Elevated high sensitivity troponin I (hsTnI) values and significant  changes across serial measurements may suggest ACS but many other  chronic and acute conditions are known to elevate hsTnI results.  Refer to the "Links" section for chest pain algorithms and additional  guidance. Performed at [facility], [address] [place], [place] [address]   Osmolality, urine     Status: None   Collection Time: 11/12/20  3:58 PM  Result Value Ref Range   Osmolality, Ur 484 300 - 900 mOsm/kg    Comment: Performed at [facility], [address] [place], [place] [address]  Sodium, urine, random     Status: None   Collection Time: 11/12/20  3:58 PM  Result Value Ref Range   Sodium, Ur 52 mmol/L    Comment: Performed at [facility], [address] [place], [place] [address]   CT Abdomen Pelvis Wo Contrast  Result Date: 11/12/2020 CLINICAL DATA:  Acute nonlocalized abdominal pain, history of diverticulosis EXAM: CT ABDOMEN AND PELVIS WITHOUT CONTRAST TECHNIQUE: Multidetector CT imaging of the abdomen and pelvis was performed following the standard protocol without IV contrast. Sagittal and coronal MPR images reconstructed from axial data set. No oral contrast. Small RIGHT pleural effusion. Trace LEFT pleural fluid. Bibasilar atelectasis greater on RIGHT. COMPARISON:  Gallbladder and liver normal appearance FINDINGS: Lower chest: Bibasilar atelectasis and minimal pleural effusions greater on RIGHT. Hepatobiliary: Gallbladder and liver normal appearance Pancreas: Atrophic pancreas Spleen: Unremarkable Adrenals/Urinary Tract: Adrenal glands normal appearance. Small LEFT renal cyst  unchanged 11 mm diameter. Kidneys and ureters normal appearance. Mild diffuse bladder wall thickening without mass, though bladder is underdistended. Stomach/Bowel: Normal appendix. Scattered stool throughout colon. Extensive diverticulosis of distal descending and sigmoid colon without definite evidence of diverticulitis. Questionable mild rectal wall thickening distally. Stomach and remaining bowel loops unremarkable. Vascular/Lymphatic: Extensive atherosclerotic calcifications aorta, iliac arteries, femoral arteries, visceral arteries. Aorta normal caliber. Pacemaker lead RIGHT ventricle. Enlargement of RIGHT atrium. No adenopathy. Reproductive: Uterus surgically absent. Unremarkable LEFT ovary. Cyst within RIGHT ovary 2.9 x 3.0 cm image 53; this is a simple appearing cyst and no follow-up imaging is recommended. Other: Umbilical hernia containing fat. No free air or free fluid. No acute inflammatory process. Musculoskeletal: Prior lumbar fusion L3-S1. Osseous demineralization with degenerative disc disease changes. IMPRESSION: Extensive distal colonic diverticulosis without evidence of diverticulitis. Questionable mild rectal wall thickening distally, recommend correlation with digital rectal exam and proctoscopy. Umbilical hernia containing fat. Bibasilar atelectasis and minimal pleural effusions greater on RIGHT. Aortic Atherosclerosis (ICD10-I70.0). Electronically Signed   By: Lavonia Dana M.D.   On: 11/12/2020 15:10    Pending Labs Unresulted Labs (From admission, onward)    Start     Ordered   11/12/20 1637  Rheumatoid factor  Once,   STAT        11/12/20 1636   11/12/20 1636  ANA w/Reflex  Once,   STAT        11/12/20 1636   11/12/20 1615  C-reactive protein  Once,   STAT        11/12/20 1615          Vitals/Pain Today's Vitals   11/12/20 1816 11/12/20 1845 11/12/20 1900 11/12/20 1915  BP: (!) 190/82 (!) 195/87 (!) 195/83 (!) 193/99  Pulse: 60 62 (!) 56 (!) 59  Resp: _0 Temp:      TempSrc:      SpO2: 100% 100% 99% 99%  Weight:      Height:      PainSc:        Isolation Precautions No active isolations  Medications Medications  patiromer (VELTASSA) packet 8.4 g (8.4 g Oral Given 11/12/20 1626)  ondansetron (ZOFRAN) injection 4 mg (4 mg Intravenous Given 11/12/20 1319)  lactated ringers bolus 1,000 mL (1,000 mLs Intravenous New Bag/Given 11/12/20 1625)  aspirin chewable tablet 325 mg (325 mg Oral Given 11/12/20 1708)    Mobility walks with person assist Moderate fall risk   Focused Assessments    R Recommendations: See Admitting Provider Note  Report given to:   Additional Notes:

## 2020-11-12 NOTE — ED Notes (Signed)
Pt had incontinence of urine; pt cleaned and new linen placed on pt

## 2020-11-12 NOTE — Progress Notes (Signed)
Pt moved to CPod  at 1700

## 2020-11-12 NOTE — ED Notes (Signed)
Informed RN bed assigned 

## 2020-11-12 NOTE — H&P (Signed)
[facility] History and Physical  Katrina Chen ZCH:[id number] DOB: 1935/05/20 DOA: 11/12/2020  Referring physician: Dr. Charna Archer PCP: Youlanda Roys, MD   Chief Complaint: abdominal pain  HPI: Katrina Chen is a 85 y.o. female with history of hypertension, spinal stenosis, CAD, bipolar, A. fib, CKD, failure to thrive, who presents with abdominal pain.  Patient was recently admitted from August 26 to the 29th after presenting with diffuse pain.  She was found to have significant constipation and a 6 cm fecal ball without evidence of obstruction.  Chronic opioids thought to be contributing to her constipation and she was discharged on a bowel regimen.  Patient is a somewhat inconsistent historian.  When asked what brings she reports that her whole body hurts including her chest.  She says that the pain is very similar to what brought her in for her last admission, she is unable to tell me how it is different from this last admission.  Reports she is eating and drinking normally that she would like something soft to eat.  She says that her chest pain is constant but that is much better than it was before she arrived.  In the ED initial vital signs notable only for hypertension.  Labs notable for marked hyponatremia down to 121, hyperkalemia 5.6, hypochloremia 90, creatinine at baseline.  CBC was unremarkable.  Troponin was mildly elevated at 42 on initial check.  UA with only leukocytes but appeared contaminated.  CT abdomen pelvis was obtained which showed extensive diverticulosis without diverticulitis, mild rectal wall thickening distally likely related to recent fecal impaction, a fat-containing umbilical hernia, and minimal pleural effusions in the lungs, overall no acute findings.  Review of Systems:  Pertinent positives and negative per HPI, all others reviewed and negative  Past Medical History:  Diagnosis Date   A-fib ([place])    Hyperlipidemia    Hypertension    Iron deficiency    Vascular  dementia ([place])    Past Surgical History:  Procedure Laterality Date   PACEMAKER IMPLANT     Social History:  reports that she has never smoked. She has never used smokeless tobacco. She reports that she does not drink alcohol and does not use drugs.  Allergies  Allergen Reactions   Iodine Swelling   Amlodipine Other (See Comments)   Nifedipine Swelling and Other (See Comments)   Lisinopril Swelling   Quinapril Swelling   Shellfish Allergy    Simvastatin Swelling    History reviewed. No pertinent family history.   Prior to Admission medications   Medication Sig Start Date End Date Taking? Authorizing Provider  bisacodyl (DULCOLAX) 10 MG suppository Place 1 suppository (10 mg total) rectally daily as needed for mild constipation. 11/08/20   Sidney Ace, MD  carvedilol (COREG) 6.25 MG tablet Take 12.5 mg by mouth 2 (two) times daily. 11/05/20   [provider]  celecoxib (CELEBREX) 100 MG capsule Take 100 mg by mouth 2 (two) times daily. 10/19/20   [provider]  cetirizine (ZYRTEC) 5 MG tablet Take 5 mg by mouth daily.    [provider]  cloNIDine (CATAPRES) 0.1 MG tablet Take 0.1 mg by mouth 2 (two) times daily. 11/04/20   [provider]  DULoxetine (CYMBALTA) 60 MG capsule Take 60 mg by mouth 2 (two) times daily. 10/26/20   [provider]  ELIQUIS 2.5 MG TABS tablet Take 2.5 mg by mouth 2 (two) times daily. 11/03/20   [provider]  gabapentin (NEURONTIN) 100 MG capsule  Take 100 mg by mouth daily. 11/03/20   [provider]  losartan (COZAAR) 100 MG tablet Take 100 mg by mouth daily. 10/18/20   [provider]  melatonin 5 MG TABS Take 10 mg by mouth at bedtime.    [provider]  OLANZapine (ZYPREXA) 2.5 MG tablet Take 2.5 mg by mouth at bedtime. 10/28/20   [provider]  olopatadine (PATANOL) 0.1 % ophthalmic solution Place 1 drop into both eyes 2 (two) times daily.    [provider]  polyethylene glycol (MIRALAX / GLYCOLAX) 17 g packet Take 17 g by mouth daily. 11/09/20   Sreenath, Sudheer B, MD  senna-docusate (SENOKOT-S) 8.6-50 MG tablet Take 1 tablet by mouth 2 (two) times daily. 11/08/20   Sidney Ace, MD  sodium phosphate (FLEET) 7-19 GM/118ML ENEM Place 133 mLs (1 enema total) rectally daily as needed for severe constipation. 11/08/20   Sreenath, Trula Slade, MD  traMADol (ULTRAM) 50 MG tablet Take 50 mg by mouth every 6 (six) hours as needed for pain. 09/23/20   [provider]   Physical Exam: Vitals:   11/12/20 1306 11/12/20 1307 11/12/20 1308  BP:  (!) 174/64   Pulse:  60   Resp:  16   Temp:  98.2 F (36.8 C)   TempSrc:  Oral   SpO2: 99% 99%   Weight:   76.7 kg  Height:   _0  (1.702 m)    Wt Readings from Last 3 Encounters:  11/12/20 76.7 kg  11/05/20 76.7 kg     General:  Appears calm and comfortable Eyes: PERRL, normal lids, irises & conjunctiva ENT: adentulous Neck: no masses Cardiovascular: RRR, no m/r/g. No LE edema.  Respiratory: CTA bilaterally, no w/r/r. Normal respiratory effort. Abdomen: soft, says she is tender throughout however no signs of tenderness or grimacing during exam Skin: no rash or induration seen on limited exam Musculoskeletal: grossly normal tone BUE/BLE Psychiatric:  speech somewhat difficult to understand and occasionally tangential Neurologic: grossly non-focal.  AOx4          Labs on Admission:  Basic Metabolic Panel: Recent Labs  Lab 11/06/20 0737 11/12/20 1259  NA  --  121*  K  --  5.6*  CL  --  90*  CO2  --  24  GLUCOSE  --  103*  BUN  --  20  CREATININE  --  1.18*  CALCIUM  --  9.5  MG 2.6*  --    Liver Function Tests: Recent Labs  Lab 11/12/20 1259  AST 21  ALT 12  ALKPHOS 59  BILITOT 0.7  PROT 6.7  ALBUMIN 3.4*   Recent Labs  Lab 11/12/20 1259  LIPASE 20   No results for input(s): AMMONIA in the last 168 hours. CBC: Recent Labs  Lab 11/12/20 1259   WBC 6.7  NEUTROABS 5.1  HGB 12.1  HCT 36.6  MCV 84.5  PLT 257   Cardiac Enzymes: No results for input(s): CKTOTAL, CKMB, CKMBINDEX, TROPONINI in the last 168 hours.  BNP (last 3 results) No results for input(s): BNP in the last 8760 hours.  ProBNP (last 3 results) No results for input(s): PROBNP in the last 8760 hours.  CBG: No results for input(s): GLUCAP in the last 168 hours.  Radiological Exams on Admission: CT Abdomen Pelvis Wo Contrast  Result Date: 11/12/2020 CLINICAL DATA:  Acute nonlocalized abdominal pain, history of diverticulosis EXAM: CT ABDOMEN AND PELVIS WITHOUT CONTRAST TECHNIQUE: Multidetector CT imaging of the  abdomen and pelvis was performed following the standard protocol without IV contrast. Sagittal and coronal MPR images reconstructed from axial data set. No oral contrast. Small RIGHT pleural effusion. Trace LEFT pleural fluid. Bibasilar atelectasis greater on RIGHT. COMPARISON:  Gallbladder and liver normal appearance FINDINGS: Lower chest: Bibasilar atelectasis and minimal pleural effusions greater on RIGHT. Hepatobiliary: Gallbladder and liver normal appearance Pancreas: Atrophic pancreas Spleen: Unremarkable Adrenals/Urinary Tract: Adrenal glands normal appearance. Small LEFT renal cyst unchanged 11 mm diameter. Kidneys and ureters normal appearance. Mild diffuse bladder wall thickening without mass, though bladder is underdistended. Stomach/Bowel: Normal appendix. Scattered stool throughout colon. Extensive diverticulosis of distal descending and sigmoid colon without definite evidence of diverticulitis. Questionable mild rectal wall thickening distally. Stomach and remaining bowel loops unremarkable. Vascular/Lymphatic: Extensive atherosclerotic calcifications aorta, iliac arteries, femoral arteries, visceral arteries. Aorta normal caliber. Pacemaker lead RIGHT ventricle. Enlargement of RIGHT atrium. No adenopathy. Reproductive: Uterus surgically absent.  Unremarkable LEFT ovary. Cyst within RIGHT ovary 2.9 x 3.0 cm image 53; this is a simple appearing cyst and no follow-up imaging is recommended. Other: Umbilical hernia containing fat. No free air or free fluid. No acute inflammatory process. Musculoskeletal: Prior lumbar fusion L3-S1. Osseous demineralization with degenerative disc disease changes. IMPRESSION: Extensive distal colonic diverticulosis without evidence of diverticulitis. Questionable mild rectal wall thickening distally, recommend correlation with digital rectal exam and proctoscopy. Umbilical hernia containing fat. Bibasilar atelectasis and minimal pleural effusions greater on RIGHT. Aortic Atherosclerosis (ICD10-I70.0). Electronically Signed   By: Lavonia Dana M.D.   On: 11/12/2020 15:10    EKG: Independently reviewed.  V paced rhythm, widened QRS.  No acute ischemic changes, compared to prior there are no significant changes.  Assessment/Plan Active Problems:   CAD (coronary artery disease)   History of bipolar disorder   Atrial fibrillation, chronic (HCC)   CKD (chronic kidney disease), stage III ([place])   Frail elderly   Essential hypertension   Spinal stenosis of lumbar region   Hyponatremia  Katrina Chen is a 85 y.o. female with history of hypertension, spinal stenosis, CAD, bipolar, A. fib, CKD, failure to thrive, who presents with diffuse pain and found to have hyponatremia and elevated troponins.  #Hyponatremia Urine awesome's well above serum osmolality in setting of hyponatremia consistent with SIADH.  Will fluid restrict and start salt tabs supplementation.  Etiology may be related to her medications. - Fluid restrict to 1500 mL daily - Start salt tabs 1 g 3 times daily - Hold duloxetine and olanzapine given association with SIADH, remainder of med list without any likely offenders  #Elevated troponin Mild troponin elevation to 42 on initial labs, repeat pending.  No acute ischemic change or other changes compared to  prior.  No clear inciting factor at this time besides hypertension, will trend troponins and EKG and do basic work-up. - TTE - Trend troponin - We will give full dose aspirin  #Diffuse body pain Now presenting for the second time with this complaint.  Differential includes: Autoimmune/rheumatic disease, fibromyalgia, secondary to hyponatremia although this is an uncommon presentation.  TSH checked during last admission was normal.  Does not seem to be neuropathic in origin.  Doubt adrenal insufficiency given hypertension. - Treat hyponatremia as above - Check ESR, CRP, CK, ANA, RF - Continue Celebrex  #Chronic medical problems CAD-continue carvedilol Allergies-continue cetirizine Hypertension-continue clonidine, losartan A. fib-continue Eliquis Neuropathy-continue gabapentin  Code Status: DNR/DNI, confirmed with patient DVT Prophylaxis: on full dose anticoagulation Family Communication: None Disposition Plan: Inpatient, Med-Surg   Time spent:  70 min  Clarnce Flock MD/MP[facility]  Note:  This document was prepared using Systems analyst and may include unintentional dictation errors.

## 2020-11-12 NOTE — ED Provider Notes (Signed)
[facility] Emergency Department Provider Note   ____________________________________________   Event Date/Time   First MD Initiated Contact with Patient 11/12/20 1250     (approximate)  I have reviewed the triage vital signs and the nursing notes.   HISTORY  Chief Complaint Abdominal Pain    HPI Katrina Chen is a 85 y.o. female with past medical history of hypertension, CAD, chronic atrial fibrillation on Eliquis, CKD, and bipolar disorder who presents to the ED for abdominal pain.  Patient states she "feels sick all over."  She reports intermittent pain in her abdomen but is unable to localize exactly where this pain is.  She has felt nauseous and per EMS and vomited a couple of times at [facility], denies diarrhea.  She denies any fevers, cough, chest pain, or shortness of breath.  She does endorse dysuria, but denies hematuria or flank pain.  EMS reports that labs were performed at [facility] and there was concern for low sodium, high potassium, and high troponin.        Past Medical History:  Diagnosis Date   A-fib [facility])    Hyperlipidemia    Hypertension    Iron deficiency    Vascular dementia [facility])     Patient Active Problem List   Diagnosis Date Noted   Chest pain 11/05/2020   CAD (coronary artery disease) 11/05/2020   Hyperkalemia 11/05/2020   Obstipation 11/05/2020   History of bipolar disorder 11/05/2020   Atrial fibrillation, chronic (HCC) 11/05/2020   CKD (chronic kidney disease), stage III (HCC) 11/05/2020   Frail elderly 11/05/2020   Weakness 11/05/2020   Protein-calorie malnutrition (HCC) 11/05/2020    Past Surgical History:  Procedure Laterality Date   PACEMAKER IMPLANT      Prior to Admission medications   Medication Sig Start Date End Date Taking? Authorizing Provider  bisacodyl (DULCOLAX) 10 MG suppository Place 1 suppository (10 mg total) rectally daily as needed for mild constipation. 11/08/20   Tresa Moore, MD  carvedilol (COREG) 6.25 MG tablet Take 12.5 mg by mouth 2 (two) times daily. 11/05/20   [provider]  celecoxib (CELEBREX) 100 MG capsule Take 100 mg by mouth 2 (two) times daily. 10/19/20   [provider]  cetirizine (ZYRTEC) 5 MG tablet Take 5 mg by mouth daily.    [provider]  cloNIDine (CATAPRES) 0.1 MG tablet Take 0.1 mg by mouth 2 (two) times daily. 11/04/20   [provider]  DULoxetine (CYMBALTA) 60 MG capsule Take 60 mg by mouth 2 (two) times daily. 10/26/20   [provider]  ELIQUIS 2.5 MG TABS tablet Take 2.5 mg by mouth 2 (two) times daily. 11/03/20   [provider]  gabapentin (NEURONTIN) 100 MG capsule Take 100 mg by mouth daily. 11/03/20   [provider]  losartan (COZAAR) 100 MG tablet Take 100 mg by mouth daily. 10/18/20   [provider]  melatonin 5 MG TABS Take 10 mg by mouth at bedtime.    [provider]  OLANZapine (ZYPREXA) 2.5 MG tablet Take 2.5 mg by mouth at bedtime. 10/28/20   [provider]  olopatadine (PATANOL) 0.1 % ophthalmic solution Place 1 drop into both eyes 2 (two) times daily.    [provider]  polyethylene glycol (MIRALAX / GLYCOLAX) 17 g packet Take 17 g by mouth daily. 11/09/20   Sreenath, Sudheer B, MD  senna-docusate (SENOKOT-S) 8.6-50 MG tablet Take 1 tablet by mouth 2 (two) times  daily. 11/08/20   Tresa Moore, MD  sodium phosphate (FLEET) 7-19 GM/118ML ENEM Place 133 mLs (1 enema total) rectally daily as needed for severe constipation. 11/08/20   Sreenath, Jonelle Sports, MD  traMADol (ULTRAM) 50 MG tablet Take 50 mg by mouth every 6 (six) hours as needed for pain. 09/23/20   [provider]    Allergies Iodine, Amlodipine, Nifedipine, Lisinopril, Quinapril, Shellfish allergy, and Simvastatin  History reviewed. No pertinent family history.  Social History Social History   Tobacco Use   Smoking status: Never   Smokeless  tobacco: Never  Substance Use Topics   Alcohol use: Never   Drug use: Never    Review of Systems  Constitutional: No fever/chills Eyes: No visual changes. ENT: No sore throat. Cardiovascular: Denies chest pain. Respiratory: Denies shortness of breath. Gastrointestinal: Positive for abdominal pain, nausea, and vomiting.  No diarrhea.  No constipation. Genitourinary: Positive for dysuria. Musculoskeletal: Negative for back pain. Skin: Negative for rash. Neurological: Negative for headaches, focal weakness or numbness.  ____________________________________________   PHYSICAL EXAM:  VITAL SIGNS: ED Triage Vitals  Enc Vitals Group     BP      Pulse      Resp      Temp      Temp src      SpO2      Weight      Height      Head Circumference      Peak Flow      Pain Score      Pain Loc      Pain Edu?      Excl. in GC?     Constitutional: Alert and oriented. Eyes: Conjunctivae are normal. Head: Atraumatic. Nose: No congestion/rhinnorhea. Mouth/Throat: Mucous membranes are moist. Neck: Normal ROM Cardiovascular: Normal rate, regular rhythm. Grossly normal heart sounds.  2+ radial pulses bilaterally Respiratory: Normal respiratory effort.  No retractions. Lungs CTAB. Gastrointestinal: Soft and diffusely tender to palpation with no rebound or guarding. No distention. Genitourinary: deferred Musculoskeletal: No lower extremity tenderness nor edema. Neurologic:  Normal speech and language. No gross focal neurologic deficits are appreciated. Skin:  Skin is warm, dry and intact. No rash noted. Psychiatric: Mood and affect are normal. Speech and behavior are normal.  ____________________________________________   LABS (all labs ordered are listed, but only abnormal results are displayed)  Labs Reviewed  COMPREHENSIVE METABOLIC PANEL - Abnormal; Notable for the following components:      Result Value   Sodium 121 (*)    Potassium 5.6 (*)    Chloride 90 (*)     Glucose, Bld 103 (*)    Creatinine, Ser 1.18 (*)    Albumin 3.4 (*)    GFR, Estimated 46 (*)    All other components within normal limits  URINALYSIS, COMPLETE (UACMP) WITH MICROSCOPIC - Abnormal; Notable for the following components:   APPearance HAZY (*)    Hgb urine dipstick SMALL (*)    Leukocytes,Ua SMALL (*)    Bacteria, UA RARE (*)    Squamous Epithelial / LPF >50 (*)    All other components within normal limits  TROPONIN I (HIGH SENSITIVITY) - Abnormal; Notable for the following components:   Troponin I (High Sensitivity) 42 (*)    All other components within normal limits  CBC WITH DIFFERENTIAL/PLATELET  LIPASE, BLOOD  TROPONIN I (HIGH SENSITIVITY)   ____________________________________________  EKG  ED ECG REPORT I, Chesley Noon, the attending physician, personally viewed and interpreted this ECG.  Date: 11/12/2020  EKG Time: 13:24  Rate: 60  Rhythm: Ventricular paced rhythm  Axis: LAD  Intervals:none  ST&T Change: None   PROCEDURES  Procedure(s) performed (including Critical Care):  Procedures   ____________________________________________   INITIAL IMPRESSION / ASSESSMENT AND PLAN / ED COURSE      85 year old female with past medical history of hypertension, CAD, atrial fibrillation on Eliquis, CKD, bipolar disorder who presents to the ED with a couple of days of intermittent abdominal pain associated with nausea, vomiting, and dysuria.  Patient is diffusely tender in to palpation on exam, we will further assess with labs, UA, and CT scan.  Given reported electrolyte abnormalities, it is possible that patient is dehydrated with AKI.  Patient also with reported elevated troponin, although she denies any symptoms that could represent ACS at this time.  We will check EKG and troponin here in the ED.  EKG shows ventricular paced rhythm with no obvious ischemic changes, troponin mildly elevated compared to previous and we will trend, although patient  denies any symptoms of ACS.  Additional labs are remarkable for hyponatremia with mild hyperkalemia.  I suspect this is due to dehydration and we will give IV fluid bolus as well as dose of V[place].  CT of abdomen/pelvis is negative for acute process, does show questionable thickening of the rectal wall, however rectal exam without significant tenderness.  This is likely due to patient's recent fecal impaction.  Plan to discuss with hospitalist for admission for further management of hyponatremia.      ____________________________________________   FINAL CLINICAL IMPRESSION(S) / ED DIAGNOSES  Final diagnoses:  Generalized abdominal pain  Non-intractable vomiting with nausea, unspecified vomiting type  Hyponatremia     ED Discharge Orders     None        Note:  This document was prepared using Dragon voice recognition software and may include unintentional dictation errors.    Chesley Noon, MD 11/12/20 1525

## 2020-11-13 ENCOUNTER — Inpatient Hospital Stay
Admit: 2020-11-13 | Discharge: 2020-11-13 | Disposition: A | Discharge status: Home / Self Care | Payer: Medicare Other | Attending: Family Medicine | Admitting: Family Medicine

## 2020-11-13 DIAGNOSIS — I482 Chronic atrial fibrillation, unspecified: Secondary | ICD-10-CM

## 2020-11-13 DIAGNOSIS — I10 Essential (primary) hypertension: Secondary | ICD-10-CM | POA: Diagnosis not present

## 2020-11-13 DIAGNOSIS — R9431 Abnormal electrocardiogram [ECG] [EKG]: Secondary | ICD-10-CM

## 2020-11-13 DIAGNOSIS — I4892 Unspecified atrial flutter: Secondary | ICD-10-CM

## 2020-11-13 DIAGNOSIS — Z8659 Personal history of other mental and behavioral disorders: Secondary | ICD-10-CM | POA: Diagnosis not present

## 2020-11-13 DIAGNOSIS — E871 Hypo-osmolality and hyponatremia: Secondary | ICD-10-CM

## 2020-11-13 LAB — BASIC METABOLIC PANEL
Anion gap: 7 (ref 5–15)
Anion gap: 8 (ref 5–15)
Anion gap: 4 — ABNORMAL LOW (ref 5–15)
Anion gap: 10 (ref 5–15)
BUN: 22 mg/dL (ref 8–23)
BUN: 23 mg/dL (ref 8–23)
BUN: 26 mg/dL — ABNORMAL HIGH (ref 8–23)
BUN: 26 mg/dL — ABNORMAL HIGH (ref 8–23)
CO2: 23 mmol/L (ref 22–32)
CO2: 25 mmol/L (ref 22–32)
CO2: 27 mmol/L (ref 22–32)
CO2: 21 mmol/L — ABNORMAL LOW (ref 22–32)
Calcium: 9.7 mg/dL (ref 8.9–10.3)
Calcium: 9.7 mg/dL (ref 8.9–10.3)
Calcium: 9.2 mg/dL (ref 8.9–10.3)
Calcium: 9.5 mg/dL (ref 8.9–10.3)
Chloride: 91 mmol/L — ABNORMAL LOW (ref 98–111)
Chloride: 93 mmol/L — ABNORMAL LOW (ref 98–111)
Chloride: 93 mmol/L — ABNORMAL LOW (ref 98–111)
Chloride: 94 mmol/L — ABNORMAL LOW (ref 98–111)
Creatinine, Ser: 1.19 mg/dL — ABNORMAL HIGH (ref 0.44–1.00)
Creatinine, Ser: 1.29 mg/dL — ABNORMAL HIGH (ref 0.44–1.00)
Creatinine, Ser: 1.37 mg/dL — ABNORMAL HIGH (ref 0.44–1.00)
Creatinine, Ser: 1.33 mg/dL — ABNORMAL HIGH (ref 0.44–1.00)
GFR, Estimated: 45 mL/min — ABNORMAL LOW (ref >60)
GFR, Estimated: 41 mL/min — ABNORMAL LOW (ref >60)
GFR, Estimated: 38 mL/min — ABNORMAL LOW (ref >60)
GFR, Estimated: 40 mL/min — ABNORMAL LOW (ref >60)
Glucose, Bld: 102 mg/dL — ABNORMAL HIGH (ref 70–99)
Glucose, Bld: 99 mg/dL (ref 70–99)
Glucose, Bld: 106 mg/dL — ABNORMAL HIGH (ref 70–99)
Glucose, Bld: 89 mg/dL (ref 70–99)
Potassium: 5.2 mmol/L — ABNORMAL HIGH (ref 3.5–5.1)
Potassium: 4.8 mmol/L (ref 3.5–5.1)
Potassium: 4.4 mmol/L (ref 3.5–5.1)
Potassium: 4.9 mmol/L (ref 3.5–5.1)
Sodium: 121 mmol/L — ABNORMAL LOW (ref 135–145)
Sodium: 126 mmol/L — ABNORMAL LOW (ref 135–145)
Sodium: 124 mmol/L — ABNORMAL LOW (ref 135–145)
Sodium: 125 mmol/L — ABNORMAL LOW (ref 135–145)

## 2020-11-13 LAB — CBC
HCT: 34.1 % — ABNORMAL LOW (ref 36.0–46.0)
Hemoglobin: 11.5 g/dL — ABNORMAL LOW (ref 12.0–15.0)
MCH: 28.5 pg (ref 26.0–34.0)
MCHC: 33.7 g/dL (ref 30.0–36.0)
MCV: 84.6 fL (ref 80.0–100.0)
Platelets: 217 10*3/uL (ref 150–400)
RBC: 4.03 MIL/uL (ref 3.87–5.11)
RDW: 12.3 % (ref 11.5–15.5)
WBC: 6.9 10*3/uL (ref 4.0–10.5)
nRBC: 0 % (ref 0.0–0.2)

## 2020-11-13 LAB — TROPONIN I (HIGH SENSITIVITY): Troponin I (High Sensitivity): 32 ng/L — ABNORMAL HIGH (ref <18)

## 2020-11-13 LAB — C-REACTIVE PROTEIN: CRP: 0.7 mg/dL (ref <1.0)

## 2020-11-13 LAB — MRSA NEXT GEN BY PCR, NASAL: MRSA by PCR Next Gen: NOT DETECTED

## 2020-11-13 MED ORDER — SENNOSIDES-DOCUSATE SODIUM 8.6-50 MG PO TABS
2.0000 | ORAL_TABLET | Freq: Two times a day (BID) | ORAL | Status: DC
  Administered 2020-11-13 – 2020-11-14 (×3): 2 via ORAL
  Filled 2020-11-13 (×3): qty 2

## 2020-11-13 MED ORDER — CARVEDILOL 6.25 MG PO TABS
12.5000 mg | ORAL_TABLET | Freq: Two times a day (BID) | ORAL | Status: DC
  Administered 2020-11-13 – 2020-11-14 (×2): 12.5 mg via ORAL
  Filled 2020-11-13 (×2): qty 2

## 2020-11-13 MED ORDER — HYDRALAZINE HCL 50 MG PO TABS: 25.0000 mg | ORAL_TABLET | Freq: Three times a day (TID) | ORAL | Status: DC

## 2020-11-13 MED ORDER — POLYETHYLENE GLYCOL 3350 17 G PO PACK
17.0000 g | PACK | Freq: Two times a day (BID) | ORAL | Status: DC
  Administered 2020-11-13 – 2020-11-17 (×9): 17 g via ORAL
  Filled 2020-11-13 (×9): qty 1

## 2020-11-13 MED ORDER — CARVEDILOL 25 MG PO TABS: 25.0000 mg | ORAL_TABLET | Freq: Two times a day (BID) | ORAL | Status: DC

## 2020-11-13 MED ORDER — BIOTENE DRY MOUTH MT LIQD: 15.0000 mL | OROMUCOSAL | Status: DC | PRN

## 2020-11-13 MED ORDER — CLONIDINE HCL 0.1 MG PO TABS
0.1000 mg | ORAL_TABLET | Freq: Every day | ORAL | Status: DC
  Administered 2020-11-14 – 2020-11-16 (×3): 0.1 mg via ORAL
  Filled 2020-11-13 (×3): qty 1

## 2020-11-13 MED ORDER — PERFLUTREN LIPID MICROSPHERE
1.0000 mL | INTRAVENOUS | Status: AC | PRN
  Administered 2020-11-13: 2.5 mL via INTRAVENOUS
  Filled 2020-11-13: qty 10

## 2020-11-13 NOTE — Progress Notes (Signed)
SLP Cancellation Note  Patient Details Name: Clarissa Laird MRN: [id number] DOB: June 03, 1935   Cancelled treatment:       Reason Eval/Treat Not Completed: SLP screened, no needs identified, will sign off (chart reviewed; consulted NSG then met w/ pt/Dtr in room). Pt alert, awake, and verbally conversive w/ this SLP and Dtr. Pt and Dtr denied pt having any difficulty swallowing at home and at this admit. She is currently on a Regular diet and would like the foods cut and soft d/t edentulous status(baseline). She tolerates swallowing pills w/ water per NSG. Pt conversed in basic conversation w/out any gross expressive/receptive deficits noted. Speech clear. No further skilled ST services indicated as pt appears at her baseline. Will adjust the diet consistency a little to more of a mech soft per pt's request. Pt/Dtr agreed. NSG to reconsult if any change in status while admitted.       Orinda Kenner, MS, CCC-SLP Speech Language Pathologist Rehab Services [phone] [facility] 11/13/2020, 11:47 AM

## 2020-11-13 NOTE — Progress Notes (Addendum)
PROGRESS NOTE    Katrina Chen  GXQ:[id number] DOB: [birth year] DOA: 11/12/2020 PCP: Shawn Stall, MD    Chief Complaint  Patient presents with   Abdominal Pain    Pt sent for abnormal labs and abdominal pain with n/v. Pt also reports burning on urination. Given 4mg  zofran PTA and in NAD on arrival.    Brief Narrative:   Katrina Chen is a 85 y.o. female with history of hypertension, spinal stenosis, CAD, bipolar, A. fib, CKD, failure to thrive, who presents with abdominal pain. Recent treated for constipation, found to have hyponatremia, likely SIADH  Subjective:  Frail elderly, lethargic but AAOx3,  Daughter at bedside  Assessment & Plan:   Active Problems:   CAD (coronary artery disease)   History of bipolar disorder   Atrial fibrillation, chronic (HCC)   CKD (chronic kidney disease), stage III (HCC)   Frail elderly   Essential hypertension   Spinal stenosis of lumbar region   Hyponatremia   Hyponatremia Urine osmolarity 484, urine sodium 52 consistent with SIADH Fluids restriction, salt tabs Hold Cymbalta and Zyprexa  Hyperkalemia DC losartan Continue patiromer  Lethargy Decrease clonidine, check urine culture  Hypertension Continue Coreg,  Clonidine recently started , daughter reports patient is more drowsy , change clonidine from bid to qhs DC losartan due to hyperkalemia,  start hydralazine  History of PAF, bradycardia with pauses status post pacemaker On Coreg Eliquis   Constipation Was admitted to the hospital few days ago with a 6 cm fecal ball Start Senokot and MiraLAX  Nutritional Assessment:  The patient's BMI is: Body mass index is 29.9 kg/m.91  Seen by dietician.  I agree with the assessment and plan as outlined below:     Unresulted Labs (From admission, onward)     Start     Ordered   11/13/20 0719  MRSA Next Gen by PCR, Nasal  Once,   R        11/13/20 0719   11/13/20 0245  C-reactive protein  Once,   STAT        11/13/20 0245    11/12/20 2045  Basic metabolic panel  Now then every 6 hours,   STAT (with TIMED occurrences)      11/12/20 2044   11/12/20 1637  Rheumatoid factor  Once,   STAT        11/12/20 1636   11/12/20 1636  ANA w/Reflex  Once,   STAT        11/12/20 1636              DVT prophylaxis: apixaban (ELIQUIS) tablet 2.5 mg Start: 11/12/20 2200 apixaban (ELIQUIS) tablet 2.5 mg   Code Status:DNR Family Communication: daughter  Disposition:   Status is: Inpatient  Dispo: The patient is from: SNF              Anticipated d/c is to: SNF              Anticipated d/c date is: TBD                Consultants:  none  Procedures:  none  Antimicrobials:   Anti-infectives (From admission, onward)    None           Objective: Vitals:   11/12/20 2139 11/13/20 0005 11/13/20 0632 11/13/20 0813  BP: (!) 188/81 (!) 162/65 (!) 143/71 (!) 172/72  Pulse: (!) 59  (!) 59 61  Resp: 19 17 16 17   Temp: 98.7 F (37.1 C) 97.7  F (36.5 C) 97.8 F (36.6 C) 97.7 F (36.5 C)  TempSrc:  Oral    SpO2: 99% 100% 100% 100%  Weight: 86.6 kg     Height: 5\' 7"  (1.702 m)       Intake/Output Summary (Last 24 hours) at 11/13/2020 0901 Last data filed at 11/13/2020 0459 Gross per 24 hour  Intake 118 ml  Output 500 ml  Net -382 ml   Filed Weights   11/12/20 1308 11/12/20 2139  Weight: 76.7 kg 86.6 kg    Examination:  General exam: frail, calm, lethargic but aaox3 Respiratory system: Clear to auscultation. Respiratory effort normal. Cardiovascular system: S1 & S2 heard, RRR. No JVD, no murmur, No pedal edema. Gastrointestinal system: Abdomen is nondistended, soft and nontender.  Normal bowel sounds heard. Central nervous system: Alert and oriented. No focal neurological deficits. Extremities: no edema Skin: No rashes, lesions or ulcers Psychiatry: Judgement and insight appear normal. Mood & affect appropriate.     Data Reviewed: I have personally reviewed following labs and imaging  studies  CBC: Recent Labs  Lab 11/12/20 1259 11/13/20 0226  WBC 6.7 6.9  NEUTROABS 5.1  --   HGB 12.1 11.5*  HCT 36.6 34.1*  MCV 84.5 84.6  PLT 257 217    Basic Metabolic Panel: Recent Labs  Lab 11/12/20 1259 11/12/20 2138 11/13/20 0226  NA 121* 123* 121*  K 5.6* 5.2* 5.2*  CL 90* 91* 91*  CO2 24 26 23   GLUCOSE 103* 98 102*  BUN 20 20 22   CREATININE 1.18* 1.20* 1.19*  CALCIUM 9.5 9.9 9.7    GFR: Estimated Creatinine Clearance: 40.5 mL/min (A) (by C-G formula based on SCr of 1.19 mg/dL (H)).  Liver Function Tests: Recent Labs  Lab 11/12/20 1259  AST 21  ALT 12  ALKPHOS 59  BILITOT 0.7  PROT 6.7  ALBUMIN 3.4*    CBG: No results for input(s): GLUCAP in the last 168 hours.   Recent Results (from the past 240 hour(s))  Resp Panel by RT-PCR (Flu A&B, Covid) Nasopharyngeal Swab     Status: None   Collection Time: 11/05/20  3:35 PM   Specimen: Nasopharyngeal Swab; Nasopharyngeal(NP) swabs in vial transport medium  Result Value Ref Range Status   SARS Coronavirus 2 by RT PCR NEGATIVE NEGATIVE Final    Comment: (NOTE) SARS-CoV-2 target nucleic acids are NOT DETECTED.  The SARS-CoV-2 RNA is generally detectable in upper respiratory specimens during the acute phase of infection. The lowest concentration of SARS-CoV-2 viral copies this assay can detect is 138 copies/mL. A negative result does not preclude SARS-Cov-2 infection and should not be used as the sole basis for treatment or other patient management decisions. A negative result may occur with  improper specimen collection/handling, submission of specimen other than nasopharyngeal swab, presence of viral mutation(s) within the areas targeted by this assay, and inadequate number of viral copies(<138 copies/mL). A negative result must be combined with clinical observations, patient history, and epidemiological information. The expected result is Negative.  Fact Sheet for Patients:   BloggerCourse.comhttps://www.fda.gov/media/152166/download  Fact Sheet for Healthcare Providers:  SeriousBroker.ithttps://www.fda.gov/media/152162/download  This test is no t yet approved or cleared by the [place] FDA and  has been authorized for detection and/or diagnosis of SARS-CoV-2 by FDA under an Emergency Use Authorization (EUA). This EUA will remain  in effect (meaning this test can be used) for the duration of the COVID-19 declaration under Section 564(b)(1) of the Act, 21 U.S.C.section 360bbb-3(b)(1), unless the authorization is terminated  or revoked sooner.       Influenza A by PCR NEGATIVE NEGATIVE Final   Influenza B by PCR NEGATIVE NEGATIVE Final    Comment: (NOTE) The Xpert Xpress SARS-CoV-2/FLU/RSV plus assay is intended as an aid in the diagnosis of influenza from Nasopharyngeal swab specimens and should not be used as a sole basis for treatment. Nasal washings and aspirates are unacceptable for Xpert Xpress SARS-CoV-2/FLU/RSV testing.  Fact Sheet for Patients: BloggerCourse.com  Fact Sheet for Healthcare Providers: SeriousBroker.it  This test is not yet approved or cleared by the [place] FDA and has been authorized for detection and/or diagnosis of SARS-CoV-2 by FDA under an Emergency Use Authorization (EUA). This EUA will remain in effect (meaning this test can be used) for the duration of the COVID-19 declaration under Section 564(b)(1) of the Act, 21 U.S.C. section 360bbb-3(b)(1), unless the authorization is terminated or revoked.  Performed at [facility], [address], [place], [place] [address]          Radiology Studies: CT Abdomen Pelvis Wo Contrast  Result Date: 11/12/2020 CLINICAL DATA:  Acute nonlocalized abdominal pain, history of diverticulosis EXAM: CT ABDOMEN AND PELVIS WITHOUT CONTRAST TECHNIQUE: Multidetector CT imaging of the abdomen and pelvis was performed following the standard protocol  without IV contrast. Sagittal and coronal MPR images reconstructed from axial data set. No oral contrast. Small RIGHT pleural effusion. Trace LEFT pleural fluid. Bibasilar atelectasis greater on RIGHT. COMPARISON:  Gallbladder and liver normal appearance FINDINGS: Lower chest: Bibasilar atelectasis and minimal pleural effusions greater on RIGHT. Hepatobiliary: Gallbladder and liver normal appearance Pancreas: Atrophic pancreas Spleen: Unremarkable Adrenals/Urinary Tract: Adrenal glands normal appearance. Small LEFT renal cyst unchanged 11 mm diameter. Kidneys and ureters normal appearance. Mild diffuse bladder wall thickening without mass, though bladder is underdistended. Stomach/Bowel: Normal appendix. Scattered stool throughout colon. Extensive diverticulosis of distal descending and sigmoid colon without definite evidence of diverticulitis. Questionable mild rectal wall thickening distally. Stomach and remaining bowel loops unremarkable. Vascular/Lymphatic: Extensive atherosclerotic calcifications aorta, iliac arteries, femoral arteries, visceral arteries. Aorta normal caliber. Pacemaker lead RIGHT ventricle. Enlargement of RIGHT atrium. No adenopathy. Reproductive: Uterus surgically absent. Unremarkable LEFT ovary. Cyst within RIGHT ovary 2.9 x 3.0 cm image 53; this is a simple appearing cyst and no follow-up imaging is recommended. Other: Umbilical hernia containing fat. No free air or free fluid. No acute inflammatory process. Musculoskeletal: Prior lumbar fusion L3-S1. Osseous demineralization with degenerative disc disease changes. IMPRESSION: Extensive distal colonic diverticulosis without evidence of diverticulitis. Questionable mild rectal wall thickening distally, recommend correlation with digital rectal exam and proctoscopy. Umbilical hernia containing fat. Bibasilar atelectasis and minimal pleural effusions greater on RIGHT. Aortic Atherosclerosis (ICD10-I70.0). Electronically Signed   By: Ulyses Southward M.D.   On: 11/12/2020 15:10        Scheduled Meds:  apixaban  2.5 mg Oral BID   carvedilol  12.5 mg Oral BID   celecoxib  100 mg Oral BID   cloNIDine  0.1 mg Oral BID   gabapentin  100 mg Oral Daily   hydrALAZINE  25 mg Oral Q8H   loratadine  10 mg Oral Daily   melatonin  10 mg Oral QHS   olopatadine  1 drop Both Eyes BID   patiromer  8.4 g Oral Daily   polyethylene glycol  17 g Oral BID   senna-docusate  2 tablet Oral BID   sodium chloride flush  3 mL Intravenous Q12H   sodium chloride  1 g Oral TID WC  Continuous Infusions:   LOS: 1 day   Time spent: Greater than 50% of this time was spent in counseling, explanation of diagnosis, planning of further management, and coordination of care.   Voice Recognition Reubin Milan dictation system was used to create this note, attempts have been made to correct errors. Please contact the author with questions and/or clarifications.   Albertine Grates, MD PhD FACP [facility]  Available via Epic secure chat 7am-7pm for nonurgent issues Please page for urgent issues To page the attending provider between 7A-7P or the covering provider during after hours 7P-7A, please log into the web site [email or web address] and access using universal [place] password for that web site. If you do not have the password, please call the hospital operator.    11/13/2020, 9:01 AM

## 2020-11-13 NOTE — Progress Notes (Signed)
At 1200 BP 102/42.  Currently BP 107/52, MAP 68.  Hydralazine due. Dr. Roda Shutters has been notified.  MD d/c hydralazine.

## 2020-11-13 NOTE — Progress Notes (Signed)
*  PRELIMINARY RESULTS* Echocardiogram 2D Echocardiogram has been performed. Definity IV Contrast used on this study.  Lenor Coffin 11/13/2020, 11:36 AM

## 2020-11-14 DIAGNOSIS — E871 Hypo-osmolality and hyponatremia: Secondary | ICD-10-CM | POA: Diagnosis not present

## 2020-11-14 DIAGNOSIS — I482 Chronic atrial fibrillation, unspecified: Secondary | ICD-10-CM | POA: Diagnosis not present

## 2020-11-14 DIAGNOSIS — Z8659 Personal history of other mental and behavioral disorders: Secondary | ICD-10-CM | POA: Diagnosis not present

## 2020-11-14 DIAGNOSIS — I10 Essential (primary) hypertension: Secondary | ICD-10-CM | POA: Diagnosis not present

## 2020-11-14 LAB — BASIC METABOLIC PANEL
Anion gap: 5 (ref 5–15)
BUN: 26 mg/dL — ABNORMAL HIGH (ref 8–23)
CO2: 27 mmol/L (ref 22–32)
Calcium: 9.8 mg/dL (ref 8.9–10.3)
Chloride: 95 mmol/L — ABNORMAL LOW (ref 98–111)
Creatinine, Ser: 1.38 mg/dL — ABNORMAL HIGH (ref 0.44–1.00)
GFR, Estimated: 38 mL/min — ABNORMAL LOW (ref >60)
Glucose, Bld: 88 mg/dL (ref 70–99)
Potassium: 5.3 mmol/L — ABNORMAL HIGH (ref 3.5–5.1)
Sodium: 127 mmol/L — ABNORMAL LOW (ref 135–145)

## 2020-11-14 LAB — ECHOCARDIOGRAM COMPLETE
Height: 67 in
S' Lateral: 2.35 cm
Weight: 3054.69 oz

## 2020-11-14 MED ORDER — SENNOSIDES-DOCUSATE SODIUM 8.6-50 MG PO TABS
1.0000 | ORAL_TABLET | Freq: Two times a day (BID) | ORAL | Status: DC
  Administered 2020-11-14 – 2020-11-17 (×4): 1 via ORAL
  Filled 2020-11-14 (×4): qty 1

## 2020-11-14 MED ORDER — CARVEDILOL 25 MG PO TABS
25.0000 mg | ORAL_TABLET | Freq: Two times a day (BID) | ORAL | Status: DC
  Administered 2020-11-14 – 2020-11-17 (×6): 25 mg via ORAL
  Filled 2020-11-14 (×6): qty 1

## 2020-11-14 NOTE — Progress Notes (Addendum)
PROGRESS NOTE    Katrina Chen  MHD:[id number] DOB: 06-22-1935 DOA: 11/12/2020 PCP: Shawn Stall, MD    Chief Complaint  Patient presents with   Abdominal Pain    Pt sent for abnormal labs and abdominal pain with n/v. Pt also reports burning on urination. Given 4mg  zofran PTA and in NAD on arrival.    Brief Narrative:   Katrina Chen is a 85 y.o. female with history of hypertension, spinal stenosis, CAD, bipolar, A. fib, CKD, failure to thrive, who presents with abdominal pain. Recent treated for constipation, found to have hyponatremia, likely SIADH  Subjective:  Frail elderly, appear more alert, pleasant and interactive,  AAOx3,  Urine appears cloudy Currently denies pain, no fever Daughter at bedside  Assessment & Plan:   Active Problems:   CAD (coronary artery disease)   History of bipolar disorder   Atrial fibrillation, chronic (HCC)   CKD (chronic kidney disease), stage III (HCC)   Frail elderly   Essential hypertension   Spinal stenosis of lumbar region   Hyponatremia   Hyponatremia Urine osmolarity 484, urine sodium 52 consistent with SIADH Fluids restriction, salt tabs Hold Cymbalta and Zyprexa Slowly improving  Lethargy, present on admission Could be due to hyponatremia Decrease clonidine, check urine culture  Hyperkalemia DC losartan Continue patiromer Change to low potassium renal diet Repeat BMP in the morning   Hypertension Bp elevated, increase Coreg,  Clonidine recently started , daughter reports patient is more drowsy , change clonidine from bid to qhs DC losartan due to hyperkalemia,  Consider  hydralazine/imdur if bp not improving on increase coreg  History of PAF, bradycardia with pauses status post pacemaker On Coreg Eliquis  High-sensitivity troponin obtained from the ED mild and flat, not consistent with ACS She currently denies chest pain   Constipation Was admitted to the hospital few days ago with a 6 cm fecal ball Start Senokot  and MiraLAX Has bm today  FTT, baseline wheel chair bound, from SNF  Nutritional Assessment:  The patient's BMI is: Body mass index is 29.9 kg/m.91  Seen by dietician.  I agree with the assessment and plan as outlined below:     Unresulted Labs (From admission, onward)     Start     Ordered   11/15/20 0500  Basic metabolic panel  Tomorrow morning,   R       Question:  Specimen collection method  Answer:  Lab=Lab collect   11/14/20 0402   11/14/20 1135  Urine Culture  Once,   R       Question:  Indication  Answer:  Altered mental status (if no other cause identified)   11/14/20 1135   11/12/20 1637  Rheumatoid factor  Once,   STAT        11/12/20 1636   11/12/20 1636  ANA w/Reflex  Once,   STAT        11/12/20 1636              DVT prophylaxis: apixaban (ELIQUIS) tablet 2.5 mg Start: 11/12/20 2200 apixaban (ELIQUIS) tablet 2.5 mg   Code Status:DNR Family Communication: daughter  Disposition:   Status is: Inpatient  Dispo: The patient is from: SNF              Anticipated d/c is to: SNF              Anticipated d/c date is: 48-72hrs pending on na/k level and urine culture result  Consultants:  none  Procedures:  none  Antimicrobials:   Anti-infectives (From admission, onward)    None           Objective: Vitals:   11/14/20 0401 11/14/20 0740 11/14/20 1150 11/14/20 1500  BP: (!) 156/68 (!) 153/59 (!) 164/54 (!) 162/61  Pulse: (!) 59 60 (!) 59 61  Resp: 18 16 16 16   Temp: 98 F (36.7 C) 98.2 F (36.8 C) 98.4 F (36.9 C) 98.2 F (36.8 C)  TempSrc:    Oral  SpO2: 100% 100% 100% 100%  Weight:      Height:        Intake/Output Summary (Last 24 hours) at 11/14/2020 1623 Last data filed at 11/14/2020 1603 Gross per 24 hour  Intake 600 ml  Output 4100 ml  Net -3500 ml   Filed Weights   11/12/20 1308 11/12/20 2139  Weight: 76.7 kg 86.6 kg    Examination:  General exam: frail, calm, more alert, pleasant and interactive  ,aaox3 Respiratory system: Clear to auscultation. Respiratory effort normal. Cardiovascular system: S1 & S2 heard, RRR. No JVD, no murmur, No pedal edema. Gastrointestinal system: Abdomen is nondistended, soft and nontender.  Normal bowel sounds heard. Central nervous system: Alert and oriented x3. No focal neurological deficits. Extremities: no edema Skin: No rashes, lesions or ulcers Psychiatry: Judgement and insight appear normal. Mood & affect appropriate.     Data Reviewed: I have personally reviewed following labs and imaging studies  CBC: Recent Labs  Lab 11/12/20 1259 11/13/20 0226  WBC 6.7 6.9  NEUTROABS 5.1  --   HGB 12.1 11.5*  HCT 36.6 34.1*  MCV 84.5 84.6  PLT 257 217    Basic Metabolic Panel: Recent Labs  Lab 11/13/20 0226 11/13/20 0954 11/13/20 1448 11/13/20 2041 11/14/20 0248  NA 121* 126* 124* 125* 127*  K 5.2* 4.8 4.4 4.9 5.3*  CL 91* 93* 93* 94* 95*  CO2 23 25 27  21* 27  GLUCOSE 102* 99 106* 89 88  BUN 22 23 26* 26* 26*  CREATININE 1.19* 1.29* 1.37* 1.33* 1.38*  CALCIUM 9.7 9.7 9.2 9.5 9.8    GFR: Estimated Creatinine Clearance: 34.9 mL/min (A) (by C-G formula based on SCr of 1.38 mg/dL (H)).  Liver Function Tests: Recent Labs  Lab 11/12/20 1259  AST 21  ALT 12  ALKPHOS 59  BILITOT 0.7  PROT 6.7  ALBUMIN 3.4*    CBG: No results for input(s): GLUCAP in the last 168 hours.   Recent Results (from the past 240 hour(s))  Resp Panel by RT-PCR (Flu A&B, Covid) Nasopharyngeal Swab     Status: None   Collection Time: 11/05/20  3:35 PM   Specimen: Nasopharyngeal Swab; Nasopharyngeal(NP) swabs in vial transport medium  Result Value Ref Range Status   SARS Coronavirus 2 by RT PCR NEGATIVE NEGATIVE Final    Comment: (NOTE) SARS-CoV-2 target nucleic acids are NOT DETECTED.  The SARS-CoV-2 RNA is generally detectable in upper respiratory specimens during the acute phase of infection. The lowest concentration of SARS-CoV-2 viral copies this  assay can detect is 138 copies/mL. A negative result does not preclude SARS-Cov-2 infection and should not be used as the sole basis for treatment or other patient management decisions. A negative result may occur with  improper specimen collection/handling, submission of specimen other than nasopharyngeal swab, presence of viral mutation(s) within the areas targeted by this assay, and inadequate number of viral copies(<138 copies/mL). A negative result must be combined with clinical observations, patient  history, and epidemiological information. The expected result is Negative.  Fact Sheet for Patients:  BloggerCourse.com  Fact Sheet for Healthcare Providers:  SeriousBroker.it  This test is no t yet approved or cleared by the [place] FDA and  has been authorized for detection and/or diagnosis of SARS-CoV-2 by FDA under an Emergency Use Authorization (EUA). This EUA will remain  in effect (meaning this test can be used) for the duration of the COVID-19 declaration under Section 564(b)(1) of the Act, 21 U.S.C.section 360bbb-3(b)(1), unless the authorization is terminated  or revoked sooner.       Influenza A by PCR NEGATIVE NEGATIVE Final   Influenza B by PCR NEGATIVE NEGATIVE Final    Comment: (NOTE) The Xpert Xpress SARS-CoV-2/FLU/RSV plus assay is intended as an aid in the diagnosis of influenza from Nasopharyngeal swab specimens and should not be used as a sole basis for treatment. Nasal washings and aspirates are unacceptable for Xpert Xpress SARS-CoV-2/FLU/RSV testing.  Fact Sheet for Patients: BloggerCourse.com  Fact Sheet for Healthcare Providers: SeriousBroker.it  This test is not yet approved or cleared by the [place] FDA and has been authorized for detection and/or diagnosis of SARS-CoV-2 by FDA under an Emergency Use Authorization (EUA). This EUA will  remain in effect (meaning this test can be used) for the duration of the COVID-19 declaration under Section 564(b)(1) of the Act, 21 U.S.C. section 360bbb-3(b)(1), unless the authorization is terminated or revoked.  Performed at [facility], [address] [place], [place] [address]   MRSA Next Gen by PCR, Nasal     Status: None   Collection Time: 11/13/20  1:20 PM   Specimen: Nasal Mucosa; Nasal Swab  Result Value Ref Range Status   MRSA by PCR Next Gen NOT DETECTED NOT DETECTED Final    Comment: (NOTE) The GeneXpert MRSA Assay (FDA approved for NASAL specimens only), is one component of a comprehensive MRSA colonization surveillance program. It is not intended to diagnose MRSA infection nor to guide or monitor treatment for MRSA infections. Test performance is not FDA approved in patients less than 49 years old. Performed at [facility], [address] [place], [place] [address]          Radiology Studies: ECHOCARDIOGRAM COMPLETE  Result Date: 11/14/2020    ECHOCARDIOGRAM REPORT   Patient Name:   BRYONNA SUNDBY Date of Exam: 11/13/2020 Medical Rec #:  [id number]  Height:       67.0 in Accession #:    [id number] Weight:       190.9 lb Date of Birth:  09/30/1935  BSA:          1.983 m Patient Age:    85 years   BP:           143/71 mmHg Patient Gender: F          HR:           60 bpm. Exam Location:  [facility] Procedure: 2D Echo and Intracardiac Opacification Agent Indications:     Elevated Troponin  History:         Patient has no prior history of Echocardiogram examinations.  Sonographer:     Overton Mam RDCS Referring Phys:  [id number] MATTHEW M ECKSTAT Diagnosing Phys: Adrian Blackwater  Sonographer Comments: Technically difficult study due to poor echo windows, suboptimal parasternal window, suboptimal subcostal window and suboptimal apical window. IMPRESSIONS  1. Left ventricular ejection fraction, by estimation, is 60 to 65%. The left ventricle has normal function. The  left ventricle has no regional wall motion abnormalities. Left ventricular diastolic function could not be evaluated.  2. Right ventricular systolic function is normal. The right ventricular size is normal.  3. The mitral valve is normal in structure. No evidence of mitral valve regurgitation. No evidence of mitral stenosis.  4. The aortic valve is normal in structure. Aortic valve regurgitation is not visualized. No aortic stenosis is present.  5. The inferior vena cava is normal in size with greater than 50% respiratory variability, suggesting right atrial pressure of 3 mmHg. FINDINGS  Left Ventricle: Left ventricular ejection fraction, by estimation, is 60 to 65%. The left ventricle has normal function. The left ventricle has no regional wall motion abnormalities. Definity contrast agent was given IV to delineate the left ventricular  endocardial borders. The left ventricular internal cavity size was normal in size. There is no left ventricular hypertrophy. Left ventricular diastolic function could not be evaluated. Right Ventricle: The right ventricular size is normal. No increase in right ventricular wall thickness. Right ventricular systolic function is normal. Left Atrium: Left atrial size was normal in size. Right Atrium: Right atrial size was normal in size. Pericardium: There is no evidence of pericardial effusion. Mitral Valve: The mitral valve is normal in structure. No evidence of mitral valve regurgitation. No evidence of mitral valve stenosis. Tricuspid Valve: The tricuspid valve is normal in structure. Tricuspid valve regurgitation is not demonstrated. No evidence of tricuspid stenosis. Aortic Valve: The aortic valve is normal in structure. Aortic valve regurgitation is not visualized. No aortic stenosis is present. Pulmonic Valve: The pulmonic valve was normal in structure. Pulmonic valve regurgitation is not visualized. No evidence of pulmonic stenosis. Aorta: The aortic root is normal in size and  structure. Venous: The inferior vena cava is normal in size with greater than 50% respiratory variability, suggesting right atrial pressure of 3 mmHg. IAS/Shunts: No atrial level shunt detected by color flow Doppler.  LEFT VENTRICLE PLAX 2D LVIDd:         3.60 cm LVIDs:         2.35 cm LV PW:         1.15 cm LV IVS:        1.15 cm LVOT diam:     1.80 cm LVOT Area:     2.54 cm  LEFT ATRIUM         Index LA diam:    2.70 cm 1.36 cm/m   AORTA Ao Root diam: 2.80 cm Ao Asc diam:  2.80 cm TRICUSPID VALVE TV [facility] grad:   55.7 mmHg TV Vmax:        3.73 m/s  SHUNTS Systemic Diam: 1.80 cm Adrian Blackwater Electronically signed by Adrian Blackwater Signature Date/Time: 11/14/2020/9:25:56 AM    Final         Scheduled Meds:  apixaban  2.5 mg Oral BID   carvedilol  12.5 mg Oral BID   celecoxib  100 mg Oral BID   cloNIDine  0.1 mg Oral QHS   gabapentin  100 mg Oral Daily   loratadine  10 mg Oral Daily   melatonin  10 mg Oral QHS   olopatadine  1 drop Both Eyes BID   patiromer  8.4 g Oral Daily   polyethylene glycol  17 g Oral BID   senna-docusate  1 tablet Oral BID   sodium chloride flush  3 mL Intravenous Q12H   sodium chloride  1 g Oral TID WC   Continuous Infusions:   LOS: 2  days   Time spent: Greater than 50% of this time was spent in counseling, explanation of diagnosis, planning of further management, and coordination of care.   Voice Recognition Reubin Milan dictation system was used to create this note, attempts have been made to correct errors. Please contact the author with questions and/or clarifications.   Albertine Grates, MD PhD FACP [facility]  Available via Epic secure chat 7am-7pm for nonurgent issues Please page for urgent issues To page the attending provider between 7A-7P or the covering provider during after hours 7P-7A, please log into the web site [email or web address] and access using universal [place] password for that web site. If you do not have the password, please call the  hospital operator.    11/14/2020, 4:23 PM

## 2020-11-15 LAB — URINE CULTURE: Culture: NO GROWTH

## 2020-11-15 LAB — BASIC METABOLIC PANEL
Anion gap: 6 (ref 5–15)
BUN: 25 mg/dL — ABNORMAL HIGH (ref 8–23)
CO2: 28 mmol/L (ref 22–32)
Calcium: 9.9 mg/dL (ref 8.9–10.3)
Chloride: 99 mmol/L (ref 98–111)
Creatinine, Ser: 1.24 mg/dL — ABNORMAL HIGH (ref 0.44–1.00)
GFR, Estimated: 43 mL/min — ABNORMAL LOW (ref >60)
Glucose, Bld: 95 mg/dL (ref 70–99)
Potassium: 4.8 mmol/L (ref 3.5–5.1)
Sodium: 133 mmol/L — ABNORMAL LOW (ref 135–145)

## 2020-11-15 MED ORDER — OLANZAPINE 2.5 MG PO TABS
2.5000 mg | ORAL_TABLET | Freq: Every day | ORAL | Status: DC
  Administered 2020-11-15 – 2020-11-16 (×2): 2.5 mg via ORAL
  Filled 2020-11-15 (×3): qty 1

## 2020-11-15 NOTE — NC FL2 (Signed)
[place] MEDICAID FL2 LEVEL OF CARE SCREENING TOOL     IDENTIFICATION  Patient Name: Katrina Chen Birthdate: 07-25-1935 Sex: female Admission Date (Current Location): 11/12/2020  [facility] and [place] Number:  Chiropodist and Address:  [facility], [address] [place], [place] [address]      Provider Number: [id number]  Attending Physician Name and Address:  Burnadette Pop, MD  Relative Name and Phone Number:  [name] (daughter) [phone]    Current Level of Care: Hospital Recommended Level of Care: Skilled Nursing Facility Prior Approval Number:    Date Approved/Denied:   PASRR Number: [id number]  Discharge Plan: SNF    Current Diagnoses: Patient Active Problem List   Diagnosis Date Noted   Spinal stenosis of lumbar region 11/12/2020   Hyponatremia 11/12/2020   Chest pain 11/05/2020   CAD (coronary artery disease) 11/05/2020   Hyperkalemia 11/05/2020   Obstipation 11/05/2020   History of bipolar disorder 11/05/2020   Atrial fibrillation, chronic (HCC) 11/05/2020   CKD (chronic kidney disease), stage III (HCC) 11/05/2020   Frail elderly 11/05/2020   Weakness 11/05/2020   Protein-calorie malnutrition (HCC) 11/05/2020   Status post total bilateral knee replacement 05/27/2013   Essential hypertension 04/22/2013    Orientation RESPIRATION BLADDER Height & Weight     Self, Place  Normal Incontinent, External catheter Weight: 86.6 kg Height:  5\' 7"  (170.2 cm)  BEHAVIORAL SYMPTOMS/MOOD NEUROLOGICAL BOWEL NUTRITION STATUS      Incontinent Diet (see discharge summary)  AMBULATORY STATUS COMMUNICATION OF NEEDS Skin   Extensive Assist Verbally Normal                       Personal Care Assistance Level of Assistance  Bathing, Feeding, Dressing Bathing Assistance: Maximum assistance Feeding assistance: Limited assistance Dressing Assistance: Maximum assistance Total Care Assistance: Maximum assistance   Functional  Limitations Info  Sight, Hearing, Speech Sight Info: Adequate Hearing Info: Adequate Speech Info: Adequate    SPECIAL CARE FACTORS FREQUENCY                       Contractures Contractures Info: Not present    Additional Factors Info  Code Status, Allergies Code Status Info: DNR Allergies Info: Iodine, amlodipine, nifedipine, lisinopril, quinapril, shellfish allergy, simvastatin           Current Medications (11/15/2020):  This is the current hospital active medication list Current Facility-Administered Medications  Medication Dose Route Frequency Provider Last Rate Last Admin   acetaminophen (TYLENOL) tablet 650 mg  650 mg Oral Q6H PRN 01/15/2021, MD       Or   acetaminophen (TYLENOL) suppository 650 mg  650 mg Rectal Q6H PRN Venora Maples, MD       antiseptic oral rinse (BIOTENE) solution 15 mL  15 mL Mouth Rinse PRN Venora Maples, MD       apixaban Albertine Grates) tablet 2.5 mg  2.5 mg Oral BID Everlene Balls, MD   2.5 mg at 11/15/20 01/15/21   bisacodyl (DULCOLAX) suppository 10 mg  10 mg Rectal Daily PRN 5916, MD       carvedilol (COREG) tablet 25 mg  25 mg Oral BID Venora Maples, MD   25 mg at 11/15/20 01/15/21   celecoxib (CELEBREX) capsule 100 mg  100 mg Oral BID 3846, MD   100 mg at 11/15/20 01/15/21   cloNIDine (CATAPRES) tablet 0.1 mg  0.1 mg Oral QHS 6599,  Parke Poisson, MD   0.1 mg at 11/14/20 2125   gabapentin (NEURONTIN) capsule 100 mg  100 mg Oral Daily Venora Maples, MD   100 mg at 11/15/20 0904   loratadine (CLARITIN) tablet 10 mg  10 mg Oral Daily Venora Maples, MD   10 mg at 11/15/20 7353   melatonin tablet 10 mg  10 mg Oral QHS Venora Maples, MD   10 mg at 11/14/20 2125   olopatadine (PATANOL) 0.1 % ophthalmic solution 1 drop  1 drop Both Eyes BID Venora Maples, MD   1 drop at 11/15/20 0905   ondansetron (ZOFRAN) tablet 4 mg  4 mg Oral Q6H PRN Venora Maples, MD       Or   ondansetron [facility]) injection 4 mg  4 mg  Intravenous Q6H PRN Venora Maples, MD       oxyCODONE (Oxy IR/ROXICODONE) immediate release tablet 5 mg  5 mg Oral Q4H PRN Venora Maples, MD   5 mg at 11/14/20 2125   patiromer (VELTASSA) packet 8.4 g  8.4 g Oral Daily Venora Maples, MD   8.4 g at 11/15/20 1000   polyethylene glycol (MIRALAX / GLYCOLAX) packet 17 g  17 g Oral Daily PRN Venora Maples, MD       polyethylene glycol (MIRALAX / GLYCOLAX) packet 17 g  17 g Oral BID Albertine Grates, MD   17 g at 11/15/20 2992   senna-docusate (Senokot-S) tablet 1 tablet  1 tablet Oral BID Albertine Grates, MD   1 tablet at 11/15/20 0904   sodium chloride flush (NS) 0.9 % injection 3 mL  3 mL Intravenous Q12H Venora Maples, MD   3 mL at 11/15/20 0910   sodium chloride tablet 1 g  1 g Oral TID WC Venora Maples, MD   1 g at 11/15/20 1149   sodium phosphate (FLEET) 7-19 GM/118ML enema 1 enema  1 enema Rectal Once PRN Venora Maples, MD       traZODone (DESYREL) tablet 25 mg  25 mg Oral QHS PRN Venora Maples, MD         Discharge Medications: Please see discharge summary for a list of discharge medications.  Relevant Imaging Results:  Relevant Lab Results:   Additional Information SSN: [id number]  Allayne Butcher, RN

## 2020-11-15 NOTE — TOC Initial Note (Signed)
Transition of Care [facility]) - Initial/Assessment Note    Patient Details  Name: Katrina Chen MRN: [id number] Date of Birth: [birth year]  Transition of Care [facility]) CM/SW Contact:    Shelbie Hutching, RN Phone Number: 11/15/2020, 1:20 PM  Clinical Narrative:                 Patient admitted to the hospital with generalized abd pain, intractable nausea and vomiting and hyponatremia.  RNCM met with patient and patient's daughter, Caren Griffins, at the bedside today.  Patient is from [facility] where she resides long term.  Plan for discharge will be for patient to return to [facility].  Patient is bed bound at baseline.  TOC will follow and assist with discharge planning.   Expected Discharge Plan: Skilled Nursing Facility Barriers to Discharge: Continued Medical Work up   Patient Goals and CMS Choice Patient states their goals for this hospitalization and ongoing recovery are:: Patient will return to [facility] CMS Medicare.gov Compare Post Acute Care list provided to:: Patient Represenative (must comment) Choice offered to / list presented to : Adult Children  Expected Discharge Plan and Services Expected Discharge Plan: [place]   Discharge Planning Services: CM Consult Post Acute Care Choice: Resumption of Svcs/PTA Provider, [place] arrangements for the past 2 months: [place]                 DME Arranged: N/A DME Agency: NA       HH Arranged: NA [place] Agency: NA        Prior Living Arrangements/Services Living arrangements for the past 2 months: [place] Lives with:: Facility Resident Patient language and need for interpreter reviewed:: No Do you feel safe going back to the place where you live?: Yes      Need for Family Participation in Patient Care: Yes (Comment) Care giver support system in place?: Yes (comment) (daughter)   Criminal Activity/Legal Involvement Pertinent to Current Situation/Hospitalization: No  - Comment as needed  Activities of Daily Living Home Assistive Devices/Equipment: Environmental consultant (specify type) ADL Screening (condition at time of admission) Patient's cognitive ability adequate to safely complete daily activities?: No Is the patient deaf or have difficulty hearing?: No Does the patient have difficulty seeing, even when wearing glasses/contacts?: No Does the patient have difficulty concentrating, remembering, or making decisions?: Yes Patient able to express need for assistance with ADLs?: Yes Does the patient have difficulty dressing or bathing?: Yes Independently performs ADLs?: No Communication: Independent Dressing (OT): Needs assistance Is this a change from baseline?: Pre-admission baseline Grooming: Needs assistance Is this a change from baseline?: Pre-admission baseline Feeding: Needs assistance Is this a change from baseline?: Pre-admission baseline Bathing: Needs assistance Is this a change from baseline?: Pre-admission baseline Toileting: Needs assistance Is this a change from baseline?: Pre-admission baseline In/Out Bed: Needs assistance Is this a change from baseline?: Pre-admission baseline Walks in Home: Needs assistance Is this a change from baseline?: Pre-admission baseline Does the patient have difficulty walking or climbing stairs?: Yes Weakness of Legs: Both Weakness of Arms/Hands: Both  Permission Sought/Granted Permission sought to share information with : Case Manager, Customer service manager, Family Supports Permission granted to share information with : Yes, Verbal Permission Granted  Share Information with NAME: [name]  Permission granted to share info w AGENCY: [facility]  Permission granted to share info w Relationship: daughter  Permission granted to share info w Contact Information: [phone]  Emotional Assessment Appearance:: Appears stated age  Attitude/Demeanor/Rapport: Engaged Affect (typically observed):  Accepting Orientation: : Oriented to Self, Oriented to Place Alcohol / Substance Use: Not Applicable Psych Involvement: No (comment)  Admission diagnosis:  Hyponatremia [E87.1] Generalized abdominal pain [R10.84] Non-intractable vomiting with nausea, unspecified vomiting type [R11.2] Patient Active Problem List   Diagnosis Date Noted   Spinal stenosis of lumbar region 11/12/2020   Hyponatremia 11/12/2020   Chest pain 11/05/2020   CAD (coronary artery disease) 11/05/2020   Hyperkalemia 11/05/2020   Obstipation 11/05/2020   History of bipolar disorder 11/05/2020   Atrial fibrillation, chronic ([place]) 11/05/2020   CKD (chronic kidney disease), stage III ([place]) 11/05/2020   Frail elderly 11/05/2020   Weakness 11/05/2020   Protein-calorie malnutrition ([place]) 11/05/2020   Status post total bilateral knee replacement 05/27/2013   Essential hypertension 04/22/2013   PCP:  Youlanda Roys, MD Pharmacy:  No Pharmacies Listed    Social Determinants of Health (SDOH) Interventions    Readmission Risk Interventions No flowsheet data found.

## 2020-11-15 NOTE — Progress Notes (Signed)
PROGRESS NOTE    Katrina Chen  HGD:[id number] DOB: Dec 07, 1935 DOA: 11/12/2020 PCP: Shawn Stall, MD   Chief Complain: Abdominal pain  Brief Narrative: Patient is a 85 year old female with history of hypertension, spinal stenosis, coronary artery disease, bipolar disorder, A. fib on Eliquis, CKD, failure to thrive who presents initially from SNF with abdominal pain.  She was found to be constipated, started on bowel regimen.  Hospital course was remarkable for hyponatremia consistent with SIADH, improved with sodium chloride tablets.  Plan for discharge back to skilled nursing facility.  Assessment & Plan:   Active Problems:   CAD (coronary artery disease)   History of bipolar disorder   Atrial fibrillation, chronic (HCC)   CKD (chronic kidney disease), stage III (HCC)   Frail elderly   Essential hypertension   Spinal stenosis of lumbar region   Hyponatremia   Hyponatremia: Picture was consistent with SIADH.  Sodium level improving with salt tablets.  Sodium level of 133 today.  Cymbalta and Zyprexa have been held, may need to be resumed on discharge.  Constipation: Continue bowel regimen.  Currently having bowel movement  Hyperkalemia: Currently stable.  Losartan discontinued  Lethargy/weakness: SNF placement.  Planning for discharging her back.  Hypertension: On Coreg, clonidine.  Losartan discontinued due to hyper kalemia  CKD stage IIIa: Currently kidney function at baseline  History of bipolar disorder: On Zyprexa and Cymbalta.  Currently on hold due to hyponatremia.  History of paroxysmal A. fib: Status post pacemaker: On Coreg for rate control and Eliquis for anticoagulation           DVT prophylaxis:Eliquis Code Status:Dnr Family Communication: Called daughter on phone on 11/15/20,call not receive Status is: Inpatient  Remains inpatient appropriate because:Inpatient level of care appropriate due to severity of illness  Dispo: The patient is from: SNF               Anticipated d/c is to: SNF              Patient currently is not medically stable to d/c.   Difficult to place patient No    Consultants: None  Procedures:None  Antimicrobials:  Anti-infectives (From admission, onward)    None       Subjective:  Patient seen and examined at the bedside this afternoon.  Hemodynamically stable.  Sitting on the bed.  Alert and oriented.  Comfortable.  Denies any complaints today.   Objective: Vitals:   11/14/20 2054 11/14/20 2348 11/15/20 0520 11/15/20 0745  BP: (!) 147/59 (!) 128/52 (!) 171/68 (!) 143/68  Pulse: 60 61 72 61  Resp: 16 11 16 16   Temp: 98.1 F (36.7 C) 98.1 F (36.7 C) 97.7 F (36.5 C) 98.2 F (36.8 C)  TempSrc:      SpO2: 98% 99% 99% 100%  Weight:      Height:        Intake/Output Summary (Last 24 hours) at 11/15/2020 1400 Last data filed at 11/15/2020 1038 Gross per 24 hour  Intake 243 ml  Output 650 ml  Net -407 ml   Filed Weights   11/12/20 1308 11/12/20 2139  Weight: 76.7 kg 86.6 kg    Examination:  General exam: Overall comfortable, not in distress,obese.  Pleasant elderly female HEENT: PERRL Respiratory system:  no wheezes or crackles  Cardiovascular system: S1 & S2 heard, RRR.  Gastrointestinal system: Abdomen is nondistended, soft and nontender. Central nervous system: Alert and oriented Extremities: No edema, no clubbing ,no cyanosis Skin: No rashes, no ulcers,no  icterus       Data Reviewed: I have personally reviewed following labs and imaging studies  CBC: Recent Labs  Lab 11/12/20 1259 11/13/20 0226  WBC 6.7 6.9  NEUTROABS 5.1  --   HGB 12.1 11.5*  HCT 36.6 34.1*  MCV 84.5 84.6  PLT 257 217   Basic Metabolic Panel: Recent Labs  Lab 11/13/20 0954 11/13/20 1448 11/13/20 2041 11/14/20 0248 11/15/20 0541  NA 126* 124* 125* 127* 133*  K 4.8 4.4 4.9 5.3* 4.8  CL 93* 93* 94* 95* 99  CO2 25 27 21* 27 28  GLUCOSE 99 106* 89 88 95  BUN 23 26* 26* 26* 25*  CREATININE 1.29* 1.37*  1.33* 1.38* 1.24*  CALCIUM 9.7 9.2 9.5 9.8 9.9   GFR: Estimated Creatinine Clearance: 38.9 mL/min (A) (by C-G formula based on SCr of 1.24 mg/dL (H)). Liver Function Tests: Recent Labs  Lab 11/12/20 1259  AST 21  ALT 12  ALKPHOS 59  BILITOT 0.7  PROT 6.7  ALBUMIN 3.4*   Recent Labs  Lab 11/12/20 1259  LIPASE 20   No results for input(s): AMMONIA in the last 168 hours. Coagulation Profile: No results for input(s): INR, PROTIME in the last 168 hours. Cardiac Enzymes: Recent Labs  Lab 11/12/20 1259  CKTOTAL 98   BNP (last 3 results) No results for input(s): PROBNP in the last 8760 hours. HbA1C: No results for input(s): HGBA1C in the last 72 hours. CBG: No results for input(s): GLUCAP in the last 168 hours. Lipid Profile: No results for input(s): CHOL, HDL, LDLCALC, TRIG, CHOLHDL, LDLDIRECT in the last 72 hours. Thyroid Function Tests: No results for input(s): TSH, T4TOTAL, FREET4, T3FREE, THYROIDAB in the last 72 hours. Anemia Panel: No results for input(s): VITAMINB12, FOLATE, FERRITIN, TIBC, IRON, RETICCTPCT in the last 72 hours. Sepsis Labs: No results for input(s): PROCALCITON, LATICACIDVEN in the last 168 hours.  Recent Results (from the past 240 hour(s))  Resp Panel by RT-PCR (Flu A&B, Covid) Nasopharyngeal Swab     Status: None   Collection Time: 11/05/20  3:35 PM   Specimen: Nasopharyngeal Swab; Nasopharyngeal(NP) swabs in vial transport medium  Result Value Ref Range Status   SARS Coronavirus 2 by RT PCR NEGATIVE NEGATIVE Final    Comment: (NOTE) SARS-CoV-2 target nucleic acids are NOT DETECTED.  The SARS-CoV-2 RNA is generally detectable in upper respiratory specimens during the acute phase of infection. The lowest concentration of SARS-CoV-2 viral copies this assay can detect is 138 copies/mL. A negative result does not preclude SARS-Cov-2 infection and should not be used as the sole basis for treatment or other patient management decisions. A  negative result may occur with  improper specimen collection/handling, submission of specimen other than nasopharyngeal swab, presence of viral mutation(s) within the areas targeted by this assay, and inadequate number of viral copies(<138 copies/mL). A negative result must be combined with clinical observations, patient history, and epidemiological information. The expected result is Negative.  Fact Sheet for Patients:  BloggerCourse.com  Fact Sheet for Healthcare Providers:  SeriousBroker.it  This test is no t yet approved or cleared by the [place] FDA and  has been authorized for detection and/or diagnosis of SARS-CoV-2 by FDA under an Emergency Use Authorization (EUA). This EUA will remain  in effect (meaning this test can be used) for the duration of the COVID-19 declaration under Section 564(b)(1) of the Act, 21 U.S.C.section 360bbb-3(b)(1), unless the authorization is terminated  or revoked sooner.  Influenza A by PCR NEGATIVE NEGATIVE Final   Influenza B by PCR NEGATIVE NEGATIVE Final    Comment: (NOTE) The Xpert Xpress SARS-CoV-2/FLU/RSV plus assay is intended as an aid in the diagnosis of influenza from Nasopharyngeal swab specimens and should not be used as a sole basis for treatment. Nasal washings and aspirates are unacceptable for Xpert Xpress SARS-CoV-2/FLU/RSV testing.  Fact Sheet for Patients: BloggerCourse.com  Fact Sheet for Healthcare Providers: SeriousBroker.it  This test is not yet approved or cleared by the [place] FDA and has been authorized for detection and/or diagnosis of SARS-CoV-2 by FDA under an Emergency Use Authorization (EUA). This EUA will remain in effect (meaning this test can be used) for the duration of the COVID-19 declaration under Section 564(b)(1) of the Act, 21 U.S.C. section 360bbb-3(b)(1), unless the authorization  is terminated or revoked.  Performed at [facility], [address] [place], [place] [address]   MRSA Next Gen by PCR, Nasal     Status: None   Collection Time: 11/13/20  1:20 PM   Specimen: Nasal Mucosa; Nasal Swab  Result Value Ref Range Status   MRSA by PCR Next Gen NOT DETECTED NOT DETECTED Final    Comment: (NOTE) The GeneXpert MRSA Assay (FDA approved for NASAL specimens only), is one component of a comprehensive MRSA colonization surveillance program. It is not intended to diagnose MRSA infection nor to guide or monitor treatment for MRSA infections. Test performance is not FDA approved in patients less than 80 years old. Performed at [facility], [address], [place], [place] [address]   Urine Culture     Status: None   Collection Time: 11/14/20  4:08 PM   Specimen: Urine, Random  Result Value Ref Range Status   Specimen Description   Final    URINE, RANDOM Performed at [facility], [address], [place], [place] [address]    Special Requests   Final    NONE Performed at [facility], [address] [place], [place] [address]    Culture   Final    NO GROWTH Performed at [facility], [address], [place], [place] [address]    Report Status 11/15/2020 FINAL  Final         Radiology Studies: No results found.      Scheduled Meds:  apixaban  2.5 mg Oral BID   carvedilol  25 mg Oral BID   celecoxib  100 mg Oral BID   cloNIDine  0.1 mg Oral QHS   gabapentin  100 mg Oral Daily   loratadine  10 mg Oral Daily   melatonin  10 mg Oral QHS   olopatadine  1 drop Both Eyes BID   patiromer  8.4 g Oral Daily   polyethylene glycol  17 g Oral BID   senna-docusate  1 tablet Oral BID   sodium chloride flush  3 mL Intravenous Q12H   sodium chloride  1 g Oral TID WC   Continuous Infusions:   LOS: 3 days    Time spent: 25 mins.More than 50% of that time was spent in counseling and/or coordination of  care.      Burnadette Pop, MD [facility] P9/07/2020, 2:00 PM

## 2020-11-16 LAB — BASIC METABOLIC PANEL
Anion gap: 7 (ref 5–15)
BUN: 24 mg/dL — ABNORMAL HIGH (ref 8–23)
CO2: 26 mmol/L (ref 22–32)
Calcium: 10.1 mg/dL (ref 8.9–10.3)
Chloride: 103 mmol/L (ref 98–111)
Creatinine, Ser: 1.27 mg/dL — ABNORMAL HIGH (ref 0.44–1.00)
GFR, Estimated: 42 mL/min — ABNORMAL LOW (ref >60)
Glucose, Bld: 103 mg/dL — ABNORMAL HIGH (ref 70–99)
Potassium: 4.5 mmol/L (ref 3.5–5.1)
Sodium: 136 mmol/L (ref 135–145)

## 2020-11-16 LAB — RESP PANEL BY RT-PCR (FLU A&B, COVID) ARPGX2
Influenza A by PCR: NEGATIVE
Influenza B by PCR: NEGATIVE
SARS Coronavirus 2 by RT PCR: NEGATIVE

## 2020-11-16 LAB — RHEUMATOID FACTOR: Rheumatoid fact SerPl-aCnc: 12 IU/mL (ref <14.0)

## 2020-11-16 MED ORDER — CELECOXIB 100 MG PO CAPS: 100.0000 mg | ORAL_CAPSULE | Freq: Two times a day (BID) | ORAL | Status: DC | PRN

## 2020-11-16 MED ORDER — TAMSULOSIN HCL 0.4 MG PO CAPS: 0.4000 mg | ORAL_CAPSULE | Freq: Every day | ORAL | Status: DC

## 2020-11-16 MED ORDER — POLYETHYLENE GLYCOL 3350 17 G PO PACK: 17.0000 g | PACK | Freq: Two times a day (BID) | ORAL | 0 refills | Status: AC

## 2020-11-16 MED ORDER — LABETALOL HCL 5 MG/ML IV SOLN
10.0000 mg | Freq: Once | INTRAVENOUS | Status: AC
  Administered 2020-11-16: 10 mg via INTRAVENOUS
  Filled 2020-11-16: qty 4

## 2020-11-16 MED ORDER — LABETALOL HCL 5 MG/ML IV SOLN
20.0000 mg | Freq: Once | INTRAVENOUS | Status: AC
  Administered 2020-11-16: 14:00:00 20 mg via INTRAVENOUS
  Filled 2020-11-16: qty 4

## 2020-11-16 MED ORDER — LABETALOL HCL 5 MG/ML IV SOLN
10.0000 mg | INTRAVENOUS | Status: DC | PRN
  Administered 2020-11-16: 16:00:00 10 mg via INTRAVENOUS
  Filled 2020-11-16: qty 4

## 2020-11-16 MED ORDER — SODIUM CHLORIDE 1 G PO TABS: 1.0000 g | ORAL_TABLET | Freq: Three times a day (TID) | ORAL | 0 refills | Status: AC

## 2020-11-16 NOTE — Discharge Summary (Addendum)
Physician Discharge Summary  Katrina Chen VXB:[id number] DOB: 02-09-1936 DOA: 11/12/2020  PCP: Shawn Stall, MD  Admit date: 11/12/2020 Discharge date: 11/17/2020  Admitted From: SnF Disposition:  SNF  Discharge Condition:Stable CODE STATUS:DNR Diet recommendation: Heart Healthy    Brief/Interim Summary: Patient is a 85 year old female with history of hypertension, spinal stenosis, coronary artery disease, bipolar disorder, A. fib on Eliquis, CKD, failure to thrive who presents initially from SNF with abdominal pain.  She was found to be constipated, started on bowel regimen.  Hospital course was remarkable for hyponatremia consistent with SIADH, improved with sodium chloride tablets.  Sodium level is normal now.Plan for discharge back to skilled nursing facility.  Following problems were addressed during her hospitalization:   Hyponatremia: Picture was consistent with SIADH.  Sodium level improving with salt tablets.  Sodium level of 136 today.  Cymbalta and Zyprexa have been held,  resumed on discharge.  Continue salt tablets for 3 more days.   Constipation: Continue bowel regimen.  Currently having bowel movement   Hyperkalemia: Currently stable   Lethargy/weakness: SNF resident   Hypertension: On Coreg, clonidine,losartan   CKD stage IIIa: Currently kidney function at baseline   History of bipolar disorder: On Zyprexa and Cymbalta.     History of paroxysmal A. fib: Status post pacemaker: On Coreg for rate control and Eliquis for anticoagulation  Urinary retention: Started on Flomax.  Found to have retained urine here, now,foley replaced.  She needs to follow-up with urology in a week for Foley catheter removal.    Discharge Diagnoses:  Active Problems:   CAD (coronary artery disease)   History of bipolar disorder   Atrial fibrillation, chronic (HCC)   CKD (chronic kidney disease), stage III (HCC)   Frail elderly   Essential hypertension   Spinal stenosis of lumbar region    Hyponatremia    Discharge Instructions  Discharge Instructions     Diet - low sodium heart healthy   Complete by: As directed    Discharge instructions   Complete by: As directed    1)Please take prescribed medication as instructed 2)Do  a BMP test in a week to check your sodium level 3)Follow up with urology as an outpatient in a week for Foley removal.  Name and number the provider has been attached   Increase activity slowly   Complete by: As directed       Allergies as of 11/17/2020       Reactions   Iodine Swelling   Amlodipine Other (See Comments)   Nifedipine Swelling, Other (See Comments)   Lisinopril Swelling   Quinapril Swelling   Shellfish Allergy    Simvastatin Swelling        Medication List     TAKE these medications    bisacodyl 10 MG suppository Commonly known as: DULCOLAX Place 1 suppository (10 mg total) rectally daily as needed for mild constipation.   carvedilol 6.25 MG tablet Commonly known as: COREG Take 12.5 mg by mouth 2 (two) times daily.   celecoxib 100 MG capsule Commonly known as: CELEBREX Take 1 capsule (100 mg total) by mouth 2 (two) times daily as needed. What changed:  when to take this reasons to take this   cetirizine 5 MG tablet Commonly known as: ZYRTEC Take 5 mg by mouth daily.   cloNIDine 0.1 MG tablet Commonly known as: CATAPRES Take 0.1 mg by mouth 2 (two) times daily.   DULoxetine 60 MG capsule Commonly known as: CYMBALTA Take 60 mg by mouth  2 (two) times daily.   Eliquis 2.5 MG Tabs tablet Generic drug: apixaban Take 2.5 mg by mouth 2 (two) times daily.   gabapentin 100 MG capsule Commonly known as: NEURONTIN Take 100 mg by mouth daily.   losartan 100 MG tablet Commonly known as: COZAAR Take 100 mg by mouth daily.   melatonin 5 MG Tabs Take 10 mg by mouth at bedtime.   OLANZapine 2.5 MG tablet Commonly known as: ZYPREXA Take 2.5 mg by mouth at bedtime.   olopatadine 0.1 % ophthalmic  solution Commonly known as: PATANOL Place 1 drop into both eyes 2 (two) times daily.   polyethylene glycol 17 g packet Commonly known as: MIRALAX / GLYCOLAX Take 17 g by mouth 2 (two) times daily. What changed: when to take this   senna-docusate 8.6-50 MG tablet Commonly known as: Senokot-S Take 1 tablet by mouth 2 (two) times daily.   sodium chloride 1 g tablet Take 1 tablet (1 g total) by mouth 3 (three) times daily with meals for 3 days.   sodium phosphate 7-19 GM/118ML Enem Place 133 mLs (1 enema total) rectally daily as needed for severe constipation.   tamsulosin 0.4 MG Caps capsule Commonly known as: Flomax Take 1 capsule (0.4 mg total) by mouth daily.   traMADol 50 MG tablet Commonly known as: ULTRAM Take 50 mg by mouth every 6 (six) hours as needed for pain.        Contact information for follow-up providers     Vanna Scotland, MD. Schedule an appointment as soon as possible for a visit in 1 week(s).   Specialty: Urology Contact information: [address] [place] [address] [phone]              Contact information for after-discharge care     Destination     [facility] : [facility] Preferred SNF .   Service: Skilled Nursing Contact information: [address] [place] [address] [phone]                    Allergies  Allergen Reactions   Iodine Swelling   Amlodipine Other (See Comments)   Nifedipine Swelling and Other (See Comments)   Lisinopril Swelling   Quinapril Swelling   Shellfish Allergy    Simvastatin Swelling    Consultations: None   Procedures/Studies: CT Abdomen Pelvis Wo Contrast  Result Date: 11/12/2020 CLINICAL DATA:  Acute nonlocalized abdominal pain, history of diverticulosis EXAM: CT ABDOMEN AND PELVIS WITHOUT CONTRAST TECHNIQUE: Multidetector CT imaging of the abdomen and pelvis was performed following the standard protocol without IV contrast. Sagittal  and coronal MPR images reconstructed from axial data set. No oral contrast. Small RIGHT pleural effusion. Trace LEFT pleural fluid. Bibasilar atelectasis greater on RIGHT. COMPARISON:  Gallbladder and liver normal appearance FINDINGS: Lower chest: Bibasilar atelectasis and minimal pleural effusions greater on RIGHT. Hepatobiliary: Gallbladder and liver normal appearance Pancreas: Atrophic pancreas Spleen: Unremarkable Adrenals/Urinary Tract: Adrenal glands normal appearance. Small LEFT renal cyst unchanged 11 mm diameter. Kidneys and ureters normal appearance. Mild diffuse bladder wall thickening without mass, though bladder is underdistended. Stomach/Bowel: Normal appendix. Scattered stool throughout colon. Extensive diverticulosis of distal descending and sigmoid colon without definite evidence of diverticulitis. Questionable mild rectal wall thickening distally. Stomach and remaining bowel loops unremarkable. Vascular/Lymphatic: Extensive atherosclerotic calcifications aorta, iliac arteries, femoral arteries, visceral arteries. Aorta normal caliber. Pacemaker lead RIGHT ventricle. Enlargement of RIGHT atrium. No adenopathy. Reproductive: Uterus surgically absent. Unremarkable LEFT ovary. Cyst within  RIGHT ovary 2.9 x 3.0 cm image 53; this is a simple appearing cyst and no follow-up imaging is recommended. Other: Umbilical hernia containing fat. No free air or free fluid. No acute inflammatory process. Musculoskeletal: Prior lumbar fusion L3-S1. Osseous demineralization with degenerative disc disease changes. IMPRESSION: Extensive distal colonic diverticulosis without evidence of diverticulitis. Questionable mild rectal wall thickening distally, recommend correlation with digital rectal exam and proctoscopy. Umbilical hernia containing fat. Bibasilar atelectasis and minimal pleural effusions greater on RIGHT. Aortic Atherosclerosis (ICD10-I70.0). Electronically Signed   By: Ulyses Southward M.D.   On: 11/12/2020 15:10    CT ABDOMEN PELVIS WO CONTRAST  Result Date: 11/05/2020 CLINICAL DATA:  Flank pain, kidney stone suspected Low back pain, increased fracture risk Back and flank pain, unknown cause. Denies falling, pt states "it feels like my back is breaking!" EXAM: CT ABDOMEN AND PELVIS WITHOUT CONTRAST CT LUMBAR SPINE WITHOUT CONTRAST TECHNIQUE: Multidetector CT imaging of the abdomen and pelvis was performed following the standard protocol without IV contrast. Multidetector CT imaging of the lumbar spine was performed without intravenous contrast administration. Multiplanar CT image reconstructions were also generated. COMPARISON:  None. FINDINGS: CT ABD/PELVIS Lower chest: Linear atelectasis versus scarring of the right base. Partially visualized cardiac lead. Otherwise no acute abnormality. Hepatobiliary: No focal liver abnormality. No gallstones, gallbladder wall thickening, or pericholecystic fluid. No biliary dilatation. Pancreas: No focal lesion. Normal pancreatic contour. No surrounding inflammatory changes. No main pancreatic ductal dilatation. Spleen: Normal in size without focal abnormality. Adrenals/Urinary Tract: No adrenal nodule bilaterally. No nephrolithiasis and no hydronephrosis. No definite contour-deforming renal mass. No ureterolithiasis or hydroureter. The urinary bladder is distended with urine and grossly unremarkable. Stomach/Bowel: Stomach is within normal limits. No evidence of bowel wall thickening or dilatation. Stool throughout the left colon. Diffuse sigmoid diverticulosis. No pneumatosis. Appendix appears normal. Vascular/Lymphatic: No abdominal aorta or iliac aneurysm. Severe atherosclerotic plaque of the aorta and its branches. No abdominal, pelvic, or inguinal lymphadenopathy. Reproductive: Status post hysterectomy. No adnexal masses. Other: No intraperitoneal free fluid. No intraperitoneal free gas. No organized fluid collection. Musculoskeletal: No abdominal wall hernia or abnormality. No  suspicious lytic or blastic osseous lesions. No acute displaced fracture. CT LUMBAR Segmentation: 5 non rib-bearing lumbar report T Wo bodies are identified Alignment: Straightening of the normal lumbar lordosis likely due to positioning and surgical hardware. Vertebrae: Status post L3 through S1 posterolateral and interbody fusion. Status post laminectomy at these levels. Multilevel degenerative changes of the spine. No acute fracture or focal pathologic process. No suspicious lytic or blastic osseous lesions. There is a densely sclerotic lesion within the right iliac bone that may represent a bone island. Well-defined lytic lesion with a narrow zone of transmission within the right iliac bone along the sacroiliac joint is likely of benign etiology. Paraspinal and other soft tissues: Negative. IMPRESSION: 1. No acute intra-abdominal or intrapelvic abnormality with limited evaluation on this noncontrast study. 2. Diffuse sigmoid diverticulosis with no acute diverticulitis. 3. Stool throughout the left colon. 4. No acute displaced fracture or traumatic listhesis of the lumbar spine in a patient status post L3 through S1 posterolateral and interbody fusion. 5. A densely sclerotic lesion within the right iliac bone may represent a bone island. Comparison with prior imaging would be of value. 6.  Aortic Atherosclerosis (ICD10-I70.0)  - severe. Electronically Signed   By: Tish Frederickson M.D.   On: 11/05/2020 18:19   DG Chest 2 View  Result Date: 11/05/2020 CLINICAL DATA:  Chest tightness for 3 days EXAM:  CHEST - 2 VIEW COMPARISON:  None. FINDINGS: Frontal and lateral views of the chest demonstrate an unremarkable cardiac silhouette. Loop recorder is seen within the left anterior chest. No airspace disease, effusion, or pneumothorax. No acute bony abnormalities. IMPRESSION: 1. No acute intrathoracic process. Electronically Signed   By: Sharlet Salina M.D.   On: 11/05/2020 15:42   DG Abd 1 View  Result Date:  11/07/2020 CLINICAL DATA:  Constipation.  Status post enema EXAM: ABDOMEN - 1 VIEW COMPARISON:  11/07/2020 FINDINGS: Similar appearance of moderate stool burden within the colon. No dilated bowel loops identified. Postsurgical changes within the lumbar spine again noted. IMPRESSION: Similar moderate stool burden compared to 11/05/2020. Electronically Signed   By: Signa Kell M.D.   On: 11/07/2020 07:59   CT HEAD WO CONTRAST ( )  Result Date: 11/05/2020 CLINICAL DATA:  Mental status change. Unknown cause. Pain all over. Chest tightness. EXAM: CT HEAD WITHOUT CONTRAST TECHNIQUE: Contiguous axial images were obtained from the base of the skull through the vertex without intravenous contrast. COMPARISON:  None. BRAIN: Atherosclerotic calcifications are present within the cavernous internal carotid arteries. No evidence of large-territorial acute infarction. No parenchymal hemorrhage. No mass lesion. No extra-axial collection. No mass effect or midline shift. No hydrocephalus. Basilar cisterns are patent. Vascular: No hyperdense vessel. Skull: No acute fracture or focal lesion. Sinuses/Orbits: Paranasal sinuses and mastoid air cells are clear. Bilateral lens replacement. Otherwise orbits are unremarkable. Other: None. IMPRESSION: No acute intracranial abnormality. Electronically Signed   By: Tish Frederickson M.D.   On: 11/05/2020 18:07   CT L-SPINE NO CHARGE  Result Date: 11/05/2020 CLINICAL DATA:  Flank pain, kidney stone suspected Low back pain, increased fracture risk Back and flank pain, unknown cause. Denies falling, pt states "it feels like my back is breaking!" EXAM: CT ABDOMEN AND PELVIS WITHOUT CONTRAST CT LUMBAR SPINE WITHOUT CONTRAST TECHNIQUE: Multidetector CT imaging of the abdomen and pelvis was performed following the standard protocol without IV contrast. Multidetector CT imaging of the lumbar spine was performed without intravenous contrast administration. Multiplanar CT image reconstructions  were also generated. COMPARISON:  None. FINDINGS: CT ABD/PELVIS Lower chest: Linear atelectasis versus scarring of the right base. Partially visualized cardiac lead. Otherwise no acute abnormality. Hepatobiliary: No focal liver abnormality. No gallstones, gallbladder wall thickening, or pericholecystic fluid. No biliary dilatation. Pancreas: No focal lesion. Normal pancreatic contour. No surrounding inflammatory changes. No main pancreatic ductal dilatation. Spleen: Normal in size without focal abnormality. Adrenals/Urinary Tract: No adrenal nodule bilaterally. No nephrolithiasis and no hydronephrosis. No definite contour-deforming renal mass. No ureterolithiasis or hydroureter. The urinary bladder is distended with urine and grossly unremarkable. Stomach/Bowel: Stomach is within normal limits. No evidence of bowel wall thickening or dilatation. Stool throughout the left colon. Diffuse sigmoid diverticulosis. No pneumatosis. Appendix appears normal. Vascular/Lymphatic: No abdominal aorta or iliac aneurysm. Severe atherosclerotic plaque of the aorta and its branches. No abdominal, pelvic, or inguinal lymphadenopathy. Reproductive: Status post hysterectomy. No adnexal masses. Other: No intraperitoneal free fluid. No intraperitoneal free gas. No organized fluid collection. Musculoskeletal: No abdominal wall hernia or abnormality. No suspicious lytic or blastic osseous lesions. No acute displaced fracture. CT LUMBAR Segmentation: 5 non rib-bearing lumbar report T Wo bodies are identified Alignment: Straightening of the normal lumbar lordosis likely due to positioning and surgical hardware. Vertebrae: Status post L3 through S1 posterolateral and interbody fusion. Status post laminectomy at these levels. Multilevel degenerative changes of the spine. No acute fracture or focal pathologic process. No suspicious lytic or blastic  osseous lesions. There is a densely sclerotic lesion within the right iliac bone that may  represent a bone island. Well-defined lytic lesion with a narrow zone of transmission within the right iliac bone along the sacroiliac joint is likely of benign etiology. Paraspinal and other soft tissues: Negative. IMPRESSION: 1. No acute intra-abdominal or intrapelvic abnormality with limited evaluation on this noncontrast study. 2. Diffuse sigmoid diverticulosis with no acute diverticulitis. 3. Stool throughout the left colon. 4. No acute displaced fracture or traumatic listhesis of the lumbar spine in a patient status post L3 through S1 posterolateral and interbody fusion. 5. A densely sclerotic lesion within the right iliac bone may represent a bone island. Comparison with prior imaging would be of value. 6.  Aortic Atherosclerosis (ICD10-I70.0)  - severe. Electronically Signed   By: Tish Frederickson M.D.   On: 11/05/2020 18:19   ECHOCARDIOGRAM COMPLETE  Result Date: 11/14/2020    ECHOCARDIOGRAM REPORT   Patient Name:   NEAVEH BELANGER Date of Exam: 11/13/2020 Medical Rec #:  [id number]  Height:       67.0 in Accession #:    [id number] Weight:       190.9 lb Date of Birth:  09-11-1935  BSA:          1.983 m Patient Age:    85 years   BP:           143/71 mmHg Patient Gender: F          HR:           60 bpm. Exam Location:  [facility] Procedure: 2D Echo and Intracardiac Opacification Agent Indications:     Elevated Troponin  History:         Patient has no prior history of Echocardiogram examinations.  Sonographer:     Overton Mam RDCS Referring Phys:  [id number] MATTHEW M ECKSTAT Diagnosing Phys: Adrian Blackwater  Sonographer Comments: Technically difficult study due to poor echo windows, suboptimal parasternal window, suboptimal subcostal window and suboptimal apical window. IMPRESSIONS  1. Left ventricular ejection fraction, by estimation, is 60 to 65%. The left ventricle has normal function. The left ventricle has no regional wall motion abnormalities. Left ventricular diastolic function could not be evaluated.  2. Right  ventricular systolic function is normal. The right ventricular size is normal.  3. The mitral valve is normal in structure. No evidence of mitral valve regurgitation. No evidence of mitral stenosis.  4. The aortic valve is normal in structure. Aortic valve regurgitation is not visualized. No aortic stenosis is present.  5. The inferior vena cava is normal in size with greater than 50% respiratory variability, suggesting right atrial pressure of 3 mmHg. FINDINGS  Left Ventricle: Left ventricular ejection fraction, by estimation, is 60 to 65%. The left ventricle has normal function. The left ventricle has no regional wall motion abnormalities. Definity contrast agent was given IV to delineate the left ventricular  endocardial borders. The left ventricular internal cavity size was normal in size. There is no left ventricular hypertrophy. Left ventricular diastolic function could not be evaluated. Right Ventricle: The right ventricular size is normal. No increase in right ventricular wall thickness. Right ventricular systolic function is normal. Left Atrium: Left atrial size was normal in size. Right Atrium: Right atrial size was normal in size. Pericardium: There is no evidence of pericardial effusion. Mitral Valve: The mitral valve is normal in structure. No evidence of mitral valve regurgitation. No evidence of mitral valve stenosis. Tricuspid Valve: The tricuspid valve is normal  in structure. Tricuspid valve regurgitation is not demonstrated. No evidence of tricuspid stenosis. Aortic Valve: The aortic valve is normal in structure. Aortic valve regurgitation is not visualized. No aortic stenosis is present. Pulmonic Valve: The pulmonic valve was normal in structure. Pulmonic valve regurgitation is not visualized. No evidence of pulmonic stenosis. Aorta: The aortic root is normal in size and structure. Venous: The inferior vena cava is normal in size with greater than 50% respiratory variability, suggesting right  atrial pressure of 3 mmHg. IAS/Shunts: No atrial level shunt detected by color flow Doppler.  LEFT VENTRICLE PLAX 2D LVIDd:         3.60 cm LVIDs:         2.35 cm LV PW:         1.15 cm LV IVS:        1.15 cm LVOT diam:     1.80 cm LVOT Area:     2.54 cm  LEFT ATRIUM         Index LA diam:    2.70 cm 1.36 cm/m   AORTA Ao Root diam: 2.80 cm Ao Asc diam:  2.80 cm TRICUSPID VALVE TV Peak grad:   55.7 mmHg TV Vmax:        3.73 m/s  SHUNTS Systemic Diam: 1.80 cm Adrian BlackwaterShaukat Khan Electronically signed by Adrian BlackwaterShaukat Khan Signature Date/Time: 11/14/2020/9:25:56 AM    Final    CT Angio Chest/Abd/Pel for Dissection W and/or Wo Contrast  Result Date: 11/05/2020 CLINICAL DATA:  Abdominal pain, aortic dissection suspected EXAM: CT ANGIOGRAPHY CHEST, ABDOMEN AND PELVIS TECHNIQUE: Non-contrast CT of the chest was initially obtained. Multidetector CT imaging through the chest, abdomen and pelvis was performed using the standard protocol during bolus administration of intravenous contrast. Multiplanar reconstructed images and MIPs were obtained and reviewed to evaluate the vascular anatomy. CONTRAST:  75mL OMNIPAQUE IOHEXOL 350 MG/ML SOLN COMPARISON:  None. FINDINGS: CTA CHEST FINDINGS Cardiovascular: Preferential opacification of the thoracic aorta. No evidence of thoracic aortic aneurysm or dissection. Moderate atherosclerotic plaque of the thoracic aorta. Four-vessel coronary artery calcifications. Normal heart size. No significant pericardial effusion. The main pulmonary artery is normal in caliber. No central pulmonary embolus. Mediastinum/Nodes: No enlarged mediastinal, hilar, or axillary lymph nodes. Multiple hypodensities within thyroid glands. There is a 1.9 cm right thyroid gland hypodense nodule. The trachea and esophagus demonstrate no significant findings. Lungs/Pleura: No focal consolidation. No pulmonary nodule. No pulmonary mass. No pleural effusion. No pneumothorax. Musculoskeletal: No chest wall abnormality. No  suspicious lytic or blastic osseous lesions. No acute displaced fracture. Multilevel mild degenerative changes of the spine. Review of the MIP images confirms the above findings. CTA ABDOMEN AND PELVIS FINDINGS VASCULAR Aorta: Severe calcified and noncalcified atherosclerotic plaque. Normal caliber aorta without aneurysm, dissection, vasculitis. Celiac: At least mild atherosclerotic plaque. At least mild stenosis of the origin of the celiac artery. Patent without evidence of aneurysm, dissection, vasculitis or significant stenosis. SMA: At least mild atherosclerotic plaque. Patent without evidence of aneurysm, dissection, vasculitis or significant stenosis. Renals: Atherosclerotic plaque moderate severe stenosis at the origin of bilateral renal arteries. Opacification of the vessels distally is noted. Both renal arteries are patent without evidence of aneurysm, dissection, vasculitis, fibromuscular dysplasia or significant stenosis. IMA: Atherosclerotic plaque. 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 Opacification of the inferior mesenteric artery is noted distally. Otherwise patent without evidence of aneurysm, dissection, vasculitis or significant stenosis. Inflow: Patent without evidence of aneurysm, dissection, vasculitis or significant stenosis.  Veins: No obvious venous abnormality within the limitations of this arterial phase study. Review of the MIP images confirms the above findings. NON-VASCULAR Hepatobiliary: No focal liver abnormality. No gallstones, gallbladder wall thickening, or pericholecystic fluid. No biliary dilatation. Pancreas: No focal lesion. Normal pancreatic contour. No surrounding inflammatory changes. No main pancreatic ductal dilatation. Spleen: Normal in size without focal abnormality. Adrenals/Urinary Tract: No adrenal nodule bilaterally. Bilateral kidneys enhance symmetrically. 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 No hydronephrosis. No hydroureter. The urinary bladder is distended with urine. Stomach/Bowel: Stomach is within normal limits. No evidence of bowel wall thickening or dilatation. Mid rectal stool ball of 6 cm. Stool noted throughout the left colon. Diffuse sigmoid diverticulosis. No pneumatosis. Appendix appears normal. Lymphatic: No lymphadenopathy. Reproductive: Status post hysterectomy. No adnexal masses. Other: No intraperitoneal free fluid. No intraperitoneal free gas. No organized fluid collection. Musculoskeletal: No abdominal wall hernia or abnormality. No suspicious lytic or blastic osseous lesions. No acute displaced fracture. Status post L3 through S1 posterolateral and interbody usion. Status post laminectomy at these levels. Multilevel dgenerative changes of the spine. Review of the MIP images confirms the above findings. IMPRESSION: 1. No acute aortic abnormality. 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 2. No central pulmonary embolus. 3. No acute intrathoracic, intra-abdominal, intrapelvic abnormality. 4. Stool throughout the left colon with a 6 cm stool ball within the mid rectum. Diffuse sigmoid diverticulosis with no acute diverticulitis. 5. A 1.9 cm right thyroid gland nodule Recommend thyroid US (ref: J Am Coll Radiol. 2015 Feb;12(2): 143-50). In the setting of significant comorbidities or limited life expectancy, no follow-up recommended (ref: J Am Coll Radiol. 2015 Feb;12(2): 143-50). Electronically Signed   By: Tish Frederickson M.D.   On: 11/05/2020 20:20      Subjective:  Patient seen and examined the bedside this morning.  Hemodynamically stable for discharge today.  Discharge planning discussed with the daughter at bedside Discharge Exam: Vitals:   11/16/20 2316 11/17/20 0401  BP: (!)  165/67 (!) 111/42  Pulse: 62 (!) 57  Resp: 18 16  Temp: 97.9 F (36.6 C) 97.9 F (36.6 C)  SpO2: 99% 97%   Vitals:   11/16/20 1623 11/16/20 2217 11/16/20 2316 11/17/20 0401  BP: (!) 160/85 (!) 170/75 (!) 165/67 (!) 111/42  Pulse:  82 62 (!) 57  Resp:  Temp:  98.5 F (36.9 C) 97.9 F (36.6 C) 97.9 F (36.6 C)  TempSrc:  Oral    SpO2:  100% 99% 97%  Weight:      Height:        General: Pt is alert, awake, not in acute distress Cardiovascular: RRR, S1/S2 +, no rubs, no gallops Respiratory: CTA bilaterally, no wheezing, no rhonchi Abdominal: Soft, NT, ND, bowel sounds + Extremities: no edema, no cyanosis    The results of significant diagnostics from this hospitalization (including imaging, microbiology, ancillary and laboratory) are listed below for reference.     Microbiology: Recent Results (from the past 240 hour(s))  MRSA Next Gen by PCR, Nasal     Status: None   Collection Time: 11/13/20  1:20 PM   Specimen: Nasal Mucosa; Nasal Swab  Result Value Ref Range Status   MRSA by PCR Next Gen NOT DETECTED NOT DETECTED Final    Comment: (NOTE) The GeneXpert MRSA Assay (FDA approved for NASAL specimens only), is  one component of a comprehensive MRSA colonization surveillance program. It is not intended to diagnose MRSA infection nor to guide or monitor treatment for MRSA infections. Test performance is not FDA approved in patients less than 59 years old. Performed at [facility], [address], [place], [place] [address]   Urine Culture     Status: None   Collection Time: 11/14/20  4:08 PM   Specimen: Urine, Random  Result Value Ref Range Status   Specimen Description   Final    URINE, RANDOM Performed at [facility], [address], [place], [place] [address]    Special Requests   Final    NONE Performed at [facility], [address], [place], [place] [address]    Culture   Final    NO GROWTH Performed at [facility], [address] [place], [place] [address]    Report Status 11/15/2020 FINAL  Final  Resp Panel by RT-PCR (Flu A&B, Covid) Nasopharyngeal Swab     Status: None   Collection Time: 11/16/20 10:30 AM   Specimen: Nasopharyngeal Swab; Nasopharyngeal(NP) swabs in vial transport medium  Result Value Ref Range Status   SARS Coronavirus 2 by RT PCR NEGATIVE NEGATIVE Final    Comment: (NOTE) SARS-CoV-2 target nucleic acids are NOT DETECTED.  The SARS-CoV-2 RNA is generally detectable in upper respiratory specimens during the acute phase of infection. The lowest concentration of SARS-CoV-2 viral copies this assay can detect is 138 copies/mL. A negative result does not preclude SARS-Cov-2 infection and should not be used as the sole basis for treatment or other patient management decisions. A negative result may occur with  improper specimen collection/handling, submission of specimen other than nasopharyngeal swab, presence of viral mutation(s) within the areas targeted by this assay, and inadequate number of viral copies(<138 copies/mL). A negative result must be combined with clinical observations, patient history, and epidemiological information. The expected result is Negative.  Fact Sheet for Patients:  BloggerCourse.com  Fact Sheet for Healthcare Providers:  SeriousBroker.it  This test is no t yet approved or cleared by the [place] FDA and  has been authorized for detection and/or diagnosis of SARS-CoV-2 by FDA under an Emergency Use Authorization (EUA). This EUA will remain  in effect (meaning this test can be used) for the duration of the COVID-19 declaration under Section 564(b)(1) of the Act, 21 U.S.C.section 360bbb-3(b)(1), unless the authorization is terminated  or revoked sooner.       Influenza A by PCR NEGATIVE NEGATIVE Final   Influenza B by PCR NEGATIVE NEGATIVE Final    Comment: (NOTE) The Xpert  Xpress SARS-CoV-2/FLU/RSV plus assay is intended as an aid in the diagnosis of influenza from Nasopharyngeal swab specimens and should not be used as a sole basis for treatment. Nasal washings and aspirates are unacceptable for Xpert Xpress SARS-CoV-2/FLU/RSV testing.  Fact Sheet for Patients: BloggerCourse.com  Fact Sheet for Healthcare Providers: SeriousBroker.it  This test is not yet approved or cleared by the [place] FDA and has been authorized for detection and/or diagnosis of SARS-CoV-2 by FDA under an Emergency Use Authorization (EUA). This EUA will remain in effect (meaning this test can be used) for the duration of the COVID-19 declaration under Section 564(b)(1) of the Act, 21 U.S.C. section 360bbb-3(b)(1), unless the authorization is terminated or revoked.  Performed at [facility], [address] [place], [place] [address]      Labs: BNP (last 3 results) No results for input(s):  BNP in the last 8760 hours. Basic Metabolic Panel: Recent Labs  Lab 11/13/20 2041 11/14/20 0248 11/15/20 0541 11/16/20 0537 11/17/20 0428  NA 125* 127* 133* 136 136  K 4.9 5.3* 4.8 4.5 4.6  CL 94* 95* 99 103 103  CO2 21* GLUCOSE 89 88 95 103* 111*  BUN 26* 26* 25* 24* 26*  CREATININE 1.33* 1.38* 1.24* 1.27* 1.27*  CALCIUM 9.5 9.8 9.9 10.1 9.7   Liver Function Tests: Recent Labs  Lab 11/12/20 1259  AST 21  ALT 12  ALKPHOS 59  BILITOT 0.7  PROT 6.7  ALBUMIN 3.4*   Recent Labs  Lab 11/12/20 1259  LIPASE 20   No results for input(s): AMMONIA in the last 168 hours. CBC: Recent Labs  Lab 11/12/20 1259 11/13/20 0226  WBC 6.7 6.9  NEUTROABS 5.1  --   HGB 12.1 11.5*  HCT 36.6 34.1*  MCV 84.5 84.6  PLT 257 217   Cardiac Enzymes: Recent Labs  Lab 11/12/20 1259  CKTOTAL 98   BNP: Invalid input(s): POCBNP CBG: No results for input(s): GLUCAP in the last 168 hours. D-Dimer No  results for input(s): DDIMER in the last 72 hours. Hgb A1c No results for input(s): HGBA1C in the last 72 hours. Lipid Profile No results for input(s): CHOL, HDL, LDLCALC, TRIG, CHOLHDL, LDLDIRECT in the last 72 hours. Thyroid function studies No results for input(s): TSH, T4TOTAL, T3FREE, THYROIDAB in the last 72 hours.  Invalid input(s): FREET3 Anemia work up No results for input(s): VITAMINB12, FOLATE, FERRITIN, TIBC, IRON, RETICCTPCT in the last 72 hours. Urinalysis    Component Value Date/Time   COLORURINE YELLOW 11/12/2020 1324   APPEARANCEUR HAZY (A) 11/12/2020 1324   LABSPEC 1.020 11/12/2020 1324   PHURINE 5.0 11/12/2020 1324   GLUCOSEU NEGATIVE 11/12/2020 1324   HGBUR SMALL (A) 11/12/2020 1324   BILIRUBINUR NEGATIVE 11/12/2020 1324   KETONESUR NEGATIVE 11/12/2020 1324   PROTEINUR NEGATIVE 11/12/2020 1324   NITRITE NEGATIVE 11/12/2020 1324   LEUKOCYTESUR SMALL (A) 11/12/2020 1324   Sepsis Labs Invalid input(s): PROCALCITONIN,  WBC,  LACTICIDVEN Microbiology Recent Results (from the past 240 hour(s))  MRSA Next Gen by PCR, Nasal     Status: None   Collection Time: 11/13/20  1:20 PM   Specimen: Nasal Mucosa; Nasal Swab  Result Value Ref Range Status   MRSA by PCR Next Gen NOT DETECTED NOT DETECTED Final    Comment: (NOTE) The GeneXpert MRSA Assay (FDA approved for NASAL specimens only), is one component of a comprehensive MRSA colonization surveillance program. It is not intended to diagnose MRSA infection nor to guide or monitor treatment for MRSA infections. Test performance is not FDA approved in patients less than 53 years old. Performed at [facility], [address], [place], [place] [address]   Urine Culture     Status: None   Collection Time: 11/14/20  4:08 PM   Specimen: Urine, Random  Result Value Ref Range Status   Specimen Description   Final    URINE, RANDOM Performed at [facility], [address], [place], [place]  [address]    Special Requests   Final    NONE Performed at [facility], [address], [place], [place] [address]    Culture   Final    NO GROWTH Performed at [facility], [address] [place], [place] [address]    Report Status 11/15/2020 FINAL  Final  Resp Panel by RT-PCR (  Flu A&B, Covid) Nasopharyngeal Swab     Status: None   Collection Time: 11/16/20 10:30 AM   Specimen: Nasopharyngeal Swab; Nasopharyngeal(NP) swabs in vial transport medium  Result Value Ref Range Status   SARS Coronavirus 2 by RT PCR NEGATIVE NEGATIVE Final    Comment: (NOTE) SARS-CoV-2 target nucleic acids are NOT DETECTED.  The SARS-CoV-2 RNA is generally detectable in upper respiratory specimens during the acute phase of infection. The lowest concentration of SARS-CoV-2 viral copies this assay can detect is 138 copies/mL. A negative result does not preclude SARS-Cov-2 infection and should not be used as the sole basis for treatment or other patient management decisions. A negative result may occur with  improper specimen collection/handling, submission of specimen other than nasopharyngeal swab, presence of viral mutation(s) within the areas targeted by this assay, and inadequate number of viral copies(<138 copies/mL). A negative result must be combined with clinical observations, patient history, and epidemiological information. The expected result is Negative.  Fact Sheet for Patients:  BloggerCourse.com  Fact Sheet for Healthcare Providers:  SeriousBroker.it  This test is no t yet approved or cleared by the [place] FDA and  has been authorized for detection and/or diagnosis of SARS-CoV-2 by FDA under an Emergency Use Authorization (EUA). This EUA will remain  in effect (meaning this test can be used) for the duration of the COVID-19 declaration under Section 564(b)(1) of the Act, 21 U.S.C.section 360bbb-3(b)(1), unless the  authorization is terminated  or revoked sooner.       Influenza A by PCR NEGATIVE NEGATIVE Final   Influenza B by PCR NEGATIVE NEGATIVE Final    Comment: (NOTE) The Xpert Xpress SARS-CoV-2/FLU/RSV plus assay is intended as an aid in the diagnosis of influenza from Nasopharyngeal swab specimens and should not be used as a sole basis for treatment. Nasal washings and aspirates are unacceptable for Xpert Xpress SARS-CoV-2/FLU/RSV testing.  Fact Sheet for Patients: BloggerCourse.com  Fact Sheet for Healthcare Providers: SeriousBroker.it  This test is not yet approved or cleared by the [place] FDA and has been authorized for detection and/or diagnosis of SARS-CoV-2 by FDA under an Emergency Use Authorization (EUA). This EUA will remain in effect (meaning this test can be used) for the duration of the COVID-19 declaration under Section 564(b)(1) of the Act, 21 U.S.C. section 360bbb-3(b)(1), unless the authorization is terminated or revoked.  Performed at [facility], [address], [place], [place] [address]     Please note: You were cared for by a hospitalist during your hospital stay. Once you are discharged, your primary care physician will handle any further medical issues. Please note that NO REFILLS for any discharge medications will be authorized once you are discharged, as it is imperative that you return to your primary care physician (or establish a relationship with a primary care physician if you do not have one) for your post hospital discharge needs so that they can reassess your need for medications and monitor your lab values.    Time coordinating discharge: 40 minutes  SIGNED:   Burnadette Pop, MD  [facility] 11/17/2020, 11:37 AM Pager [phone]  If 7PM-7AM, please contact night-coverage [email or web address] Password [email or web address]

## 2020-11-16 NOTE — Progress Notes (Signed)
   11/16/20 1344  Vitals  BP (!) 200/78  BP Method Automatic  Adm 20 mg labetalol

## 2020-11-16 NOTE — Progress Notes (Signed)
   11/16/20 1553  Vitals  BP (!) 173/88 (PRN)  Prn hydralazine

## 2020-11-16 NOTE — Progress Notes (Signed)
Patient became significantly hypertensive today requiring IV labetalol twice .  Discharge thought to be unsafe for today.  We will continue to monitor blood pressure.  Possible plan for discharge tomorrow.  I tried to call her daughter, phone not received

## 2020-11-16 NOTE — Care Management Important Message (Signed)
Important Message  Patient Details  Name: Katrina Chen MRN: [id number] Date of Birth: 05-05-1935   Medicare Important Message Given:  Other (see comment)  Called Bridgett Larsson, daughter [phone]) to review the Important Message from Medicare but unable to leave her a message as her mailbox was full. Will try again.    [name] A Eloy Fehl 11/16/2020, 12:33 PM

## 2020-11-16 NOTE — Progress Notes (Signed)
   11/16/20 1311  Vitals  BP (!) 200/90  BP Method Manual  Notified MD Renford Dills

## 2020-11-16 NOTE — Care Management Important Message (Signed)
Important Message  Patient Details  Name: Katrina Chen MRN: [id number] Date of Birth: [birth year]   Medicare Important Message Given:  Other (see comment)  Tried calling daughter, Katrina Chen [phone]) again but the voicemail is full.   Katrina Chen 11/16/2020, 1:21 PM

## 2020-11-16 NOTE — Progress Notes (Addendum)
Pt bp 198/71, pt has not voided since straight cath at 0800. Bladder scan 48ml. Notified MD Adhikari MD states pt will still DC and foley will be placed at SNF if needed. Flowmax and one time dose for BP ordered.. see MAR

## 2020-11-16 NOTE — Progress Notes (Signed)
K 4.5 ok to hold patiromer per MD Renford Dills

## 2020-11-16 NOTE — Progress Notes (Signed)
Ok to dc tele monitoring per Dr Adhikari 

## 2020-11-16 NOTE — Progress Notes (Addendum)
   11/16/20 1458  Vitals  BP (!) 180/100  Notidied MD of BP and pt has not voided bladder scan . Asked MD for orders for I and O parameters

## 2020-11-16 NOTE — TOC Progression Note (Signed)
Transition of Care [facility]) - Progression Note    Patient Details  Name: Katrina Chen MRN: [id number] Date of Birth: 26-May-1935  [facility] CM/SW Contact  Allayne Butcher, RN Phone Number: 11/16/2020, 1:48 PM  Clinical Narrative:    Initial plan for discharge back to [facility] today but patient is having elevated blood pressure and urinary retention so discharge canceled.   Potential plan for DC tomorrow if BP better controlled and no issues with urinary retention.     Expected Discharge Plan: Skilled Nursing Facility Barriers to Discharge: Continued Medical Work up  Expected Discharge Plan and Services Expected Discharge Plan: Skilled Nursing Facility   Discharge Planning Services: CM Consult Post Acute Care Choice: Resumption of Svcs/PTA Provider, Skilled Nursing Facility Living arrangements for the past 2 months: Skilled Nursing Facility Expected Discharge Date: 11/16/20               DME Arranged: N/A DME Agency: NA       HH Arranged: NA HH Agency: NA         Social Determinants of Health (SDOH) Interventions    Readmission Risk Interventions No flowsheet data found.

## 2020-11-16 NOTE — Progress Notes (Addendum)
   11/16/20 0741  Urine Characteristics  Bladder Scan Volume (mL) 1032 mL  Night shift nurse report minimum urine output overnight bladder scan pt. Reported to MD Adhikari Verbal order for straight cath

## 2020-11-17 LAB — BASIC METABOLIC PANEL
Anion gap: 8 (ref 5–15)
BUN: 26 mg/dL — ABNORMAL HIGH (ref 8–23)
CO2: 25 mmol/L (ref 22–32)
Calcium: 9.7 mg/dL (ref 8.9–10.3)
Chloride: 103 mmol/L (ref 98–111)
Creatinine, Ser: 1.27 mg/dL — ABNORMAL HIGH (ref 0.44–1.00)
GFR, Estimated: 42 mL/min — ABNORMAL LOW (ref >60)
Glucose, Bld: 111 mg/dL — ABNORMAL HIGH (ref 70–99)
Potassium: 4.6 mmol/L (ref 3.5–5.1)
Sodium: 136 mmol/L (ref 135–145)

## 2020-11-17 NOTE — TOC Transition Note (Signed)
Transition of Care [facility]) - CM/SW Discharge Note   Patient Details  Name: Katrina Chen MRN: [id number] Date of Birth: [birth year]  [facility] CM/SW Contact:  Allayne Butcher, RN Phone Number: 11/17/2020, 1:06 PM   Clinical Narrative:    Patient is medically cleared for discharge back to [facility] today, going to room 106.  Patient's daughter updated on discharge.  Bedside RN will call report and RNCM has arranged EMS.  Unable to provide estimate for EMS pick up.   Final next level of care: Skilled Nursing Facility Barriers to Discharge: Barriers Resolved   Patient Goals and CMS Choice Patient states their goals for this hospitalization and ongoing recovery are:: Patient will return to [facility] CMS Medicare.gov Compare Post Acute Care list provided to:: Patient Represenative (must comment) Choice offered to / list presented to : Adult Children  Discharge Placement              Patient chooses bed at: [facility] [place] Patient to be transferred to facility by: [facility] Name of family member notified: Daughter [name] Patient and family notified of of transfer: 11/17/20  Discharge Plan and Services   Discharge Planning Services: CM Consult Post Acute Care Choice: Resumption of Svcs/PTA Provider, Skilled Nursing Facility          DME Arranged: N/A DME Agency: NA       HH Arranged: NA HH Agency: NA        Social Determinants of Health (SDOH) Interventions     Readmission Risk Interventions No flowsheet data found.

## 2020-11-17 NOTE — Progress Notes (Signed)
Katrina Chen to be D/C'd  per MD order.  Discussed with discharge facility, report called to Gifford Shave LPN, at [facility] and all questions fully answered.  BP elevated upon discharge but was unable to give PRN antihypertensive due to IV being removed for discharge. Contacted provider and he said she was okay to transfer and to continue oral antihypertensives at facility. Skin clean, dry and intact without evidence of skin break down, no evidence of skin tears noted.  IV catheter discontinued intact. Site without signs and symptoms of complications. Dressing and pressure applied.  An After Visit Summary was printed and given to transport to handoff to facility at time of arrival.   D/c education completed with [facility] including follow up instructions, medication list, d/c activities limitations if indicated, with other d/c instructions as indicated by MD - receiving nurse able to verbalize understanding, all questions fully answered.   Patient instructed to return to ED, call 911, or call MD for any changes in condition.   Patient to be escorted via stretcher, and D/C to [facility] via EMS transport.

## 2020-11-17 NOTE — Care Management Important Message (Signed)
Important Message  Patient Details  Name: Katrina Chen MRN: [id number] Date of Birth: [birth year]   Medicare Important Message Given:  Other (see comment)  Tried calling daughter, Bridgett Larsson [phone]) to review the Important Message from [facility] but her mailbox is full.   No family in patients room.  [name] A Harlym Gehling 11/17/2020, 11:49 AM

## 2020-11-17 NOTE — Care Management Important Message (Signed)
Important Message  Patient Details  Name: Katrina Chen MRN: [id number] Date of Birth: [birth year]   Medicare Important Message Given:  Other (see comment)  No family in the patient's room and I tried contacting her daughter, Bridgett Larsson ([phone]) to review the Important Message from [facility] but no answer and unable to leave a message as her mailbox is full. No other contact listed.   [name] A Godwin Tedesco 11/17/2020, 2:04 PM

## 2020-11-17 NOTE — Progress Notes (Signed)
Patient seen and examined at the bedside this morning.  Hemodynamically stable today.  Blood pressures improved.  Looks comfortable, she was discharged yesterday but discharge was canceled because of severe hypertension.  Discharge orders and summary are already in.  Patient is medically stable for discharge today.

## 2020-11-19 LAB — ANA W/REFLEX: Anti Nuclear Antibody (ANA): NEGATIVE

## 2020-11-20 ENCOUNTER — Other Ambulatory Visit: Payer: Self-pay

## 2020-11-20 ENCOUNTER — Emergency Department
Admission: EM | Admit: 2020-11-20 | Discharge: 2020-11-20 | Disposition: A | Discharge status: Home / Self Care | Payer: Medicare Other | Attending: Emergency Medicine | Admitting: Emergency Medicine

## 2020-11-20 ENCOUNTER — Encounter: Payer: Self-pay | Admitting: Emergency Medicine

## 2020-11-20 DIAGNOSIS — I482 Chronic atrial fibrillation, unspecified: Secondary | ICD-10-CM | POA: Diagnosis not present

## 2020-11-20 DIAGNOSIS — Z96653 Presence of artificial knee joint, bilateral: Secondary | ICD-10-CM | POA: Diagnosis not present

## 2020-11-20 DIAGNOSIS — F0150 Vascular dementia without behavioral disturbance: Secondary | ICD-10-CM | POA: Insufficient documentation

## 2020-11-20 DIAGNOSIS — I129 Hypertensive chronic kidney disease with stage 1 through stage 4 chronic kidney disease, or unspecified chronic kidney disease: Secondary | ICD-10-CM | POA: Diagnosis not present

## 2020-11-20 DIAGNOSIS — N183 Chronic kidney disease, stage 3 unspecified: Secondary | ICD-10-CM | POA: Diagnosis not present

## 2020-11-20 DIAGNOSIS — I2510 Atherosclerotic heart disease of native coronary artery without angina pectoris: Secondary | ICD-10-CM | POA: Insufficient documentation

## 2020-11-20 DIAGNOSIS — B9689 Other specified bacterial agents as the cause of diseases classified elsewhere: Secondary | ICD-10-CM | POA: Diagnosis not present

## 2020-11-20 DIAGNOSIS — Z950 Presence of cardiac pacemaker: Secondary | ICD-10-CM | POA: Insufficient documentation

## 2020-11-20 DIAGNOSIS — Z7901 Long term (current) use of anticoagulants: Secondary | ICD-10-CM | POA: Insufficient documentation

## 2020-11-20 DIAGNOSIS — Z79899 Other long term (current) drug therapy: Secondary | ICD-10-CM | POA: Diagnosis not present

## 2020-11-20 DIAGNOSIS — N3001 Acute cystitis with hematuria: Secondary | ICD-10-CM | POA: Insufficient documentation

## 2020-11-20 DIAGNOSIS — R1030 Lower abdominal pain, unspecified: Secondary | ICD-10-CM | POA: Diagnosis present

## 2020-11-20 LAB — URINALYSIS, COMPLETE (UACMP) WITH MICROSCOPIC
Bilirubin Urine: NEGATIVE
Glucose, UA: NEGATIVE mg/dL
Ketones, ur: NEGATIVE mg/dL
Nitrite: POSITIVE — AB
Protein, ur: 30 mg/dL — AB
Specific Gravity, Urine: 1.025 (ref 1.005–1.030)
Squamous Epithelial / HPF: NONE SEEN (ref 0–5)
WBC, UA: >50 WBC/hpf — ABNORMAL HIGH (ref 0–5)
pH: 5.5 (ref 5.0–8.0)

## 2020-11-20 LAB — CBC
HCT: 34.7 % — ABNORMAL LOW (ref 36.0–46.0)
Hemoglobin: 11.1 g/dL — ABNORMAL LOW (ref 12.0–15.0)
MCH: 28.6 pg (ref 26.0–34.0)
MCHC: 32 g/dL (ref 30.0–36.0)
MCV: 89.4 fL (ref 80.0–100.0)
Platelets: 266 10*3/uL (ref 150–400)
RBC: 3.88 MIL/uL (ref 3.87–5.11)
RDW: 13 % (ref 11.5–15.5)
WBC: 6.1 10*3/uL (ref 4.0–10.5)
nRBC: 0 % (ref 0.0–0.2)

## 2020-11-20 LAB — COMPREHENSIVE METABOLIC PANEL WITH GFR
ALT: 12 U/L (ref 0–44)
AST: 18 U/L (ref 15–41)
Albumin: 3.4 g/dL — ABNORMAL LOW (ref 3.5–5.0)
Alkaline Phosphatase: 59 U/L (ref 38–126)
Anion gap: 7 (ref 5–15)
BUN: 38 mg/dL — ABNORMAL HIGH (ref 8–23)
CO2: 26 mmol/L (ref 22–32)
Calcium: 9.7 mg/dL (ref 8.9–10.3)
Chloride: 106 mmol/L (ref 98–111)
Creatinine, Ser: 1.52 mg/dL — ABNORMAL HIGH (ref 0.44–1.00)
GFR, Estimated: 34 mL/min — ABNORMAL LOW
Glucose, Bld: 95 mg/dL (ref 70–99)
Potassium: 4.6 mmol/L (ref 3.5–5.1)
Sodium: 139 mmol/L (ref 135–145)
Total Bilirubin: 0.6 mg/dL (ref 0.3–1.2)
Total Protein: 6.2 g/dL — ABNORMAL LOW (ref 6.5–8.1)

## 2020-11-20 LAB — LIPASE, BLOOD: Lipase: 27 U/L (ref 11–51)

## 2020-11-20 MED ORDER — LORAZEPAM 2 MG/ML IJ SOLN
0.5000 mg | Freq: Once | INTRAMUSCULAR | Status: AC
  Administered 2020-11-20: 0.5 mg via INTRAVENOUS
  Filled 2020-11-20: qty 1

## 2020-11-20 MED ORDER — MORPHINE SULFATE (PF) 4 MG/ML IV SOLN
4.0000 mg | Freq: Once | INTRAVENOUS | Status: AC
  Administered 2020-11-20: 4 mg via INTRAVENOUS
  Filled 2020-11-20: qty 1

## 2020-11-20 MED ORDER — SODIUM CHLORIDE 0.9 % IV BOLUS
500.0000 mL | Freq: Once | INTRAVENOUS | Status: AC
  Administered 2020-11-20: 500 mL via INTRAVENOUS

## 2020-11-20 MED ORDER — SODIUM CHLORIDE 0.9 % IV SOLN
1.0000 g | Freq: Once | INTRAVENOUS | Status: AC
  Administered 2020-11-20: 1 g via INTRAVENOUS
  Filled 2020-11-20: qty 10

## 2020-11-20 MED ORDER — ONDANSETRON HCL 4 MG/2ML IJ SOLN
4.0000 mg | Freq: Once | INTRAMUSCULAR | Status: AC
  Administered 2020-11-20: 4 mg via INTRAVENOUS
  Filled 2020-11-20: qty 2

## 2020-11-20 MED ORDER — CEPHALEXIN 500 MG PO CAPS: 500.0000 mg | ORAL_CAPSULE | Freq: Two times a day (BID) | ORAL | 0 refills | Status: DC

## 2020-11-20 NOTE — ED Notes (Signed)
This RN attempted to call [facility] to give report about pt's DC, no answer at this time.  

## 2020-11-20 NOTE — ED Notes (Signed)
This RN attempted to call [facility] to give report about pt's DC, no answer at this time.

## 2020-11-20 NOTE — ED Provider Notes (Signed)
[facility] Emergency Department Provider Note   ____________________________________________    I have reviewed the triage vital signs and the nursing notes.   HISTORY  Chief Complaint Abdominal Pain and Dysuria  History limited, patient is poor historian   HPI Katrina Chen is a 85 y.o. female with history as noted below who presents with reported complaints of lower abdominal pain.  Patient does have indwelling Foley catheter.  No reports of fevers or chills, no nausea or vomiting, complains of suprapubic pain which apparently started today.  Review of medical records demonstrates the patient has been admitted several times in the last 3 weeks for different issues.  Past Medical History:  Diagnosis Date   A-fib [facility])    Hyperlipidemia    Hypertension    Iron deficiency    Vascular dementia [facility])     Patient Active Problem List   Diagnosis Date Noted   Spinal stenosis of lumbar region 11/12/2020   Hyponatremia 11/12/2020   Chest pain 11/05/2020   CAD (coronary artery disease) 11/05/2020   Hyperkalemia 11/05/2020   Obstipation 11/05/2020   History of bipolar disorder 11/05/2020   Atrial fibrillation, chronic (HCC) 11/05/2020   CKD (chronic kidney disease), stage III (HCC) 11/05/2020   Frail elderly 11/05/2020   Weakness 11/05/2020   Protein-calorie malnutrition (HCC) 11/05/2020   Status post total bilateral knee replacement 05/27/2013   Essential hypertension 04/22/2013    Past Surgical History:  Procedure Laterality Date   PACEMAKER IMPLANT      Prior to Admission medications   Medication Sig Start Date End Date Taking? Authorizing Provider  cephALEXin (KEFLEX) 500 MG capsule Take 1 capsule (500 mg total) by mouth 2 (two) times daily. 11/20/20  Yes Jene Every, MD  bisacodyl (DULCOLAX) 10 MG suppository Place 1 suppository (10 mg total) rectally daily as needed for mild constipation. 11/08/20   Tresa Moore, MD  carvedilol  (COREG) 6.25 MG tablet Take 12.5 mg by mouth 2 (two) times daily. 11/05/20   [provider]  celecoxib (CELEBREX) 100 MG capsule Take 1 capsule (100 mg total) by mouth 2 (two) times daily as needed. 11/16/20   Burnadette Pop, MD  cetirizine (ZYRTEC) 5 MG tablet Take 5 mg by mouth daily.    [provider]  cloNIDine (CATAPRES) 0.1 MG tablet Take 0.1 mg by mouth 2 (two) times daily. 11/04/20   [provider]  DULoxetine (CYMBALTA) 60 MG capsule Take 60 mg by mouth 2 (two) times daily. 10/26/20   [provider]  ELIQUIS 2.5 MG TABS tablet Take 2.5 mg by mouth 2 (two) times daily. 11/03/20   [provider]  gabapentin (NEURONTIN) 100 MG capsule Take 100 mg by mouth daily. 11/03/20   [provider]  losartan (COZAAR) 100 MG tablet Take 100 mg by mouth daily. 10/18/20   [provider]  melatonin 5 MG TABS Take 10 mg by mouth at bedtime.    [provider]  OLANZapine (ZYPREXA) 2.5 MG tablet Take 2.5 mg by mouth at bedtime. 10/28/20   [provider]  olopatadine (PATANOL) 0.1 % ophthalmic solution Place 1 drop into both eyes 2 (two) times daily.    [provider]  polyethylene glycol (MIRALAX / GLYCOLAX) 17 g packet Take 17 g by mouth 2 (two) times daily. 11/16/20   Burnadette Pop, MD  senna-docusate (SENOKOT-S) 8.6-50 MG tablet Take 1 tablet by mouth 2 (two) times daily. 11/08/20   Tresa Moore, MD  sodium  phosphate (FLEET) 7-19 GM/118ML ENEM Place 133 mLs (1 enema total) rectally daily as needed for severe constipation. 11/08/20   Tresa Moore, MD  tamsulosin (FLOMAX) 0.4 MG CAPS capsule Take 1 capsule (0.4 mg total) by mouth daily. 11/16/20   Burnadette Pop, MD  traMADol (ULTRAM) 50 MG tablet Take 50 mg by mouth every 6 (six) hours as needed for pain. 09/23/20   [provider]     Allergies Iodine, Amlodipine, Nifedipine, Lisinopril, Quinapril, Shellfish allergy, and Simvastatin  No family  history on file.  Social History Social History   Tobacco Use   Smoking status: Never   Smokeless tobacco: Never  Substance Use Topics   Alcohol use: Never   Drug use: Never    Review of Systems severely limited  Constitutional: No fever  Cardiovascular: Denies chest pain. Respiratory: No cough Gastrointestinal: As above Genitourinary: Foley catheter  Skin: Negative for rash.    ____________________________________________   PHYSICAL EXAM:  VITAL SIGNS: ED Triage Vitals  Enc Vitals Group     BP 11/20/20 1108 138/68     Pulse Rate 11/20/20 1108 (!) 58     Resp 11/20/20 1108 20     Temp 11/20/20 1108 97.8 F (36.6 C)     Temp Source 11/20/20 1108 Oral     SpO2 11/20/20 1108 99 %     Weight 11/20/20 1104 86 kg (189 lb 9.5 oz)     Height 11/20/20 1104 1.702 m (5\' 7" )     Head Circumference --      [facility] --      Pain Score 11/20/20 1104 10     Pain Loc --      Pain Edu? --      Excl. in GC? --     Constitutional: Alert, anxious Eyes: Conjunctivae are normal.  Head: Atraumatic. Nose: No congestion/rhinnorhea. Mouth/Throat: Mucous membranes are moist.    Cardiovascular: Normal rate, regular rhythm.   Good peripheral circulation. Respiratory: Normal respiratory effort.  No retractions.  Gastrointestinal: Soft and nontender. No distention.    Musculoskeletal: No lower extremity tenderness nor edema.  Warm and well perfused Neurologic:  No gross focal neurologic deficits are appreciated.  Skin:  Skin is warm, dry and intact. No rash noted. Psychiatric: Mood and affect are normal. Speech and behavior are normal.  ____________________________________________   LABS (all labs ordered are listed, but only abnormal results are displayed)  Labs Reviewed  COMPREHENSIVE METABOLIC PANEL - Abnormal; Notable for the following components:      Result Value   BUN 38 (*)    Creatinine, Ser 1.52 (*)    Total Protein 6.2 (*)    Albumin 3.4 (*)    GFR, Estimated  34 (*)    All other components within normal limits  CBC - Abnormal; Notable for the following components:   Hemoglobin 11.1 (*)    HCT 34.7 (*)    All other components within normal limits  URINALYSIS, COMPLETE (UACMP) WITH MICROSCOPIC - Abnormal; Notable for the following components:   Hgb urine dipstick LARGE (*)    Protein, ur 30 (*)    Nitrite POSITIVE (*)    Leukocytes,Ua SMALL (*)    WBC, UA >50 (*)    Bacteria, UA MANY (*)    All other components within normal limits  URINE CULTURE  LIPASE, BLOOD   ____________________________________________  EKG  ED ECG REPORT I, 01/20/21, the attending physician, personally viewed and interpreted this ECG.  Date: 11/20/2020  Rhythm: Atrial fibrillation QRS Axis: normal Intervals: Abnormal ST/T Wave abnormalities: Nonspecific changes Narrative Interpretation: no evidence of acute ischemia  ____________________________________________  RADIOLOGY   ____________________________________________   PROCEDURES  Procedure(s) performed: No  Procedures   Critical Care performed: No ____________________________________________   INITIAL IMPRESSION / ASSESSMENT AND PLAN / ED COURSE  Pertinent labs & imaging results that were available during my care of the patient were reviewed by me and considered in my medical decision making (see chart for details).   Patient presents with suprapubic pain as detailed above, suspicious for UTI given Foley catheter.  Lab work demonstrates normal white blood cell count mild elevation of BUN/creatinine, IV fluids infusing  Urinalysis consistent with UTI, IV Rocephin ordered.     ----------------------------------------- 1:57 PM on 11/20/2020 ----------------------------------------- Patient improved after IV pain medications and low-dose IV Ativan.  She is requesting food now.  No indication for admission as she is quite comfortable, will Rx antibiotic, outpatient follow-up, doubt she  will tolerate changing Foley at this time    ____________________________________________   FINAL CLINICAL IMPRESSION(S) / ED DIAGNOSES  Final diagnoses:  Acute cystitis with hematuria        Note:  This document was prepared using Dragon voice recognition software and may include unintentional dictation errors.    Jene Every, MD 11/20/20 1357

## 2020-11-20 NOTE — ED Notes (Signed)
Pt spitting small amounts of clear bubbly saliva intermittently.  Daughter states that this is new as of today. Daughter at bedside.

## 2020-11-20 NOTE — ED Triage Notes (Signed)
Pt in via EMS from [facility]. Facility reports pt with severe abd pain and vaginal pain at the site of the foley insertion. EMS reports strong urine odor. Pt crying in triage. #18 to left FA. Pt was given of fentanyl with some improvement.

## 2020-11-22 ENCOUNTER — Ambulatory Visit: Payer: Medicare Other | Admitting: Urology

## 2020-11-22 ENCOUNTER — Ambulatory Visit: Payer: Medicare Other | Admitting: Physician Assistant

## 2020-11-22 LAB — URINE CULTURE: Culture: >=100000 — AB

## 2020-11-29 ENCOUNTER — Ambulatory Visit (INDEPENDENT_AMBULATORY_CARE_PROVIDER_SITE_OTHER): Payer: Medicare Other | Admitting: Physician Assistant

## 2020-11-29 ENCOUNTER — Other Ambulatory Visit: Payer: Self-pay

## 2020-11-29 ENCOUNTER — Ambulatory Visit: Payer: Medicare Other | Admitting: Physician Assistant

## 2020-11-29 DIAGNOSIS — R339 Retention of urine, unspecified: Secondary | ICD-10-CM

## 2020-11-29 DIAGNOSIS — N3001 Acute cystitis with hematuria: Secondary | ICD-10-CM

## 2020-11-29 LAB — BLADDER SCAN AMB NON-IMAGING

## 2020-11-29 NOTE — Progress Notes (Signed)
11/29/2020 9:59 AM   Katrina Chen 10/28/1935 [id number]  CC: Chief Complaint  Patient presents with   Urinary Retention   HPI: Katrina Chen is a 85 y.o. female with PMH spinal stenosis, CKD, A. fib on Eliquis, and vascular dementia with multiple recent hospitalizations who presents today for voiding trial. Majority of history provided by her daughter, who accompanies her to clinic today. She is currently residing at [facility].   She was first admitted from 11/05/2020 to 11/08/2020 with obstipation.  She was readmitted from 11/12/2020 to 11/17/2020 with hyponatremia.  During this hospitalization, she was found to have urinary retention and Foley catheter was placed.  She subsequently presented to the emergency department on 11/20/2020 with reports of suprapubic pain.  UA was notable for nitrites, 21-50 RBCs/hpf, >50 WBCs/hpf, and many bacteria. She was treated with 1 g IV Rocephin and discharged with Keflex 500 mg twice daily x10 days; her urine culture finalized with ampicillin resistant and Macrobid intermediate Klebsiella pneumonia.  Foley catheter was not exchanged at the time of her ED visit.  She remains on Keflex today.  On review of her recent imaging, she had a rather distended urinary bladder on CTAP dated 11/05/2020 from her first recent hospitalization.  No Foley catheter was placed.  By comparison, CTAP without contrast dated 11/12/2020 showed interval improvement in her bladder distention. Per chart review, bladder scan on 11/16/2020 showed .  Today she reports no acute concerns. Her suprapubic pain has resolved on antibiotics. She denies a history of urinary retention. She has never seen a urologist before. She continues to have some constipation.  Foley catheter removed in clinic today, see note below for details. Notably, she required lift for transfer. Patient returned to clinic in the afternoon for PVR. She reports drinking 16oz of fluid. She has been able to urinate. PVR  39mL.  PMH: Past Medical History:  Diagnosis Date   A-fib (HCC)    Hyperlipidemia    Hypertension    Iron deficiency    Vascular dementia [facility])     Surgical History: Past Surgical History:  Procedure Laterality Date   PACEMAKER IMPLANT      Home Medications:  Allergies as of 11/29/2020       Reactions   Iodine Swelling   Amlodipine Other (See Comments)   Nifedipine Swelling, Other (See Comments)   Lisinopril Swelling   Quinapril Swelling   Shellfish Allergy    Simvastatin Swelling        Medication List        Accurate as of November 29, 2020  9:59 AM. If you have any questions, ask your nurse or doctor.          bisacodyl 10 MG suppository Commonly known as: DULCOLAX Place 1 suppository (10 mg total) rectally daily as needed for mild constipation.   carvedilol 6.25 MG tablet Commonly known as: COREG Take 12.5 mg by mouth 2 (two) times daily.   celecoxib 100 MG capsule Commonly known as: CELEBREX Take 1 capsule (100 mg total) by mouth 2 (two) times daily as needed.   cephALEXin 500 MG capsule Commonly known as: KEFLEX Take 1 capsule (500 mg total) by mouth 2 (two) times daily.   cetirizine 5 MG tablet Commonly known as: ZYRTEC Take 5 mg by mouth daily.   cloNIDine 0.1 MG tablet Commonly known as: CATAPRES Take 0.1 mg by mouth 2 (two) times daily.   DULoxetine 60 MG capsule Commonly known as: CYMBALTA Take 60 mg by mouth 2 (  two) times daily.   Eliquis 2.5 MG Tabs tablet Generic drug: apixaban Take 2.5 mg by mouth 2 (two) times daily.   gabapentin 100 MG capsule Commonly known as: NEURONTIN Take 100 mg by mouth daily.   losartan 100 MG tablet Commonly known as: COZAAR Take 100 mg by mouth daily.   melatonin 5 MG Tabs Take 10 mg by mouth at bedtime.   OLANZapine 2.5 MG tablet Commonly known as: ZYPREXA Take 2.5 mg by mouth at bedtime.   olopatadine 0.1 % ophthalmic solution Commonly known as: PATANOL Place 1 drop into both eyes  2 (two) times daily.   polyethylene glycol 17 g packet Commonly known as: MIRALAX / GLYCOLAX Take 17 g by mouth 2 (two) times daily.   senna-docusate 8.6-50 MG tablet Commonly known as: Senokot-S Take 1 tablet by mouth 2 (two) times daily.   sodium phosphate 7-19 GM/118ML Enem Place 133 mLs (1 enema total) rectally daily as needed for severe constipation.   tamsulosin 0.4 MG Caps capsule Commonly known as: Flomax Take 1 capsule (0.4 mg total) by mouth daily.   traMADol 50 MG tablet Commonly known as: ULTRAM Take 50 mg by mouth every 6 (six) hours as needed for pain.        Allergies:  Allergies  Allergen Reactions   Iodine Swelling   Amlodipine Other (See Comments)   Nifedipine Swelling and Other (See Comments)   Lisinopril Swelling   Quinapril Swelling   Shellfish Allergy    Simvastatin Swelling    Family History: No family history on file.  Social History:   reports that she has never smoked. She has never used smokeless tobacco. She reports that she does not drink alcohol and does not use drugs.  Physical Exam: There were no vitals taken for this visit.  Constitutional:  Alert and oriented, no acute distress, nontoxic appearing HEENT: [place], AT Cardiovascular: No clubbing, cyanosis, or edema Respiratory: Normal respiratory effort, no increased work of breathing Skin: No rashes, bruises or suspicious lesions Neurologic: Grossly intact, no focal deficits, moving all 4 extremities Psychiatric: Normal mood and affect  Laboratory Data: Results for orders placed or performed in visit on 11/29/20  BLADDER SCAN AMB NON-IMAGING  Result Value Ref Range   Scan Result 57mL    Catheter Removal  Patient is present today for a catheter removal.  26ml of water was drained from the balloon. A 14FR silicone foley cath was removed from the bladder no complications were noted . Patient tolerated well.  Performed by: Carman Ching, PA-C, Franchot Erichsen, CMA, Milas Kocher, CMA, and Gerarda Gunther, CMA  Assessment & Plan:   1. Urinary retention Likely secondary to constipation.  Patient passed her voiding trial today, no further intervention indicated.  I offered the patient follow-up as needed versus scheduled follow-up in 4 to 6 weeks for repeat bladder scan.  Given her poor functional status, daughter elected for the former, which is reasonable.  Counseled them extensively to return to clinic if she develops difficulty urinating, lower abdominal pain, lower abdominal distention, or dysuria.  They expressed understanding. - BLADDER SCAN AMB NON-IMAGING  2. Acute cystitis with hematuria Treated with culture appropriate antibiotics per the ED.  Return if symptoms worsen or fail to improve.  Carman Ching, PA-C  [facility] [address], [address] [place], [place] [address] [phone]

## 2020-12-01 ENCOUNTER — Other Ambulatory Visit: Payer: Self-pay

## 2020-12-01 ENCOUNTER — Non-Acute Institutional Stay: Payer: Medicare Other | Admitting: Primary Care

## 2020-12-01 DIAGNOSIS — Z515 Encounter for palliative care: Secondary | ICD-10-CM

## 2020-12-01 DIAGNOSIS — R54 Age-related physical debility: Secondary | ICD-10-CM

## 2020-12-01 DIAGNOSIS — K5900 Constipation, unspecified: Secondary | ICD-10-CM

## 2020-12-01 DIAGNOSIS — M48061 Spinal stenosis, lumbar region without neurogenic claudication: Secondary | ICD-10-CM

## 2020-12-01 NOTE — Progress Notes (Signed)
[facility] Consult Note Telephone: [phone]  Fax: [phone]   Date of encounter: 12/01/20 8:09 PM PATIENT NAME: Katrina Chen [address] [place] [address]   [phone] (home)  DOB: [birth year] MRN: [id number] PRIMARY CARE PROVIDER:    Cephas Darby, [address] [place] [address] [phone]  REFERRING PROVIDER:   Cephas Darby, [address] [place] [address] [phone]   RESPONSIBLE PARTY:    Contact Information     Name Relation Home Work Mobile   Kimball,Cynthia Daughter   [phone]       I [facility]  in [facility]. Palliative Care was asked to follow this patient by consultation request of  Cephas Darby, FNP  to address advance care planning and complex medical decision making. This is the initial visit.                                     ASSESSMENT AND PLAN / RECOMMENDATIONS:   Advance Care Planning/Goals of Care: Goals include to maximize quality of life and symptom management.Our advance care planning conversation included a discussion about:    Unable to reach POA, MOST noted In record with choices documented CODE STATUS: DNR, MOST with comfort measures only  Symptom Management/Plan:  Patient resting in bed and appears comfortable. Able to converse although difficult to understand due to dry mouth and some confusion. Patient's POA not able to be reached. Staff endorses patient was declining in intake for a while but seems to have started eating by herself again. Her weights have remained largely stable.  She denies discomfort.  She states her daughter is Katrina Chen and helps her with health decisions. I will continue to reach out to meet with family. Noted goals are comfort only.   Follow up Palliative Care Visit: Palliative care will continue to follow for complex medical decision making, advance care planning, and clarification of goals. Return 4-6 weeks  or prn.  I spent 35 minutes providing this consultation. More than 50% of the time in this consultation was spent in counseling and care coordination.  PPS: 30%  HOSPICE ELIGIBILITY/DIAGNOSIS: TBD  Chief Complaint: debility  HISTORY OF PRESENT ILLNESS:  Katrina Chen is a 85 y.o. year old female  with weakness, malnutrition, debility, dementia.   History obtained from review of EMR, discussion with primary team, and interview with family, facility staff/caregiver and/or [name].  I reviewed available labs, medications, imaging, studies and related documents from the EMR.  Records reviewed and summarized above.   ROS/staff  General: NAD ENMT: endorses dysphagia Cardiovascular: denies chest pain, denies DOE Pulmonary: denies cough, denies increased SOB Abdomen: endorses good appetite, denies constipation, endorses incontinence of bowel GU: denies dysuria, endorses incontinence of urine MSK: endorses  weakness,  no falls reported Skin: denies rashes or wounds Neurological: denies pain, denies insomnia Psych: Endorses positive mood Heme/lymph/immuno: denies bruises, abnormal bleeding  Physical Exam: Current and past weights: 187 lbs, stable Constitutional: NAD General: frail appearing EYES: anicteric sclera, lids intact, no discharge  ENMT: intact hearing, oral mucous membranes moist, edentulous CV:  no LE edema Pulmonary: no increased work of breathing, no cough, room air Abdomen: intake 50-75%, no ascites GU: deferred MSK:  + sarcopenia, moves all extremities,  non ambulatory Skin: warm and dry, no rashes or wounds on visible skin Neuro:  ++generalized weakness,  ++  cognitive impairment Psych: non-anxious affect, A and O x 1 ]Hem/lymph/immuno: no widespread bruising CURRENT PROBLEM LIST:  Patient Active Problem List   Diagnosis Date Noted   Spinal stenosis of lumbar region 11/12/2020   Hyponatremia 11/12/2020   Chest pain 11/05/2020   CAD (coronary artery disease)  11/05/2020   Hyperkalemia 11/05/2020   Obstipation 11/05/2020   History of bipolar disorder 11/05/2020   Atrial fibrillation, chronic (HCC) 11/05/2020   CKD (chronic kidney disease), stage III (HCC) 11/05/2020   Frail elderly 11/05/2020   Weakness 11/05/2020   Protein-calorie malnutrition (HCC) 11/05/2020   Status post total bilateral knee replacement 05/27/2013   Essential hypertension 04/22/2013   PAST MEDICAL HISTORY:  Active Ambulatory Problems    Diagnosis Date Noted   Chest pain 11/05/2020   CAD (coronary artery disease) 11/05/2020   Hyperkalemia 11/05/2020   Obstipation 11/05/2020   History of bipolar disorder 11/05/2020   Atrial fibrillation, chronic (HCC) 11/05/2020   CKD (chronic kidney disease), stage III (HCC) 11/05/2020   Frail elderly 11/05/2020   Weakness 11/05/2020   Protein-calorie malnutrition (HCC) 11/05/2020   Essential hypertension 04/22/2013   Spinal stenosis of lumbar region 11/12/2020   Status post total bilateral knee replacement 05/27/2013   Hyponatremia 11/12/2020   Resolved Ambulatory Problems    Diagnosis Date Noted   No Resolved Ambulatory Problems   Past Medical History:  Diagnosis Date   A-fib (HCC)    Hyperlipidemia    Hypertension    Iron deficiency    Vascular dementia (HCC)    SOCIAL HX:  Social History   Tobacco Use   Smoking status: Never   Smokeless tobacco: Never  Substance Use Topics   Alcohol use: Never   FAMILY HX: No further family history attainable from patient or on chart review; no family present.    ALLERGIES:  Allergies  Allergen Reactions   Iodine Swelling   Amlodipine Other (See Comments)   Nifedipine Swelling and Other (See Comments)   Lisinopril Swelling   Quinapril Swelling   Shellfish Allergy    Simvastatin Swelling     PERTINENT MEDICATIONS:  Outpatient Encounter Medications as of 12/01/2020  Medication Sig   bisacodyl (DULCOLAX) 10 MG suppository Place 1 suppository (10 mg total) rectally  daily as needed for mild constipation.   carvedilol (COREG) 6.25 MG tablet Take 12.5 mg by mouth 2 (two) times daily.   celecoxib (CELEBREX) 100 MG capsule Take 1 capsule (100 mg total) by mouth 2 (two) times daily as needed.   cephALEXin (KEFLEX) 500 MG capsule Take 1 capsule (500 mg total) by mouth 2 (two) times daily.   cetirizine (ZYRTEC) 5 MG tablet Take 5 mg by mouth daily.   cloNIDine (CATAPRES) 0.1 MG tablet Take 0.1 mg by mouth 2 (two) times daily.   DULoxetine (CYMBALTA) 60 MG capsule Take 60 mg by mouth 2 (two) times daily.   ELIQUIS 2.5 MG TABS tablet Take 2.5 mg by mouth 2 (two) times daily.   gabapentin (NEURONTIN) 100 MG capsule Take 100 mg by mouth daily.   losartan (COZAAR) 100 MG tablet Take 100 mg by mouth daily.   melatonin 5 MG TABS Take 10 mg by mouth at bedtime.   OLANZapine (ZYPREXA) 2.5 MG tablet Take 2.5 mg by mouth at bedtime.   olopatadine (PATANOL) 0.1 % ophthalmic solution Place 1 drop into both eyes 2 (two) times daily.   polyethylene glycol (MIRALAX / GLYCOLAX) 17 g packet Take 17 g by mouth 2 (two) times daily.  senna-docusate (SENOKOT-S) 8.6-50 MG tablet Take 1 tablet by mouth 2 (two) times daily.   sodium phosphate (FLEET) 7-19 GM/118ML ENEM Place 133 mLs (1 enema total) rectally daily as needed for severe constipation.   tamsulosin (FLOMAX) 0.4 MG CAPS capsule Take 1 capsule (0.4 mg total) by mouth daily.   traMADol (ULTRAM) 50 MG tablet Take 50 mg by mouth every 6 (six) hours as needed for pain.   No facility-administered encounter medications on file as of 12/01/2020.   Thank you for the opportunity to participate in the care of Ms. Pemble.  The palliative care team will continue to follow. Please call our office at [phone] if we can be of additional assistance.   Jason Coop, NP , DNP, AGPCNP-BC  COVID-19 PATIENT SCREENING TOOL Asked and negative response unless otherwise noted:  Have you had symptoms of covid, tested positive or been  in contact with someone with symptoms/positive test in the past 5-10 days?

## 2021-01-19 ENCOUNTER — Non-Acute Institutional Stay: Payer: Medicare Other | Admitting: Primary Care

## 2021-01-19 ENCOUNTER — Other Ambulatory Visit: Payer: Self-pay

## 2021-01-19 DIAGNOSIS — Z515 Encounter for palliative care: Secondary | ICD-10-CM

## 2021-01-19 DIAGNOSIS — K5900 Constipation, unspecified: Secondary | ICD-10-CM

## 2021-01-19 DIAGNOSIS — M48061 Spinal stenosis, lumbar region without neurogenic claudication: Secondary | ICD-10-CM

## 2021-01-19 NOTE — Progress Notes (Signed)
[facility] Consult Note Telephone: [phone]  Fax: [phone]    Date of encounter: 01/19/21 3:59 PM PATIENT NAME: Katrina Chen [address] Katrina Chen [address]   [phone] (home)  DOB: 10-Sep-1935 MRN: [id number] PRIMARY CARE PROVIDER:    Cephas Darby, [place],  [address] [place] [address] [phone]  REFERRING PROVIDER:   Cephas Darby, [address] [place],  [place] [address] [phone]  RESPONSIBLE PARTY:    Contact Information     Name Relation Home Work Mobile   Chen,Katrina Daughter   [phone]       I met face to face [facility]  in [facility]. Palliative Care was asked to follow this patient by consultation request of  Cephas Darby, FNP to address advance care planning and complex medical decision making. This is a follow up visit.                                   ASSESSMENT AND PLAN / RECOMMENDATIONS:   Advance Care Planning/Goals of Care: Goals include to maximize quality of life and symptom management. Our advance care planning conversation included a discussion about:     Exploration of personal, cultural or spiritual beliefs that might influence medical decisions  Exploration of goals of care in the event of a sudden injury or illness  Identification of a healthcare agent  Daughter Katrina Chen Attempted to phone POA, Katrina Chen. Called number on file, no answer, no ability to leave message.   Symptom Management/Plan:  Pain: Patient endorses pain, mainly in her back. She states she does not like to take tylenol and cannot take aspirin for her heart. Already taking celebrex. May benefit from restorative PT.  Mobility: Needs to have help with position changes. Up in chair today awaiting lunch.   Follow up Palliative Care Visit: Palliative care will continue to follow for complex medical decision making, advance care planning, and clarification of goals. Return 6-8 weeks or prn.  I  spent 15 minutes providing this consultation. More than 50% of the time in this consultation was spent in counseling and care coordination.  PPS: 40%  HOSPICE ELIGIBILITY/DIAGNOSIS: no  Chief Complaint: pain  HISTORY OF PRESENT ILLNESS:  Katrina Chen is a 85 y.o. year old female  with spinal stenosis, CAD, chronic pain .   History obtained from review of EMR, discussion with primary team, and interview with family, facility staff/caregiver and/or Ms. Hutzler.  I reviewed available labs, medications, imaging, studies and related documents from the EMR.  Records reviewed and summarized above.   ROS   General: NAD EYES: denies vision changes, has glasses ENMT: denies dysphagia Cardiovascular: denies chest pain, denies DOE Pulmonary: denies cough, denies increased SOB Abdomen: endorses good appetite, denies constipation, endorses continence of bowel GU: denies dysuria, endorses continence of urine MSK:  endorses weakness,  no falls reported Skin: denies rashes or wounds Neurological: endorses pain, denies insomnia Psych: Endorses positive mood Heme/lymph/immuno: denies bruises, abnormal bleeding  Physical Exam: Current and past weights: stable  Constitutional: NAD General: frail appearing EYES: anicteric sclera, lids intact, no discharge  ENMT: intact hearing, oral mucous membranes moist, dentition intact CV: S1S2, RRR, slight bil  LE edema Pulmonary: LCTA, no increased work of breathing, no cough, room air MSK: mod sarcopenia, moves all extremities, non ambulatory Skin: warm and dry, no rashes or wounds on visible skin Neuro:  +  generalized weakness,  mild cognitive impairment Psych: non-anxious affect, A and O x 2-3 Hem/lymph/immuno: no widespread bruising  Outpatient Encounter Medications as of 01/19/2021  Medication Sig   bisacodyl (DULCOLAX) 10 MG suppository Place 1 suppository (10 mg total) rectally daily as needed for mild constipation.   carvedilol (COREG) 6.25 MG tablet  Take 12.5 mg by mouth 2 (two) times daily.   celecoxib (CELEBREX) 100 MG capsule Take 1 capsule (100 mg total) by mouth 2 (two) times daily as needed.   cephALEXin (KEFLEX) 500 MG capsule Take 1 capsule (500 mg total) by mouth 2 (two) times daily.   cetirizine (ZYRTEC) 5 MG tablet Take 5 mg by mouth daily.   cloNIDine (CATAPRES) 0.1 MG tablet Take 0.1 mg by mouth 2 (two) times daily.   DULoxetine (CYMBALTA) 60 MG capsule Take 60 mg by mouth 2 (two) times daily.   ELIQUIS 2.5 MG TABS tablet Take 2.5 mg by mouth 2 (two) times daily.   gabapentin (NEURONTIN) 100 MG capsule Take 100 mg by mouth daily.   losartan (COZAAR) 100 MG tablet Take 100 mg by mouth daily.   melatonin 5 MG TABS Take 10 mg by mouth at bedtime.   OLANZapine (ZYPREXA) 2.5 MG tablet Take 2.5 mg by mouth at bedtime.   olopatadine (PATANOL) 0.1 % ophthalmic solution Place 1 drop into both eyes 2 (two) times daily.   polyethylene glycol (MIRALAX / GLYCOLAX) 17 g packet Take 17 g by mouth 2 (two) times daily.   senna-docusate (SENOKOT-S) 8.6-50 MG tablet Take 1 tablet by mouth 2 (two) times daily.   sodium phosphate (FLEET) 7-19 GM/118ML ENEM Place 133 mLs (1 enema total) rectally daily as needed for severe constipation.   tamsulosin (FLOMAX) 0.4 MG CAPS capsule Take 1 capsule (0.4 mg total) by mouth daily.   traMADol (ULTRAM) 50 MG tablet Take 50 mg by mouth every 6 (six) hours as needed for pain.   No facility-administered encounter medications on file as of 01/19/2021.     Thank you for the opportunity to participate in the care of Ms. Katrina Chen.  The palliative care team will continue to follow. Please call our office at [phone] if we can be of additional assistance.   Jason Coop, NP DNP, AGPCNP-BC  COVID-19 PATIENT SCREENING TOOL Asked and negative response unless otherwise noted:   Have you had symptoms of covid, tested positive or been in contact with someone with symptoms/positive test in the past 5-10  days?

## 2021-03-11 ENCOUNTER — Non-Acute Institutional Stay: Payer: Medicare Other | Admitting: Primary Care

## 2021-03-11 ENCOUNTER — Other Ambulatory Visit: Payer: Self-pay

## 2021-03-11 VITALS — Ht 67.0 in | Wt 181.0 lb

## 2021-03-11 DIAGNOSIS — R54 Age-related physical debility: Secondary | ICD-10-CM

## 2021-03-11 DIAGNOSIS — M48061 Spinal stenosis, lumbar region without neurogenic claudication: Secondary | ICD-10-CM

## 2021-03-11 NOTE — Progress Notes (Signed)
Therapist, [facility] Palliative Care Consult Note Telephone: [phone]  Fax: [phone]    Date of encounter: 03/11/21 11:44 AM PATIENT NAME: Katrina Chen [address] [place] [address]   [phone] (home)  DOB: [birth year] MRN: [id number] PRIMARY CARE PROVIDER:    Wardell Honour, FNP,  [address] [place] [address] [phone]  REFERRING PROVIDER:   Wardell Honour, FNP [address] [place],  [place] [address] [phone]  RESPONSIBLE PARTY:    Contact Information     Name Relation Home Work Mobile   Kimball,Cynthia Daughter   [phone]        I met face to face with [facility] in [facility]. Palliative Care was asked to follow this patient by consultation request of  Wardell Honour, FNP to address advance care planning and complex medical decision making. This is a follow up visit.                                   ASSESSMENT AND PLAN / RECOMMENDATIONS:   Advance Care Planning/Goals of Care: Goals include to maximize quality of life and symptom management. Our advance care planning conversation included a discussion about:     Exploration of personal, cultural or spiritual beliefs that might influence medical decisions  CODE STATUS: DNR  Symptom Management/Plan:  I saw patient in her nursing home room. She is sitting up watching television. She has made improvements and said she feels good. She is more interactive and oriented than on my  previous visits. She states she's eating well and enjoyed an outing with her family at Christmas. She talks of her family locally and from [place]. She States that her appetite is good. Staff do not report any current issues.  Mood is good. Medications reviewed. Will continue to assess for palliative needs.  Follow up Palliative Care Visit: Palliative care will continue to follow for complex medical decision making, advance care planning, and clarification of goals. Return 12 weeks or prn.  I  spent 15 minutes providing this consultation. More than 50% of the time in this consultation was spent in counseling and care coordination.  PPS: 40%  HOSPICE ELIGIBILITY/DIAGNOSIS: TBD  Chief Complaint: debility  HISTORY OF PRESENT ILLNESS:  Katrina Chen is a 85 y.o. year old female  with generalized weakness, bipolar disorder, schizophrenia, vascular dementia, chronic pain, bradycardia s/p pacer, hypertension, hyperlipidemia, and atrial fibrillation on apixaban.    History obtained from review of EMR, discussion with primary team, and interview with family, facility staff/caregiver and/or Katrina Chen.  I reviewed available labs, medications, imaging, studies and related documents from the EMR.  Records reviewed and summarized above.   ROS   General: NAD EYES: denies vision changes, uses glasses ENMT: denies dysphagia Cardiovascular: denies chest pain, denies DOE Pulmonary: denies cough, denies increased SOB Abdomen: endorses good appetite, denies constipation, endorses continence of bowel GU: denies dysuria, endorses continence of urine MSK:  denies  new weakness,  no falls reported Skin: denies rashes or wounds Neurological: denies pain, denies insomnia Psych: Endorses positive mood Heme/lymph/immuno: denies bruises, abnormal bleeding  Physical Exam: Current and past weights: 181 lbs, 6 lb loss in 3 months, Body mass index is 28.35 kg/m. Constitutional: NAD General: frail appearing,  wnWD EYES: anicteric sclera, lids intact, no discharge  ENMT: intact hearing, oral mucous membranes moist CV: no LE edema Pulmonary: no increased work of breathing, no cough, room  air Abdomen: intake 75-100%, no ascites GU: deferred MSK: no sarcopenia, moves all extremities, non ambulatory, transfer with help Skin: warm and dry, no rashes or wounds on visible skin Neuro:  + generalized weakness,  + cognitive impairment Psych: non-anxious affect, A and O x 2-3 Hem/lymph/immuno: no widespread  bruising  Outpatient Encounter Medications as of 03/11/2021  Medication Sig   bisacodyl (DULCOLAX) 10 MG suppository Place 1 suppository (10 mg total) rectally daily as needed for mild constipation.   carvedilol (COREG) 6.25 MG tablet Take 12.5 mg by mouth 2 (two) times daily.   celecoxib (CELEBREX) 100 MG capsule Take 1 capsule (100 mg total) by mouth 2 (two) times daily as needed.   cloNIDine (CATAPRES) 0.1 MG tablet Take 0.1 mg by mouth 2 (two) times daily.   cycloSPORINE (RESTASIS) 0.05 % ophthalmic emulsion Apply 1 drop to eye in the morning and at bedtime.  X 90 days from 12/21/20 End 03/23/21.   divalproex (DEPAKOTE SPRINKLE) 125 MG capsule Take 125 mg by mouth 2 (two) times daily.   ELIQUIS 2.5 MG TABS tablet Take 2.5 mg by mouth 2 (two) times daily.   gabapentin (NEURONTIN) 100 MG capsule Take 100 mg by mouth daily.   losartan (COZAAR) 100 MG tablet Take 100 mg by mouth daily.   melatonin 5 MG TABS Take 10 mg by mouth at bedtime.   MOVANTIK 25 MG TABS tablet Take 25 mg by mouth daily.   OLANZapine (ZYPREXA) 2.5 MG tablet Take 2.5 mg by mouth at bedtime.   olopatadine (PATANOL) 0.1 % ophthalmic solution Place 1 drop into both eyes 2 (two) times daily.   polyethylene glycol (MIRALAX / GLYCOLAX) 17 g packet Take 17 g by mouth 2 (two) times daily.   senna-docusate (SENOKOT-S) 8.6-50 MG tablet Take 1 tablet by mouth 2 (two) times daily.   sodium phosphate (FLEET) 7-19 GM/118ML ENEM Place 133 mLs (1 enema total) rectally daily as needed for severe constipation.   tamsulosin (FLOMAX) 0.4 MG CAPS capsule Take 1 capsule (0.4 mg total) by mouth daily.   traMADol (ULTRAM) 50 MG tablet Take 50 mg by mouth every 6 (six) hours as needed for pain.   venlafaxine XR (EFFEXOR-XR) 37.5 MG 24 hr capsule Take 37.5 mg by mouth daily.   cephALEXin (KEFLEX) 500 MG capsule Take 1 capsule (500 mg total) by mouth 2 (two) times daily. (Patient not taking: Reported on 03/11/2021)   cetirizine (ZYRTEC) 5 MG tablet  Take 5 mg by mouth daily. (Patient not taking: Reported on 03/11/2021)   DULoxetine (CYMBALTA) 60 MG capsule Take 60 mg by mouth 2 (two) times daily. (Patient not taking: Reported on 03/11/2021)   No facility-administered encounter medications on file as of 03/11/2021.    Thank you for the opportunity to participate in the care of Ms. Thul.  The palliative care team will continue to follow. Please call our office at [phone] if we can be of additional assistance.   Jason Coop, NP DNP, AGPCNP-BC  COVID-19 PATIENT SCREENING TOOL Asked and negative response unless otherwise noted:   Have you had symptoms of covid, tested positive or been in contact with someone with symptoms/positive test in the past 5-10 days?

## 2021-06-16 ENCOUNTER — Inpatient Hospital Stay
Admission: EM | Admit: 2021-06-16 | Discharge: 2021-06-20 | DRG: 690 | Disposition: A | Discharge status: Skilled Nursing Facility | Payer: Medicare Other | Attending: Internal Medicine | Admitting: Internal Medicine

## 2021-06-16 ENCOUNTER — Emergency Department: Payer: Medicare Other

## 2021-06-16 ENCOUNTER — Other Ambulatory Visit: Payer: Self-pay

## 2021-06-16 ENCOUNTER — Encounter: Payer: Self-pay | Admitting: Physician Assistant

## 2021-06-16 DIAGNOSIS — N1832 Chronic kidney disease, stage 3b: Secondary | ICD-10-CM

## 2021-06-16 DIAGNOSIS — I2510 Atherosclerotic heart disease of native coronary artery without angina pectoris: Secondary | ICD-10-CM | POA: Diagnosis present

## 2021-06-16 DIAGNOSIS — Z23 Encounter for immunization: Secondary | ICD-10-CM | POA: Diagnosis present

## 2021-06-16 DIAGNOSIS — I129 Hypertensive chronic kidney disease with stage 1 through stage 4 chronic kidney disease, or unspecified chronic kidney disease: Secondary | ICD-10-CM | POA: Diagnosis present

## 2021-06-16 DIAGNOSIS — J90 Pleural effusion, not elsewhere classified: Secondary | ICD-10-CM

## 2021-06-16 DIAGNOSIS — E875 Hyperkalemia: Secondary | ICD-10-CM | POA: Diagnosis present

## 2021-06-16 DIAGNOSIS — Z950 Presence of cardiac pacemaker: Secondary | ICD-10-CM | POA: Diagnosis not present

## 2021-06-16 DIAGNOSIS — R5381 Other malaise: Secondary | ICD-10-CM | POA: Diagnosis present

## 2021-06-16 DIAGNOSIS — N3000 Acute cystitis without hematuria: Secondary | ICD-10-CM | POA: Diagnosis not present

## 2021-06-16 DIAGNOSIS — B961 Klebsiella pneumoniae [K. pneumoniae] as the cause of diseases classified elsewhere: Secondary | ICD-10-CM | POA: Diagnosis present

## 2021-06-16 DIAGNOSIS — R079 Chest pain, unspecified: Secondary | ICD-10-CM | POA: Diagnosis not present

## 2021-06-16 DIAGNOSIS — Z91013 Allergy to seafood: Secondary | ICD-10-CM

## 2021-06-16 DIAGNOSIS — F0153 Vascular dementia, unspecified severity, with mood disturbance: Secondary | ICD-10-CM | POA: Diagnosis present

## 2021-06-16 DIAGNOSIS — E46 Unspecified protein-calorie malnutrition: Secondary | ICD-10-CM | POA: Diagnosis not present

## 2021-06-16 DIAGNOSIS — N390 Urinary tract infection, site not specified: Principal | ICD-10-CM | POA: Diagnosis present

## 2021-06-16 DIAGNOSIS — Z888 Allergy status to other drugs, medicaments and biological substances status: Secondary | ICD-10-CM | POA: Diagnosis not present

## 2021-06-16 DIAGNOSIS — E441 Mild protein-calorie malnutrition: Secondary | ICD-10-CM | POA: Diagnosis present

## 2021-06-16 DIAGNOSIS — Z6832 Body mass index (BMI) 32.0-32.9, adult: Secondary | ICD-10-CM | POA: Diagnosis not present

## 2021-06-16 DIAGNOSIS — Z20822 Contact with and (suspected) exposure to covid-19: Secondary | ICD-10-CM | POA: Diagnosis present

## 2021-06-16 DIAGNOSIS — I482 Chronic atrial fibrillation, unspecified: Secondary | ICD-10-CM

## 2021-06-16 DIAGNOSIS — R531 Weakness: Secondary | ICD-10-CM

## 2021-06-16 DIAGNOSIS — I10 Essential (primary) hypertension: Secondary | ICD-10-CM

## 2021-06-16 DIAGNOSIS — I2583 Coronary atherosclerosis due to lipid rich plaque: Secondary | ICD-10-CM

## 2021-06-16 DIAGNOSIS — Z66 Do not resuscitate: Secondary | ICD-10-CM | POA: Diagnosis present

## 2021-06-16 DIAGNOSIS — E785 Hyperlipidemia, unspecified: Secondary | ICD-10-CM | POA: Diagnosis present

## 2021-06-16 DIAGNOSIS — N179 Acute kidney failure, unspecified: Secondary | ICD-10-CM | POA: Diagnosis present

## 2021-06-16 DIAGNOSIS — Z8659 Personal history of other mental and behavioral disorders: Secondary | ICD-10-CM | POA: Diagnosis not present

## 2021-06-16 DIAGNOSIS — N183 Chronic kidney disease, stage 3 unspecified: Secondary | ICD-10-CM | POA: Diagnosis present

## 2021-06-16 DIAGNOSIS — R1310 Dysphagia, unspecified: Secondary | ICD-10-CM | POA: Diagnosis present

## 2021-06-16 DIAGNOSIS — G8929 Other chronic pain: Secondary | ICD-10-CM | POA: Diagnosis present

## 2021-06-16 DIAGNOSIS — Z7901 Long term (current) use of anticoagulants: Secondary | ICD-10-CM

## 2021-06-16 DIAGNOSIS — Z79899 Other long term (current) drug therapy: Secondary | ICD-10-CM

## 2021-06-16 DIAGNOSIS — D631 Anemia in chronic kidney disease: Secondary | ICD-10-CM | POA: Diagnosis present

## 2021-06-16 LAB — HEPATIC FUNCTION PANEL
ALT: 13 U/L (ref 0–44)
AST: 29 U/L (ref 15–41)
Albumin: 3.2 g/dL — ABNORMAL LOW (ref 3.5–5.0)
Alkaline Phosphatase: 84 U/L (ref 38–126)
Bilirubin, Direct: 0.2 mg/dL (ref 0.0–0.2)
Indirect Bilirubin: 0.4 mg/dL (ref 0.3–0.9)
Total Bilirubin: 0.6 mg/dL (ref 0.3–1.2)
Total Protein: 7.3 g/dL (ref 6.5–8.1)

## 2021-06-16 LAB — BASIC METABOLIC PANEL
Anion gap: 9 (ref 5–15)
BUN: 20 mg/dL (ref 8–23)
CO2: 27 mmol/L (ref 22–32)
Calcium: 9.3 mg/dL (ref 8.9–10.3)
Chloride: 99 mmol/L (ref 98–111)
Creatinine, Ser: 1.33 mg/dL — ABNORMAL HIGH (ref 0.44–1.00)
GFR, Estimated: 39 mL/min — ABNORMAL LOW (ref >60)
Glucose, Bld: 73 mg/dL (ref 70–99)
Potassium: 5.2 mmol/L — ABNORMAL HIGH (ref 3.5–5.1)
Sodium: 135 mmol/L (ref 135–145)

## 2021-06-16 LAB — URINALYSIS, COMPLETE (UACMP) WITH MICROSCOPIC
Bilirubin Urine: NEGATIVE
Glucose, UA: NEGATIVE mg/dL
Ketones, ur: NEGATIVE mg/dL
Nitrite: NEGATIVE
Protein, ur: NEGATIVE mg/dL
Specific Gravity, Urine: 1.003 — ABNORMAL LOW (ref 1.005–1.030)
WBC, UA: >50 WBC/hpf — ABNORMAL HIGH (ref 0–5)
pH: 7 (ref 5.0–8.0)

## 2021-06-16 LAB — CBC
HCT: 37.9 % (ref 36.0–46.0)
Hemoglobin: 10.9 g/dL — ABNORMAL LOW (ref 12.0–15.0)
MCH: 22.8 pg — ABNORMAL LOW (ref 26.0–34.0)
MCHC: 28.8 g/dL — ABNORMAL LOW (ref 30.0–36.0)
MCV: 79.1 fL — ABNORMAL LOW (ref 80.0–100.0)
Platelets: 258 10*3/uL (ref 150–400)
RBC: 4.79 MIL/uL (ref 3.87–5.11)
RDW: 15.6 % — ABNORMAL HIGH (ref 11.5–15.5)
WBC: 5.2 10*3/uL (ref 4.0–10.5)
nRBC: 0 % (ref 0.0–0.2)

## 2021-06-16 LAB — TROPONIN I (HIGH SENSITIVITY)
Troponin I (High Sensitivity): 12 ng/L (ref <18)
Troponin I (High Sensitivity): 9 ng/L (ref <18)

## 2021-06-16 LAB — CK: Total CK: 132 U/L (ref 38–234)

## 2021-06-16 LAB — RESP PANEL BY RT-PCR (FLU A&B, COVID) ARPGX2
Influenza A by PCR: NEGATIVE
Influenza B by PCR: NEGATIVE
SARS Coronavirus 2 by RT PCR: NEGATIVE

## 2021-06-16 MED ORDER — CYCLOSPORINE 0.05 % OP EMUL
1.0000 [drp] | Freq: Two times a day (BID) | OPHTHALMIC | Status: DC
  Administered 2021-06-16 – 2021-06-20 (×8): 1 [drp] via OPHTHALMIC
  Filled 2021-06-16 (×8): qty 30

## 2021-06-16 MED ORDER — CLONIDINE HCL 0.2 MG/24HR TD PTWK
0.2000 mg | MEDICATED_PATCH | TRANSDERMAL | Status: DC
  Administered 2021-06-16: 0.2 mg via TRANSDERMAL
  Filled 2021-06-16: qty 1

## 2021-06-16 MED ORDER — PRAZOSIN HCL 1 MG PO CAPS
1.0000 mg | ORAL_CAPSULE | Freq: Every day | ORAL | Status: DC
  Administered 2021-06-16 – 2021-06-19 (×4): 1 mg via ORAL
  Filled 2021-06-16 (×4): qty 1

## 2021-06-16 MED ORDER — GABAPENTIN 100 MG PO CAPS
100.0000 mg | ORAL_CAPSULE | Freq: Three times a day (TID) | ORAL | Status: DC
  Administered 2021-06-16 – 2021-06-20 (×12): 100 mg via ORAL
  Filled 2021-06-16 (×12): qty 1

## 2021-06-16 MED ORDER — FUROSEMIDE 10 MG/ML IJ SOLN
40.0000 mg | Freq: Two times a day (BID) | INTRAMUSCULAR | Status: DC
  Administered 2021-06-16: 40 mg via INTRAVENOUS
  Filled 2021-06-16 (×2): qty 4

## 2021-06-16 MED ORDER — SODIUM CHLORIDE 0.9 % IV BOLUS
500.0000 mL | Freq: Once | INTRAVENOUS | Status: DC
Start: 2021-06-16 — End: 2021-06-16

## 2021-06-16 MED ORDER — TAMSULOSIN HCL 0.4 MG PO CAPS
0.4000 mg | ORAL_CAPSULE | Freq: Every day | ORAL | Status: DC
  Administered 2021-06-16 – 2021-06-20 (×5): 0.4 mg via ORAL
  Filled 2021-06-16 (×5): qty 1

## 2021-06-16 MED ORDER — MORPHINE SULFATE (PF) 4 MG/ML IV SOLN
4.0000 mg | Freq: Once | INTRAVENOUS | Status: AC
  Administered 2021-06-16: 4 mg via INTRAVENOUS
  Filled 2021-06-16: qty 1

## 2021-06-16 MED ORDER — LOSARTAN POTASSIUM 50 MG PO TABS
100.0000 mg | ORAL_TABLET | Freq: Every day | ORAL | Status: DC
  Administered 2021-06-17: 100 mg via ORAL
  Filled 2021-06-16 (×2): qty 2

## 2021-06-16 MED ORDER — DIVALPROEX SODIUM 125 MG PO CSDR
125.0000 mg | DELAYED_RELEASE_CAPSULE | Freq: Two times a day (BID) | ORAL | Status: DC
  Administered 2021-06-16 – 2021-06-20 (×8): 125 mg via ORAL
  Filled 2021-06-16 (×8): qty 1

## 2021-06-16 MED ORDER — SODIUM CHLORIDE 0.9 % IV SOLN
1.0000 g | INTRAVENOUS | Status: DC
  Administered 2021-06-17: 1 g via INTRAVENOUS
  Filled 2021-06-16: qty 10
  Filled 2021-06-16: qty 1

## 2021-06-16 MED ORDER — CARVEDILOL 25 MG PO TABS
25.0000 mg | ORAL_TABLET | Freq: Two times a day (BID) | ORAL | Status: DC
  Administered 2021-06-16 – 2021-06-20 (×7): 25 mg via ORAL
  Filled 2021-06-16 (×8): qty 1

## 2021-06-16 MED ORDER — OLOPATADINE HCL 0.1 % OP SOLN
1.0000 [drp] | Freq: Two times a day (BID) | OPHTHALMIC | Status: DC
  Administered 2021-06-17 – 2021-06-20 (×8): 1 [drp] via OPHTHALMIC
  Filled 2021-06-16 (×2): qty 5

## 2021-06-16 MED ORDER — ENOXAPARIN SODIUM 40 MG/0.4ML IJ SOSY: 40.0000 mg | PREFILLED_SYRINGE | INTRAMUSCULAR | Status: DC

## 2021-06-16 MED ORDER — FLUTICASONE PROPIONATE 50 MCG/ACT NA SUSP
1.0000 | Freq: Every day | NASAL | Status: DC
  Administered 2021-06-18: 1 via NASAL

## 2021-06-16 MED ORDER — LIDOCAINE 5 % EX PTCH
1.0000 | MEDICATED_PATCH | CUTANEOUS | Status: DC
  Administered 2021-06-16 – 2021-06-19 (×4): 1 via TRANSDERMAL
  Filled 2021-06-16 (×4): qty 1

## 2021-06-16 MED ORDER — APIXABAN 2.5 MG PO TABS
2.5000 mg | ORAL_TABLET | Freq: Two times a day (BID) | ORAL | Status: DC
  Administered 2021-06-16 – 2021-06-20 (×8): 2.5 mg via ORAL
  Filled 2021-06-16 (×8): qty 1

## 2021-06-16 MED ORDER — VENLAFAXINE HCL ER 75 MG PO CP24
75.0000 mg | ORAL_CAPSULE | Freq: Every day | ORAL | Status: DC
  Administered 2021-06-17 – 2021-06-20 (×4): 75 mg via ORAL
  Filled 2021-06-16 (×4): qty 1

## 2021-06-16 MED ORDER — MELATONIN 5 MG PO TABS
10.0000 mg | ORAL_TABLET | Freq: Every day | ORAL | Status: DC
  Administered 2021-06-16 – 2021-06-19 (×4): 10 mg via ORAL
  Filled 2021-06-16 (×4): qty 2

## 2021-06-16 MED ORDER — CETIRIZINE HCL 10 MG PO TABS
5.0000 mg | ORAL_TABLET | Freq: Every day | ORAL | Status: DC
  Administered 2021-06-16 – 2021-06-20 (×5): 5 mg via ORAL
  Filled 2021-06-16 (×5): qty 1

## 2021-06-16 MED ORDER — POLYETHYLENE GLYCOL 3350 17 G PO PACK
17.0000 g | PACK | Freq: Two times a day (BID) | ORAL | Status: DC
  Administered 2021-06-16 – 2021-06-18 (×4): 17 g via ORAL
  Filled 2021-06-16 (×6): qty 1

## 2021-06-16 MED ORDER — TRAMADOL HCL 50 MG PO TABS
50.0000 mg | ORAL_TABLET | Freq: Four times a day (QID) | ORAL | Status: DC | PRN
  Administered 2021-06-16 – 2021-06-20 (×9): 50 mg via ORAL
  Filled 2021-06-16 (×10): qty 1

## 2021-06-16 MED ORDER — HYDRALAZINE HCL 10 MG PO TABS
10.0000 mg | ORAL_TABLET | Freq: Three times a day (TID) | ORAL | Status: DC
  Administered 2021-06-16 – 2021-06-20 (×10): 10 mg via ORAL
  Filled 2021-06-16 (×15): qty 1

## 2021-06-16 MED ORDER — OLANZAPINE 2.5 MG PO TABS
2.5000 mg | ORAL_TABLET | Freq: Every day | ORAL | Status: DC
  Administered 2021-06-17 – 2021-06-19 (×4): 2.5 mg via ORAL
  Filled 2021-06-16 (×4): qty 1

## 2021-06-16 MED ORDER — CELECOXIB 100 MG PO CAPS
100.0000 mg | ORAL_CAPSULE | Freq: Two times a day (BID) | ORAL | Status: DC | PRN
  Filled 2021-06-16: qty 1

## 2021-06-16 MED ORDER — CEFTRIAXONE SODIUM 1 G IJ SOLR
1.0000 g | Freq: Once | INTRAMUSCULAR | Status: AC
  Administered 2021-06-16: 1 g via INTRAVENOUS
  Filled 2021-06-16: qty 10

## 2021-06-16 MED ORDER — ONDANSETRON HCL 4 MG/2ML IJ SOLN
4.0000 mg | Freq: Once | INTRAMUSCULAR | Status: AC
  Administered 2021-06-16: 4 mg via INTRAVENOUS
  Filled 2021-06-16: qty 2

## 2021-06-16 MED ORDER — SENNOSIDES-DOCUSATE SODIUM 8.6-50 MG PO TABS
1.0000 | ORAL_TABLET | Freq: Two times a day (BID) | ORAL | Status: DC
  Administered 2021-06-16 – 2021-06-20 (×5): 1 via ORAL
  Filled 2021-06-16 (×6): qty 1

## 2021-06-16 NOTE — ED Triage Notes (Signed)
Pt here via [facility] with generalized pain all over from her head to her feet. Pt states she has rods in her back but this pain has been bothering her for a while. Pt denies N/V/D.  ?

## 2021-06-16 NOTE — Assessment & Plan Note (Signed)
Likely d/t deconditioning/debility, exacerbated by UTI ?Ambulatory dysfunction ?--> PT/OT ?--> consider dietician consult  ?

## 2021-06-16 NOTE — ED Provider Notes (Signed)
? ?[facility] ?Provider Note ? ? ? Event Date/Time  ? First MD Initiated Contact with Patient 06/16/21 1108   ?  (approximate) ? ? ?History  ? ?Generalized Body Aches ? ? ?HPI ? ?Katrina Chen is a 86 y.o. female who lives at [facility] and has history of hypertension, A-fib, hyperlipidemia, vascular dementia and chronic back pain presents with chest pain, weakness, inability to stand on her own.  Daughter states she does have dementia but is usually very clear.  She does not seem to be more altered than normal.  No known fever.  No vomiting or diarrhea. ? ?  ? ? ?Physical Exam  ? ?Triage Vital Signs: ?ED Triage Vitals [06/16/21 1101]  ?Enc Vitals Group  ?   BP (!) 166/79  ?   Pulse Rate 62  ?   Resp 18  ?   Temp 98.4 ?F (36.9 ?C)  ?   Temp Source Oral  ?   SpO2 100 %  ?   Weight 210 lb (95.3 kg)  ?   Height 5\' 7"  (1.702 m)  ?   Head Circumference   ?   [facility]   ?   Pain Score 10  ?   Pain Loc   ?   Pain Edu?   ?   Excl. in GC?   ? ? ?Most recent vital signs: ?Vitals:  ? 06/16/21 1145 06/16/21 1300  ?BP: (!) 155/78 (!) 141/63  ?Pulse: 69 62  ?Resp: 17 16  ?Temp:    ?SpO2: 100% 93%  ? ? ? ?General: Awake, no distress.  Patient does appear to be weak, has difficulty pulling herself up for me to listen to her lungs ?CV:  Good peripheral perfusion. regular rate and  rhythm ?Resp:  Normal effort. Lungs some decreased air movement, but the patient is also holding her breath as she is trying to pull herself forward ?Abd:  No distention.   ?Other:    ? ? ?ED Results / Procedures / Treatments  ? ?Labs ?(all labs ordered are listed, but only abnormal results are displayed) ?Labs Reviewed  ?BASIC METABOLIC PANEL - Abnormal; Notable for the following components:  ?    Result Value  ? Potassium 5.2 (*)   ? Creatinine, Ser 1.33 (*)   ? GFR, Estimated 39 (*)   ? All other components within normal limits  ?CBC - Abnormal; Notable for the following components:  ? Hemoglobin 10.9 (*)   ? MCV 79.1 (*)   ?  MCH 22.8 (*)   ? MCHC 28.8 (*)   ? RDW 15.6 (*)   ? All other components within normal limits  ?HEPATIC FUNCTION PANEL - Abnormal; Notable for the following components:  ? Albumin 3.2 (*)   ? All other components within normal limits  ?URINALYSIS, COMPLETE (UACMP) WITH MICROSCOPIC - Abnormal; Notable for the following components:  ? Color, Urine YELLOW (*)   ? APPearance HAZY (*)   ? Specific Gravity, Urine 1.003 (*)   ? Hgb urine dipstick SMALL (*)   ? Leukocytes,Ua LARGE (*)   ? WBC, UA >50 (*)   ? Bacteria, UA MANY (*)   ? All other components within normal limits  ?RESP PANEL BY RT-PCR (FLU A&B, COVID) ARPGX2  ?URINE CULTURE  ?CK  ?TROPONIN I (HIGH SENSITIVITY)  ?TROPONIN I (HIGH SENSITIVITY)  ? ? ? ?EKG ? ?EKG ? ? ?RADIOLOGY ?Chest x-ray ? ? ? ?PROCEDURES: ? ? ?Procedures ? ? ?MEDICATIONS ORDERED  IN ED: ?Medications  ?morphine (PF) 4 MG/ML injection 4 mg (4 mg Intravenous Given 06/16/21 1236)  ?ondansetron [facility]) injection 4 mg (4 mg Intravenous Given 06/16/21 1236)  ?cefTRIAXone (ROCEPHIN) 1 g in sodium chloride 0.9 % 100 mL IVPB (1 g Intravenous New Bag/Given 06/16/21 1310)  ? ? ? ?IMPRESSION / MDM / ASSESSMENT AND PLAN / ED COURSE  ?I reviewed the triage vital signs and the nursing notes. ?             ?               ? ?Differential diagnosis includes, but is not limited to, MI, NSTEMI, CHF, CAP, UTI, sepsis ? ?Patient's labs show a hemoglobin of 10.9 which appears to be in the patient's normal trend, troponin is normal, will await second troponin prior to determining cardiac involvement, basic metabolic panel does have a increased potassium 5.2, her creatinine is 1.33 and her GFR is less than 39.  Urinalysis does show a large amount of leuks, greater than 50 WBCs and many bacteria.  We will start antibiotics due to the UTI.  Will await second troponin to determine cardiac involvement. ? ?EKG shows atrial fibrillation, no change from previous EKG, see physician read ? ?Chest x-ray was independently reviewed by  me.  Read as atelectasis and pleural effusion. ? ?Do not feel that the pleural effusion is large enough for anyone to try to drain.  This may be due to her chronic kidney disease. ? ?Due to the weakness and patient's inability to stand on her own along with UTI will consult hospitalist for admission. ? ?Respiratory panel is negative ? ?Second troponin is normal. ? ?Consult hospitalist.  Dr. Amanda Pea admitting the patient.  She is in stable condition at this time.  Family notified ? ? ?  ? ? ?FINAL CLINICAL IMPRESSION(S) / ED DIAGNOSES  ? ?Final diagnoses:  ?Weakness  ?Nonspecific chest pain  ?Acute UTI  ? ? ? ?Rx / DC Orders  ? ?ED Discharge Orders   ? ? None  ? ?  ? ? ? ?Note:  This document was prepared using Dragon voice recognition software and may include unintentional dictation errors. ? ?  ?Faythe Ghee, PA-C ?06/16/21 1402 ? ?  ?Gilles Chiquito, MD ?06/16/21 1518 ? ?

## 2021-06-16 NOTE — Assessment & Plan Note (Signed)
Stable  ?--> am BMP ?

## 2021-06-16 NOTE — Assessment & Plan Note (Signed)
Not meeting criteria for sepsis but infection may be contributing to generalized weakness in frail patient ?--> follow urine culture ?--> continue antibiotics w/  ?

## 2021-06-16 NOTE — Assessment & Plan Note (Signed)
Stable on current medications ?--> continue home depakote, zyprexa, effexor ?

## 2021-06-16 NOTE — ED Notes (Addendum)
First nurse note: pt comes ems from [facility] with pain all over. States it started a week ago. Is on pain meds and has been getting them regularly without relief. Stated some chest pain too. Reproducible with palpation. VSS.  ? ?Paperwork from facility states DNR but did not provide one for EMS.  ?

## 2021-06-16 NOTE — ED Notes (Signed)
Transport requested

## 2021-06-16 NOTE — Assessment & Plan Note (Addendum)
-->   echocardiogram  ?--> strict I & O ?--> lasix 40 mg iv ordered for 3 doses (pending echo results and repeat exam)  ?--> repeat CXR in AM ?

## 2021-06-16 NOTE — Assessment & Plan Note (Addendum)
-->   continue Eliquis, beta blocker  ?

## 2021-06-16 NOTE — H&P (Signed)
? ? ?HISTORY AND PHYSICAL ? ?Patient: Katrina Chen 86 y.o. female ?MRN: [id number] ? ?Today is hospital day 0 after presenting to ED on 06/16/2021 11:04 AM with  ?Chief Complaint  ?Patient presents with  ? Generalized Body Aches  ? ? ? ?RECORD REVIEW AND HOSPITAL COURSE: ?Patient presents to emergency department today, 06/16/2021, chief complaint of generalized body aches and vague chest pain, generalized weakness, and inability to stand/ambulate on her own.  History of dementia, daughter notes no increased confusion/altered mental status.  Patient denies fever, vomiting, diarrhea.  Reports may be some increased urinary frequency but not sure how long this has been going on. ?ED course 06/16/2021: Afebrile, vitals normal other than slight elevated blood pressure, normal WBC.  Stable anemia with hemoglobin 10.9, stable creatinine/CKD 3 with creatinine 1.33 and GFR 39, potassium slightly elevated at 5.2.  Urinalysis demonstrated large leukocytes, greater than 50 K WBC.  Urine culture pending.  Troponins normal x2, no concerns on EKG other than A-fib, chest x-ray showed increased cardiac size and right pleural effusion. ? ?Procedures:  ?none ? ?Consultants:  ?none ? ? ? ?SUBJECTIVE:  ?Patient seen and examined resting comfortably in bed in emergency department, no apparent distress.  She is alert and talkative, oriented to person/place and situation.  Daughter is present at bedside.  Patient states overall body aches are feeling a bit better since she has been in the emergency department.  Daughter is requesting lighted Derm patch for patient's leg, chronic pain due to accident/injury in childhood. ? ? ? ? ?ASSESSMENT & PLAN ? ?UTI (urinary tract infection) ?Not meeting criteria for sepsis but infection may be contributing to generalized weakness in frail patient ?--> follow urine culture ?--> continue antibiotics w/  ? ?General weakness ?Likely d/t deconditioning/debility, exacerbated by UTI ?Ambulatory dysfunction ?-->  PT/OT ?--> consider dietician consult  ? ?Atrial fibrillation, chronic (HCC) ?--> continue Eliquis, beta blocker  ? ?CKD (chronic kidney disease), stage III ([place]) ?Stable  ?--> am BMP ? ?Hyperkalemia ?Mild ?--> follow am BMP ? ?History of bipolar disorder ?Stable on current medications ?--> continue home depakote, zyprexa, effexor ? ?Pleural effusion w/ enlarged cardiac silhouette on CXR, trace edema ?--> echocardiogram  ?--> strict I & O ?--> lasix 40 mg iv ordered for 3 doses (pending echo results and repeat exam)  ?--> repeat CXR in AM ? ? ?VTE Ppx: continue home eliquis  ?CODE STATUS: DNR ?Admitted from: SNF ?Expected Dispo: SNF, hopefully can go in the next 1-2 days. Prognosis fair given debility and malnutrition.  ?Barriers to discharge: PT evaluation, monitor labs in AM, echocardiogram ?Family communication: daughter at bedside  ? ? ? ? ? ? ? ? ? ? ? ? ? ?Past Medical History:  ?Diagnosis Date  ? A-fib ([place])   ? Hyperlipidemia   ? Hypertension   ? Iron deficiency   ? Vascular dementia ([place])   ? ? ?Past Surgical History:  ?Procedure Laterality Date  ? PACEMAKER IMPLANT    ? ? ?History reviewed. No pertinent family history. ?Social History:  reports that she has never smoked. She has never used smokeless tobacco. She reports that she does not drink alcohol and does not use drugs. ? ?Allergies:  ?Allergies  ?Allergen Reactions  ? Iodine Swelling  ? Amlodipine Other (See Comments)  ? Nifedipine Swelling and Other (See Comments)  ? Lisinopril Swelling  ? Quinapril Swelling  ? Shellfish Allergy   ? Simvastatin Swelling  ? ? ?Meds per last PCP note 03/23/2021 no changes noted  in A/P ?Outpatient Medications Prior to Visit  ?Medication Sig Dispense Refill  ? acetaminophen (TYLENOL) 325 MG tablet Take 650 mg by mouth every 8 (eight) hours  ? apixaban (ELIQUIS) 2.5 mg tablet Take 1 tablet (2.5 mg total) by mouth 2 (two) times daily Resume your apixaban the evening of 05/25/17. 60 tablet 11  ? bisacodyL (DULCOLAX) 10 mg  suppository Place 10 mg rectally once daily as needed for Constipation  ? carvediloL (COREG) 3.125 MG tablet Take 6.25 mg by mouth 2 (two) times daily with meals  ? celecoxib (CELEBREX) 100 MG capsule Take 100 mg by mouth 2 (two) times daily as needed for Pain  ? cetirizine (ZYRTEC) 10 MG tablet Take 10 mg by mouth once daily  ? cloNIDine HCL (CATAPRES) 0.1 MG tablet Take 0.1 mg by mouth 2 (two) times daily  ? cycloSPORINE (RESTASIS) 0.05 % ophthalmic emulsion Place 1 drop into both eyes 2 (two) times daily  ? dextran 70-hypromellose (TEARS NATURALE) ophthalmic solution 1 drop every 4 (four) hours as needed for Dry Eyes  ? divalproex (DEPAKOTE SPRINKLE) 125 mg DR capsule Take by mouth 2 (two) times daily  ? gabapentin (NEURONTIN) 100 MG capsule Take 100 mg by mouth once daily  ? losartan (COZAAR) 100 MG tablet  ? melatonin 12 mg Tab Take by mouth once daily  ? naloxegoL (MOVANTIK) tablet Take by mouth  ? OLANZapine (ZYPREXA) 5 MG tablet Take 1 tablet (5 mg total) by mouth nightly  ? olopatadine (PATANOL) 0.1 % ophthalmic solution Place 1 drop into both eyes 2 (two) times daily  ? sennosides (SENOKOT) 8.6 mg tablet Take 1 tablet by mouth 2 (two) times daily as needed for Constipation  ? tamsulosin (FLOMAX) 0.4 mg capsule Take 0.4 mg by mouth once daily Take 30 minutes after same meal each day.  ? traMADol (ULTRAM) 50 mg tablet Take 50 mg by mouth 4 (four) times daily  ? venlafaxine (EFFEXOR-XR) 37.5 MG XR capsule Take 37.5 mg by mouth once daily  ? calcium carbonate 500 mg calcium (1,250 mg) chewable tablet Take 500 mg of elemental by mouth every 4 (four) hours (Patient not taking: Reported on 03/23/2021)  ? diclofenac (VOLTAREN) 0.1 % ophthalmic solution 1 drop 4 (four) times daily  ? diclofenac (VOLTAREN) 1 % topical gel Apply 2 g topically 4 (four) times daily (Patient not taking: Reported on 03/23/2021)  ? docusate (COLACE) 100 MG capsule Take 100 mg by mouth once daily (Patient not taking: Reported on 03/23/2021)   ? DULoxetine (CYMBALTA) 60 MG DR capsule 2 (two) times daily (Patient not taking: Reported on 03/23/2021)  ? ergocalciferol, vitamin D2, 1,250 mcg (50,000 unit) capsule Take 50,000 Units by mouth once a week (Patient not taking: Reported on 03/23/2021)  ? lidocaine (LIDODERM) 5 % patch (Patient not taking: Reported on 03/23/2021)  ? loperamide (IMODIUM A-D) 2 mg tablet Take 2 mg by mouth 4 (four) times daily as needed for Diarrhea (Patient not taking: Reported on 03/23/2021)   ? ?Results for orders placed or performed during the hospital encounter of 06/16/21 (from the past 48 hour(s))  ?Urinalysis, Complete w Microscopic     Status: Abnormal  ? Collection Time: 06/16/21 11:22 AM  ?Result Value Ref Range  ? Color, Urine YELLOW (A) YELLOW  ? APPearance HAZY (A) CLEAR  ? Specific Gravity, Urine 1.003 (L) 1.005 - 1.030  ? pH 7.0 5.0 - 8.0  ? Glucose, UA NEGATIVE NEGATIVE mg/dL  ? Hgb urine dipstick SMALL (A) NEGATIVE  ?  Bilirubin Urine NEGATIVE NEGATIVE  ? Ketones, ur NEGATIVE NEGATIVE mg/dL  ? Protein, ur NEGATIVE NEGATIVE mg/dL  ? Nitrite NEGATIVE NEGATIVE  ? Leukocytes,Ua LARGE (A) NEGATIVE  ? RBC / HPF 6-10 0 - 5 RBC/hpf  ? WBC, UA >50 (H) 0 - 5 WBC/hpf  ? Bacteria, UA MANY (A) NONE SEEN  ? Squamous Epithelial / LPF 0-5 0 - 5  ?  Comment: Performed at [facility], [address], [place], [place] [address]  ?Basic metabolic panel     Status: Abnormal  ? Collection Time: 06/16/21 11:39 AM  ?Result Value Ref Range  ? Sodium 135 135 - 145 mmol/L  ? Potassium 5.2 (H) 3.5 - 5.1 mmol/L  ?  Comment: HEMOLYSIS AT THIS LEVEL MAY AFFECT RESULT  ? Chloride 99 98 - 111 mmol/L  ? CO2 27 22 - 32 mmol/L  ? Glucose, Bld 73 70 - 99 mg/dL  ?  Comment: Glucose reference range applies only to samples taken after fasting for at least 8 hours.  ? BUN 20 8 - 23 mg/dL  ? Creatinine, Ser 1.33 (H) 0.44 - 1.00 mg/dL  ? Calcium 9.3 8.9 - 10.3 mg/dL  ? GFR, Estimated 39 (L) >60 mL/min  ?  Comment: (NOTE) ?Calculated using the CKD-EPI  Creatinine Equation (2021) ?  ? Anion gap 9 5 - 15  ?  Comment: Performed at [facility], [address], [place], [place] [address]  ?CBC     Status: Abnormal  ? Collection Time: 06/16/21 11:39 AM

## 2021-06-16 NOTE — ED Notes (Signed)
Informed RN bed assigned 

## 2021-06-16 NOTE — Assessment & Plan Note (Signed)
Mild ?--> follow am BMP ?

## 2021-06-17 ENCOUNTER — Inpatient Hospital Stay
Admit: 2021-06-17 | Discharge: 2021-06-17 | Disposition: A | Discharge status: Home / Self Care | Payer: Medicare Other | Attending: Osteopathic Medicine | Admitting: Osteopathic Medicine

## 2021-06-17 ENCOUNTER — Inpatient Hospital Stay: Payer: Medicare Other

## 2021-06-17 DIAGNOSIS — N390 Urinary tract infection, site not specified: Principal | ICD-10-CM

## 2021-06-17 LAB — ECHOCARDIOGRAM COMPLETE
Height: 67 in
S' Lateral: 2.6 cm
Weight: 3360 oz

## 2021-06-17 LAB — COMPREHENSIVE METABOLIC PANEL
ALT: 10 U/L (ref 0–44)
AST: 16 U/L (ref 15–41)
Albumin: 3 g/dL — ABNORMAL LOW (ref 3.5–5.0)
Alkaline Phosphatase: 74 U/L (ref 38–126)
Anion gap: 9 (ref 5–15)
BUN: 22 mg/dL (ref 8–23)
CO2: 29 mmol/L (ref 22–32)
Calcium: 8.9 mg/dL (ref 8.9–10.3)
Chloride: 99 mmol/L (ref 98–111)
Creatinine, Ser: 1.57 mg/dL — ABNORMAL HIGH (ref 0.44–1.00)
GFR, Estimated: 32 mL/min — ABNORMAL LOW (ref >60)
Glucose, Bld: 111 mg/dL — ABNORMAL HIGH (ref 70–99)
Potassium: 4.6 mmol/L (ref 3.5–5.1)
Sodium: 137 mmol/L (ref 135–145)
Total Bilirubin: 0.4 mg/dL (ref 0.3–1.2)
Total Protein: 6.6 g/dL (ref 6.5–8.1)

## 2021-06-17 LAB — BRAIN NATRIURETIC PEPTIDE: B Natriuretic Peptide: 192.9 pg/mL — ABNORMAL HIGH (ref 0.0–100.0)

## 2021-06-17 LAB — MRSA NEXT GEN BY PCR, NASAL: MRSA by PCR Next Gen: NOT DETECTED

## 2021-06-17 MED ORDER — ENSURE ENLIVE PO LIQD
237.0000 mL | Freq: Two times a day (BID) | ORAL | Status: DC
  Administered 2021-06-18 – 2021-06-20 (×4): 237 mL via ORAL

## 2021-06-17 MED ORDER — FUROSEMIDE 10 MG/ML IJ SOLN
40.0000 mg | Freq: Two times a day (BID) | INTRAMUSCULAR | Status: AC
  Administered 2021-06-17: 40 mg via INTRAVENOUS
  Filled 2021-06-17: qty 4

## 2021-06-17 MED ORDER — ADULT MULTIVITAMIN W/MINERALS CH
1.0000 | ORAL_TABLET | Freq: Every day | ORAL | Status: DC
  Administered 2021-06-18 – 2021-06-20 (×3): 1 via ORAL
  Filled 2021-06-17 (×3): qty 1

## 2021-06-17 NOTE — Progress Notes (Signed)
?PROGRESS NOTE ? ? ? Katrina Chen  [id number] DOB: 18-Aug-1935 DOA: 06/16/2021 ?PCP: Cephas Darby, FNP  ? ? ?Brief Narrative:  ?Patient presents to emergency department today, 06/16/2021, chief complaint of generalized body aches and vague chest pain, generalized weakness, and inability to stand/ambulate on her own.  History of dementia, daughter notes no increased confusion/altered mental status.  Patient denies fever, vomiting, diarrhea.  Reports may be some increased urinary frequency but not sure how long this has been going on. ?ED course 06/16/2021: Afebrile, vitals normal other than slight elevated blood pressure, normal WBC.  Stable anemia with hemoglobin 10.9, stable creatinine/CKD 3 with creatinine 1.33 and GFR 39, potassium slightly elevated at 5.2.  Urinalysis demonstrated large leukocytes, greater than 50 K WBC.  Urine culture pending.  Troponins normal x2, no concerns on EKG other than A-fib, chest x-ray showed increased cardiac size and right pleural effusion. ? ? ?Assessment & Plan: ?  ?Principal Problem: ?  Acute UTI ?Active Problems: ?  Nonspecific chest pain ?  CAD (coronary artery disease) ?  Hyperkalemia ?  History of bipolar disorder ?  Atrial fibrillation, chronic ([place]) ?  CKD (chronic kidney disease), stage III ([place]) ?  Weakness ?  Protein-calorie malnutrition ([place]) ?  Essential hypertension ?  General weakness ?  Pleural effusion w/ enlarged cardiac silhouette on CXR, trace edema ? ?UTI (urinary tract infection) ?Not meeting criteria for sepsis but infection may be contributing to generalized weakness in frail patient ?Plan: ?Continue IV Rocephin ?Follow urine culture ?Monitor vitals and fever curve ?  ?General weakness ?Likely d/t deconditioning/debility, exacerbated by UTI ?Ambulatory dysfunction ?Plan: ?Therapy evaluations ?ID consultation ?  ?Atrial fibrillation, chronic ([place]) ?continue Eliquis, beta blocker  ?  ?CKD (chronic kidney disease), stage III ([place]) ?Stable  ?Creatinine at  baseline ?  ?Hyperkalemia ?Mild ? ?  ?History of bipolar disorder ?Stable on current medications ?Plan:  ?Continue home depakote, zyprexa, effexor ?  ?Pleural effusion w/ enlarged cardiac silhouette on CXR, trace edema ?Volume status near euvolemic to hypovolemic ?Plan: ?Repeat TTE ?Strict I's and O's ?Hold Lasix for now ? ? ?DVT prophylaxis: Apixaban ?Code Status: DNR ?Family Communication: Daughter [name] [phone] on 4/7 ?Disposition Plan: Status is: Inpatient ?Remains inpatient appropriate because: Acute UTI.  Multifactorial weakness.  Anticipate discharge 4/8 ? ? ?Level of care: Med-Surg ? ?Consultants:  ?None ? ?Procedures:  ?None ? ?Antimicrobials: ?Ceftriaxone ? ? ?Subjective: ?Seen and examined.  Resting in bed.  No visible distress.  Answers all questions appropriately.  No pain complaints.  Does endorse weakness and diffuse body aches. ? ?Objective: ?Vitals:  ? 06/16/21 2130 06/16/21 2210 06/17/21 0406 06/17/21 0817  ?BP: (!) 153/55 (!) 144/51 (!) 109/46 (!) 110/48  ?Pulse: 60 61 60 60  ?Resp: 18 18 20 19   ?Temp:  98.3 ?F (36.8 ?C) 98.5 ?F (36.9 ?C) 100.2 ?F (37.9 ?C)  ?TempSrc:  Oral    ?SpO2: 100% 100% 99% 97%  ?Weight:      ?Height:      ? ? ?Intake/Output Summary (Last 24 hours) at 06/17/2021 1041 ?Last data filed at 06/17/2021 0116 ?Gross per 24 hour  ?Intake 84.93 ml  ?Output 2200 ml  ?Net -2115.07 ml  ? ?Filed Weights  ? 06/16/21 1101  ?Weight: 95.3 kg  ? ? ?Examination: ? ?General exam: No acute distress.  Appears frail ?Respiratory system: Bibasilar crackles.  Normal work of breathing.  Room air ?Cardiovascular system: S1-S2, RRR, no murmurs, no pedal edema ?Gastrointestinal system: Soft, NT/ND, normal bowel  sounds ?Central nervous system: Alert and oriented. No focal neurological deficits. ?Extremities: Symmetric 5 x 5 power. ?Skin: No rashes, lesions or ulcers ?Psychiatry: Judgement and insight appear normal. Mood & affect appropriate.  ? ? ? ?Data Reviewed: I have personally reviewed  following labs and imaging studies ? ?CBC: ?Recent Labs  ?Lab 06/16/21 ?1139  ?WBC 5.2  ?HGB 10.9*  ?HCT 37.9  ?MCV 79.1*  ?PLT 258  ? ?Basic Metabolic Panel: ?Recent Labs  ?Lab 06/16/21 ?1139 06/17/21 ?0610  ?NA 135 137  ?K 5.2* 4.6  ?CL 99 99  ?CO2 27 29  ?GLUCOSE 73 111*  ?BUN 20 22  ?CREATININE 1.33* 1.57*  ?CALCIUM 9.3 8.9  ? ?GFR: ?Estimated Creatinine Clearance (by C-G formula based on SCr of 1.57 mg/dL (H)) ?Female: 31.6 mL/min (A) ?Female: 38.5 mL/min (A) ?Liver Function Tests: ?Recent Labs  ?Lab 06/16/21 ?1139 06/17/21 ?0610  ?AST 29 16  ?ALT 13 10  ?ALKPHOS 84 74  ?BILITOT 0.6 0.4  ?PROT 7.3 6.6  ?ALBUMIN 3.2* 3.0*  ? ?No results for input(s): LIPASE, AMYLASE in the last 168 hours. ?No results for input(s): AMMONIA in the last 168 hours. ?Coagulation Profile: ?No results for input(s): INR, PROTIME in the last 168 hours. ?Cardiac Enzymes: ?Recent Labs  ?Lab 06/16/21 ?1139  ?CKTOTAL 132  ? ?BNP (last 3 results) ?No results for input(s): PROBNP in the last 8760 hours. ?HbA1C: ?No results for input(s): HGBA1C in the last 72 hours. ?CBG: ?No results for input(s): GLUCAP in the last 168 hours. ?Lipid Profile: ?No results for input(s): CHOL, HDL, LDLCALC, TRIG, CHOLHDL, LDLDIRECT in the last 72 hours. ?Thyroid Function Tests: ?No results for input(s): TSH, T4TOTAL, FREET4, T3FREE, THYROIDAB in the last 72 hours. ?Anemia Panel: ?No results for input(s): VITAMINB12, FOLATE, FERRITIN, TIBC, IRON, RETICCTPCT in the last 72 hours. ?Sepsis Labs: ?No results for input(s): PROCALCITON, LATICACIDVEN in the last 168 hours. ? ?Recent Results (from the past 240 hour(s))  ?Resp Panel by RT-PCR (Flu A&B, Covid) Nasopharyngeal Swab     Status: None  ? Collection Time: 06/16/21 12:04 PM  ? Specimen: Nasopharyngeal Swab; Nasopharyngeal(NP) swabs in vial transport medium  ?Result Value Ref Range Status  ? SARS Coronavirus 2 by RT PCR NEGATIVE NEGATIVE Final  ?  Comment: (NOTE) ?SARS-CoV-2 target nucleic acids are NOT  DETECTED. ? ?The SARS-CoV-2 RNA is generally detectable in upper respiratory ?specimens during the acute phase of infection. The lowest ?concentration of SARS-CoV-2 viral copies this assay can detect is ?138 copies/mL. A negative result does not preclude SARS-Cov-2 ?infection and should not be used as the sole basis for treatment or ?other patient management decisions. A negative result may occur with  ?improper specimen collection/handling, submission of specimen other ?than nasopharyngeal swab, presence of viral mutation(s) within the ?areas targeted by this assay, and inadequate number of viral ?copies(<138 copies/mL). A negative result must be combined with ?clinical observations, patient history, and epidemiological ?information. The expected result is Negative. ? ?Fact Sheet for Patients:  ?BloggerCourse.com ? ?Fact Sheet for Healthcare Providers:  ?SeriousBroker.it ? ?This test is no t yet approved or cleared by the [place] FDA and  ?has been authorized for detection and/or diagnosis of SARS-CoV-2 by ?FDA under an Emergency Use Authorization (EUA). This EUA will remain  ?in effect (meaning this test can be used) for the duration of the ?COVID-19 declaration under Section 564(b)(1) of the Act, 21 ?U.S.C.section 360bbb-3(b)(1), unless the authorization is terminated  ?or revoked sooner.  ? ? ?  ? Influenza  A by PCR NEGATIVE NEGATIVE Final  ? Influenza B by PCR NEGATIVE NEGATIVE Final  ?  Comment: (NOTE) ?The Xpert Xpress SARS-CoV-2/FLU/RSV plus assay is intended as an aid ?in the diagnosis of influenza from Nasopharyngeal swab specimens and ?should not be used as a sole basis for treatment. Nasal washings and ?aspirates are unacceptable for Xpert Xpress SARS-CoV-2/FLU/RSV ?testing. ? ?Fact Sheet for Patients: ?EntrepreneurPulse.com.au ? ?Fact Sheet for Healthcare Providers: ?IncredibleEmployment.be ? ?This test is not yet  approved or cleared by the [place] FDA and ?has been authorized for detection and/or diagnosis of SARS-CoV-2 by ?FDA under an Emergency Use Authorization (EUA). This EUA will remain ?in effect (meaning this test ca

## 2021-06-17 NOTE — Plan of Care (Signed)

## 2021-06-17 NOTE — TOC Initial Note (Signed)
Transition of Care (TOC) - Initial/Assessment Note  ? ? ?Patient Details  ?Name: Katrina Chen ?MRN: [id number] ?Date of Birth: September 19, 1935 ? ?Transition of Care (TOC) CM/SW Contact:    ?Laurena Slimmer, RN ?Phone Number: ?06/17/2021, 10:57 AM ? ?Clinical Narrative:                 ?Met with patient to confirm LTC residence at [facility]. Spoke with admissions coordinator at [facility] to confirm patient is LTC.  ? ? ?Expected Discharge Plan: [place] ?Barriers to Discharge: Continued Medical Work up ? ? ?Patient Goals and CMS Choice ?  ?  ?Choice offered to / list presented to : NA ? ?Expected Discharge Plan and Services ?Expected Discharge Plan: [place] ?  ?  ?Post Acute Care Choice: Resumption of Svcs/PTA Provider ?Living arrangements for the past 2 months: [place] ?                ?  ?  ?  ?  ?  ?  ?  ?  ?  ?  ? ?Prior Living Arrangements/Services ?Living arrangements for the past 2 months: [place] ?Lives with:: Facility Resident ?Patient language and need for interpreter reviewed:: Yes ?Do you feel safe going back to the place where you live?: Yes      ?Need for Family Participation in Patient Care: Yes (Comment) ?Care giver support system in place?: Yes (comment) ?  ?Criminal Activity/Legal Involvement Pertinent to Current Situation/Hospitalization: No - Comment as needed ? ?Activities of Daily Living ?Home Assistive Devices/Equipment: Gilford Rile (specify type), Wheelchair, Built-in shower seat ?ADL Screening (condition at time of admission) ?Patient's cognitive ability adequate to safely complete daily activities?: No ?Is the patient deaf or have difficulty hearing?: No ?Does the patient have difficulty seeing, even when wearing glasses/contacts?: No ?Does the patient have difficulty concentrating, remembering, or making decisions?: No (not on assessment) ?Patient able to express need for assistance with ADLs?: Yes ?Does the patient have difficulty dressing or  bathing?: Yes ?Independently performs ADLs?: No ?Communication: Independent ?Dressing (OT): Needs assistance ?Is this a change from baseline?: Pre-admission baseline ?Grooming: Needs assistance ?Is this a change from baseline?: Pre-admission baseline ?Feeding: Needs assistance ?Is this a change from baseline?: Pre-admission baseline ?Bathing: Needs assistance ?Is this a change from baseline?: Pre-admission baseline ?Toileting: Needs assistance ?Is this a change from baseline?: Pre-admission baseline ?In/Out Bed: Needs assistance ?Is this a change from baseline?: Pre-admission baseline ?Walks in Home: Needs assistance ?Is this a change from baseline?: Pre-admission baseline ?Does the patient have difficulty walking or climbing stairs?: Yes ?Weakness of Legs: Both ?Weakness of Arms/Hands: Both ? ?Permission Sought/Granted ?Permission sought to share information with : Customer service manager ?Permission granted to share information with : Yes, Verbal Permission Granted ?   ? Permission granted to share info w AGENCY: [facility] ?   ?   ? ?Emotional Assessment ?Appearance:: Appears stated age ?Attitude/Demeanor/Rapport: Engaged ?Affect (typically observed): Accepting ?Orientation: : Oriented to Self, Oriented to Place, Oriented to  Time, Oriented to Situation ?Alcohol / Substance Use: Not Applicable ?Psych Involvement: No (comment) ? ?Admission diagnosis:  UTI (urinary tract infection) [N39.0] ?Weakness [R53.1] ?Acute UTI [N39.0] ?Nonspecific chest pain [R07.9] ?Patient Active Problem List  ? Diagnosis Date Noted  ? Acute UTI 06/16/2021  ? General weakness 06/16/2021  ? Pleural effusion w/ enlarged cardiac silhouette on CXR, trace edema 06/16/2021  ? Spinal stenosis of lumbar region 11/12/2020  ? Nonspecific chest pain 11/05/2020  ? CAD (coronary  artery disease) 11/05/2020  ? Hyperkalemia 11/05/2020  ? Obstipation 11/05/2020  ? History of bipolar disorder 11/05/2020  ? Atrial fibrillation, chronic ([place]) 11/05/2020  ?  CKD (chronic kidney disease), stage III ([place]) 11/05/2020  ? Weakness 11/05/2020  ? Protein-calorie malnutrition ([place]) 11/05/2020  ? Status post total bilateral knee replacement 05/27/2013  ? Essential hypertension 04/22/2013  ? ?PCP:  Cephas Darby, FNP ?Pharmacy:   ?[facility] [place], [place] ?[place] ?[place] [address] ?Phone: [phone] Fax: [phone] ? ? ? ? ?Social Determinants of Health (SDOH) Interventions ?  ? ?Readmission Risk Interventions ?   ? View : No data to display.  ?  ?  ?  ? ? ? ?

## 2021-06-17 NOTE — Evaluation (Signed)
Physical Therapy Evaluation ?Patient Details ?Name: Katrina Chen ?MRN: [id number] ?DOB: [birth year] ?Today's Date: 06/17/2021 ? ?History of Present Illness ? Patient is an 86 yo F who presented to emergency department 06/16/2021 with chief complaint of generalized body aches and vague chest pain, generalized weakness, and inability to stand/ambulate on her own.  History of dementia. MD assessment includes: UTI, weakness, and hyperkalemia. ?  ?Clinical Impression ? Pt was pleasant and put forth good effort throughout the session.  Pt required no physical assistance to come to sitting at the EOB with the [facility] elevated and performed sit to/from stand transfers and SPT transfers from bed to chair with cuing for sequencing and +2 Min A.  Pt currently at her baseline level of function per chart review and patient report.  Will complete PT orders at this time but will reassess pt pending a change in status upon receipt of new PT orders. ?   ?   ? ?Recommendations for follow up therapy are one component of a multi-disciplinary discharge planning process, led by the attending physician.  Recommendations may be updated based on patient status, additional functional criteria and insurance authorization. ? ?Follow Up Recommendations No PT follow up ? ?  ?Assistance Recommended at Discharge None  ?Patient can return home with the following ? Two people to help with walking and/or transfers;A lot of help with bathing/dressing/bathroom;Assistance with cooking/housework;Direct supervision/assist for medications management;Assist for transportation ? ?  ?Equipment Recommendations None recommended by PT  ?Recommendations for Other Services ?    ?  ?Functional Status Assessment Patient has not had a recent decline in their functional status  ? ?  ?Precautions / Restrictions Precautions ?Precautions: Fall ?Restrictions ?Weight Bearing Restrictions: No  ? ?  ? ?Mobility ? Bed Mobility ?Overal bed mobility: Modified Independent ?  ?  ?  ?  ?  ?   ?General bed mobility comments: Extra time and effort with HOB elevated to come from supine to sitting ?  ? ?Transfers ?Overall transfer level: Needs assistance ?Equipment used: Rolling walker (2 wheels), None ?Transfers: Sit to/from Stand, Bed to chair/wheelchair/BSC ?Sit to Stand: From elevated surface, Min assist, +2 safety/equipment ?  ?  ?Squat pivot transfers: Min assist, +2 safety/equipment ?  ?  ?General transfer comment: Mod verbal and tactile cues for sequencing, especially with SPT's, but with only min A needed to perform the transfers safely ?  ? ?Ambulation/Gait ?  ?  ?  ?  ?  ?  ?  ?General Gait Details: Attempted side-stepping at EOB to assess pt's ability with pt unable to advance either LE; pt non-ambulatory at baseline ? ?Stairs ?  ?  ?  ?  ?  ? ?Wheelchair Mobility ?  ? ?Modified Rankin (Stroke Patients Only) ?  ? ?  ? ?Balance Overall balance assessment: Needs assistance ?  ?Sitting balance-Leahy Scale: Normal ?  ?  ?Standing balance support: Bilateral upper extremity supported, During functional activity ?Standing balance-Leahy Scale: Fair ?  ?  ?  ?  ?  ?  ?  ?  ?  ?  ?  ?  ?   ? ? ? ?Pertinent Vitals/Pain Pain Assessment ?Pain Assessment: 0-10 ?Pain Score: 8  ?Pain Location: General "whole body" soreness that is chronic per patient.  No signs of distress during the session ?Pain Descriptors / Indicators: Sore ?Pain Intervention(s): Repositioned, Monitored during session  ? ? ?Home Living Family/patient expects to be discharged to:: Skilled nursing facility ?  ?  ?  ?  ?  ?  ?  ?  ?  ?   ?  ?  Prior Function   ?  ?  ?  ?  ?  ?  ?Mobility Comments: Per patient and consistent with chart review pt is a resident at [facility] and is non-ambulatory at baseline; baseline includes +2 from staff for transfers only; pt does not self-propel her w/c, no fall history ?ADLs Comments: Assist from staff with ADLs ?  ? ? ?Hand Dominance  ?   ? ?  ?Extremity/Trunk Assessment  ? Upper Extremity  Assessment ?Upper Extremity Assessment: Generalized weakness ?  ? ?Lower Extremity Assessment ?Lower Extremity Assessment: Generalized weakness ?  ? ?   ?Communication  ? Communication: No difficulties  ?Cognition Arousal/Alertness: Awake/alert ?Behavior During Therapy: [facility] for tasks assessed/performed ?Overall Cognitive Status: Within Functional Limits for tasks assessed ?  ?  ?  ?  ?  ?  ?  ?  ?  ?  ?  ?  ?  ?  ?  ?  ?  ?  ?  ? ?  ?General Comments   ? ?  ?Exercises    ? ?Assessment/Plan  ?  ?PT Assessment Patient does not need any further PT services  ?PT Problem List   ? ?   ?  ?PT Treatment Interventions     ? ?PT Goals (Current goals can be found in the Care Plan section)  ?Acute Rehab PT Goals ?PT Goal Formulation: All assessment and education complete, DC therapy ? ?  ?Frequency   ?  ? ? ?Co-evaluation   ?  ?  ?  ?  ? ? ?  ?AM-PAC PT "6 Clicks" Mobility  ?Outcome Measure Help needed turning from your back to your side while in a flat bed without using bedrails?: A Little ?Help needed moving from lying on your back to sitting on the side of a flat bed without using bedrails?: A Little ?Help needed moving to and from a bed to a chair (including a wheelchair)?: A Little ?Help needed standing up from a chair using your arms (e.g., wheelchair or bedside chair)?: A Little ?Help needed to walk in hospital room?: Total ?Help needed climbing 3-5 steps with a railing? : Total ?6 Click Score: 14 ? ?  ?End of Session Equipment Utilized During Treatment: Gait belt ?Activity Tolerance: Patient tolerated treatment well ?Patient left: in chair;with call bell/phone within reach;with chair alarm set;with nursing/sitter in room ?Nurse Communication: Mobility status ?PT Visit Diagnosis: Muscle weakness (generalized) (M62.81) ?  ? ?Time: 0937-1000 ?PT Time Calculation (min) (ACUTE ONLY): 23 min ? ? ?Charges:   PT Evaluation ?$PT Eval Low Complexity: 1 Low ?  ?  ?   ? ?D. Elly Modena PT, DPT ?06/17/21, 11:25 AM ? ? ?

## 2021-06-17 NOTE — Evaluation (Signed)
Occupational Therapy Evaluation ?Patient Details ?Name: Katrina Chen ?MRN: [id number] ?DOB: 13-Jul-1935 ?Today's Date: 06/17/2021 ? ? ?History of Present Illness Patient is an 86 yo F who presented to emergency department 06/16/2021 with chief complaint of generalized body aches and vague chest pain, generalized weakness, and inability to stand/ambulate on her own.  History of dementia. MD assessment includes: UTI, weakness, and hyperkalemia.  ? ?Clinical Impression ?  ?Katrina Chen presents with generalized weakness, limited endurance, impaired balance. She required Mod A +2 for transfers, with multimodal cueing needed. Pt endorses 6/10 pain but reports that she is "feeling better" than her usual. Per pt information and chart review, pt is at her baseline level of fxl mobility. She is a long-term care resident at [facility]. DC to this location is recommended for her, no additional OT services necessary at present. Will sign off now.   ? ?Recommendations for follow up therapy are one component of a multi-disciplinary discharge planning process, led by the attending physician.  Recommendations may be updated based on patient status, additional functional criteria and insurance authorization.  ? ?Follow Up Recommendations ? Long-term institutional care without follow-up therapy  ?  ?Assistance Recommended at Discharge Frequent or constant Supervision/Assistance  ?Patient can return home with the following A lot of help with walking and/or transfers;A lot of help with bathing/dressing/bathroom;Assistance with cooking/housework ? ?  ?Functional Status Assessment ? Patient has not had a recent decline in their functional status  ?Equipment Recommendations ? None recommended by OT  ?  ?Recommendations for Other Services   ? ? ?  ?Precautions / Restrictions Precautions ?Precautions: Fall ?Restrictions ?Weight Bearing Restrictions: No  ? ?  ? ?Mobility Bed Mobility ?  ?  ?  ?  ?  ?  ?  ?General bed mobility comments: Pt received, left in  reclliner ?  ? ?Transfers ?Overall transfer level: Needs assistance ?Equipment used: Rolling walker (2 wheels), None ?Transfers: Sit to/from Stand, Bed to chair/wheelchair/BSC ?Sit to Stand: From elevated surface, +2 safety/equipment, Mod assist ?  ?Squat pivot transfers: Mod assist, +2 physical assistance ?  ?  ?  ?General transfer comment: cueing for hand positioning on RW, +2 Mod A required ?  ? ?  ?Balance Overall balance assessment: Needs assistance ?  ?Sitting balance-Leahy Scale: Normal ?  ?  ?Standing balance support: Bilateral upper extremity supported, During functional activity ?Standing balance-Leahy Scale: Poor ?  ?  ?  ?  ?  ?  ?  ?  ?  ?  ?  ?  ?   ? ?ADL either performed or assessed with clinical judgement  ? ?ADL   ?  ?  ?  ?  ?  ?  ?  ?  ?  ?  ?  ?  ?  ?  ?  ?  ?  ?  ?  ?   ? ? ? ?Vision Patient Visual Report: No change from baseline ?   ?   ?Perception   ?  ?Praxis   ?  ? ?Pertinent Vitals/Pain Pain Assessment ?Pain Score: 6  ?Pain Location: General "whole body" soreness that is chronic per patient.  No signs of distress during the session ?Pain Descriptors / Indicators: Sore ?Pain Intervention(s): Repositioned, Limited activity within patient's tolerance  ? ? ? ?Hand Dominance   ?  ?Extremity/Trunk Assessment Upper Extremity Assessment ?Upper Extremity Assessment: Generalized weakness ?  ?Lower Extremity Assessment ?Lower Extremity Assessment: Generalized weakness ?  ?  ?  ?Communication Communication ?Communication: No difficulties ?  ?  Cognition Arousal/Alertness: Awake/alert ?Behavior During Therapy: Katrina Chen for tasks assessed/performed ?Overall Cognitive Status: Within Functional Limits for tasks assessed ?  ?  ?  ?  ?  ?  ?  ?  ?  ?  ?  ?  ?  ?  ?  ?  ?  ?  ?  ?General Comments    ? ?  ?Exercises Other Exercises ?Other Exercises: Therex in sitting, educ re: importance of movement, repositioning for comfort, skin integrity ?  ?Shoulder Instructions    ? ? ?Home Living Family/patient expects to be  discharged to:: Skilled nursing facility ?  ?  ?  ?  ?  ?  ?  ?  ?  ?  ?  ?  ?  ?  ?  ?  ?  ?  ? ?  ?Prior Functioning/Environment   ?  ?  ?  ?  ?  ?  ?Mobility Comments: Per patient and consistent with chart review pt is a resident at [facility] and is non-ambulatory at baseline; baseline includes +2 from staff for transfers only; pt does not self-propel her w/c, no fall history ?ADLs Comments: Assist from staff with ADLs ?  ? ?  ?  ?OT Problem List: Decreased strength;Decreased activity tolerance ?  ?   ?OT Treatment/Interventions: Self-care/ADL training;Patient/family education;Therapeutic exercise;Balance training;Therapeutic activities;DME and/or AE instruction  ?  ?OT Goals(Current goals can be found in the care plan section) Acute Rehab OT Goals ?Patient Stated Goal: to get back to [facility] ?OT Goal Formulation: With patient ?Time For Goal Achievement: 07/01/21 ?Potential to Achieve Goals: Good ?  ?OT Frequency:  ?  ? ?Co-evaluation   ?  ?  ?  ?  ? ?  ?AM-PAC OT "6 Clicks" Daily Activity     ?Outcome Measure Help from another person eating meals?: None ?Help from another person taking care of personal grooming?: A Little ?Help from another person toileting, which includes using toliet, bedpan, or urinal?: A Lot ?Help from another person bathing (including washing, rinsing, drying)?: A Lot ?Help from another person to put on and taking off regular upper body clothing?: A Lot ?Help from another person to put on and taking off regular lower body clothing?: A Lot ?6 Click Score: 4 ?  ?End of Session Equipment Utilized During Treatment: Rolling walker (2 wheels) ? ?Activity Tolerance: Patient tolerated treatment well ?Patient left: in chair;with call bell/phone within reach;with chair alarm set;with nursing/sitter in room ? ?OT Visit Diagnosis: Unsteadiness on feet (R26.81);Muscle weakness (generalized) (M62.81)  ?              ?Time: [id number] ?OT Time Calculation (min): 13 min ?Charges:  OT General Charges ?$OT  Visit: 1 Visit ?OT Evaluation ?$OT Eval Moderate Complexity: 1 Mod ?OT Treatments ?$Self Care/Home Management : 8-22 mins ?Josiah Lobo, PhD, MS, OTR/L ?06/17/21, 12:42 PM ? ?

## 2021-06-17 NOTE — Progress Notes (Addendum)
Initial Nutrition Assessment ? ?DOCUMENTATION CODES:  ? ?Obesity unspecified ? ?INTERVENTION:  ? ?Ensure Enlive po BID, each supplement provides 350 kcal and 20 grams of protein. ? ?MVI po daily  ? ?Mechanical soft diet with ground meats  ? ?NUTRITION DIAGNOSIS:  ? ?Inadequate oral intake related to acute illness as evidenced by per patient/family report. ? ?GOAL:  ? ?Patient will meet greater than or equal to 90% of their needs ? ?MONITOR:  ? ?PO intake, Supplement acceptance, Labs, Weight trends, Skin, I & O's ? ?REASON FOR ASSESSMENT:  ? ?Consult ?Assessment of nutrition requirement/status ? ?ASSESSMENT:  ? ?86 y/o female with h/o dementia, Afib, CKD III, bipolar disorder, CAD and HTN who is admitted with UTI and pleural effusion. ? ?Met with pt in room today. Pt is a poor historian but reports fairly good appetite and oral intake at baseline. Pt does report decreased oral intake for several days pta. Pt reports that she does not really like the food at her facility where she lives. Pt reports good appetite and oral intake in hospital; pt eating 100% of meals. Pt reports that she needs soft foods and ground meats as she is edentulous. Pt reports that she likes vanilla or strawberry Ensure but reports that she does not drink this at home. RD will add supplements and MVI to help pt meet her estimated needs. Per chart, pt with weight gain pta but appears fairly weight stable at baseline.  ? ?Medications reviewed and include: melatonin, miralax, senokot, ceftriaxone  ? ?Labs reviewed: K 4.6 wnl, creat 1.57(H) ?Hgb 10.9(L)- 4/6 ? ?NUTRITION - FOCUSED PHYSICAL EXAM: ? ?Flowsheet Row Most Recent Value  ?Orbital Region No depletion  ?Upper Arm Region No depletion  ?Thoracic and Lumbar Region No depletion  ?Buccal Region No depletion  ?Temple Region No depletion  ?Clavicle Bone Region No depletion  ?Clavicle and Acromion Bone Region No depletion  ?Scapular Bone Region No depletion  ?Dorsal Hand No depletion  ?Patellar  Region No depletion  ?Anterior Thigh Region No depletion  ?Posterior Calf Region No depletion  ?Edema (RD Assessment) None  ?Hair Reviewed  ?Eyes Reviewed  ?Mouth Reviewed  ?Skin Reviewed  ?Nails Reviewed  ? ?Diet Order:   ?Diet Order   ? ?       ?  DIET DYS 3 Room service appropriate? Yes; Fluid consistency: Thin  Diet effective now       ?  ? ?  ?  ? ?  ? ?EDUCATION NEEDS:  ? ?Education needs have been addressed ? ?Skin:  Skin Assessment: Reviewed RN Assessment ? ?Last BM:  4/5 ? ?Height:  ? ?Ht Readings from Last 1 Encounters:  ?06/16/21 $RemoveBe'5\' 7"'VjmcIDtup$  (1.702 m)  ? ? ?Weight:  ? ?Wt Readings from Last 1 Encounters:  ?06/16/21 95.3 kg  ? ? ?Ideal Body Weight:  61.3 kg ? ?BMI:  Body mass index is 32.89 kg/m?. ? ?Estimated Nutritional Needs:  ? ?Kcal:  1700-2000kcal/day ? ?Protein:  85-100g/day ? ?Fluid:  1.5-1.8L/day ? ?Katrina Distance MS, RD, LDN ?Please refer to [facility]ION for RD and/or RD on-call/weekend/after hours pager ? ?

## 2021-06-17 NOTE — Progress Notes (Signed)
*  PRELIMINARY RESULTS* ?Echocardiogram ?2D Echocardiogram has been performed. ? ?Katrina Chen, [name] ?06/17/2021, 11:05 AM ?

## 2021-06-17 NOTE — Progress Notes (Signed)
?   06/17/21 0900  ?Clinical Encounter Type  ?Visited With Patient  ?Visit Type Initial;Spiritual support  ?Spiritual Encounters  ?Spiritual Needs Prayer  ? ?Chaplain provided support through meaningful conversation and prayer ?

## 2021-06-17 NOTE — [place] FL2 (Signed)
?[place] MEDICAID FL2 LEVEL OF CARE SCREENING TOOL  ?  ? ?IDENTIFICATION  ?Patient Name: ?Katrina Chen Birthdate: 05-10-1935 Sex: adult Admission Date (Current Location): ?06/16/2021  ?[place] and [place] Number: ? [place] ?  Facility and Address:  ?[facility], [address] [place], [place] [address] ?     Provider Number: ?[id number]  ?Attending Physician Name and Address:  ?Sidney Ace, MD ? Relative Name and Phone Number:  ?  ?   ?Current Level of Care: ?Hospital Recommended Level of Care: ?[place] Prior Approval Number: ?  ? ?Date Approved/Denied: ?  PASRR Number: ?[id number] C ? ?Discharge Plan: ?SNF ?  ? ?Current Diagnoses: ?Patient Active Problem List  ? Diagnosis Date Noted  ? Acute UTI 06/16/2021  ? General weakness 06/16/2021  ? Pleural effusion w/ enlarged cardiac silhouette on CXR, trace edema 06/16/2021  ? Spinal stenosis of lumbar region 11/12/2020  ? Nonspecific chest pain 11/05/2020  ? CAD (coronary artery disease) 11/05/2020  ? Hyperkalemia 11/05/2020  ? Obstipation 11/05/2020  ? History of bipolar disorder 11/05/2020  ? Atrial fibrillation, chronic ([place]) 11/05/2020  ? CKD (chronic kidney disease), stage III ([place]) 11/05/2020  ? Weakness 11/05/2020  ? Protein-calorie malnutrition ([place]) 11/05/2020  ? Status post total bilateral knee replacement 05/27/2013  ? Essential hypertension 04/22/2013  ? ? ?Orientation RESPIRATION BLADDER Height & Weight   ?  ?Self, Time, Situation, Place ? Normal Incontinent Weight: 95.3 kg ?Height:  5\' 7"  (170.2 cm)  ?BEHAVIORAL SYMPTOMS/MOOD NEUROLOGICAL BOWEL NUTRITION STATUS  ? (none)  (vasular demetia) Continent Diet (DYS 3)  ?AMBULATORY STATUS COMMUNICATION OF NEEDS Skin   ?  Verbally Normal ?  ?  ?  ?    ?     ?     ? ? ?Personal Care Assistance Level of Assistance  ?    ?  ?  ?   ? ?Functional Limitations Info  ?Sight, Hearing, Speech Sight Info: Adequate ?Hearing Info: Adequate ?Speech Info: Adequate  ? ? ?SPECIAL  CARE FACTORS FREQUENCY  ?    ?  ?  ?  ?  ?  ?  ?   ? ? ?Contractures Contractures Info: Not present  ? ? ?Additional Factors Info  ?Code Status, Allergies Code Status Info: DNR ?Allergies Info: Iodine, Amlodipine, Nifedipine, Lisinopril, Quinapril, Shellfish Allergy, Simvastatin ?  ?  ?  ?   ? ?Current Medications (06/17/2021):  This is the current hospital active medication list ?Current Facility-Administered Medications  ?Medication Dose Route Frequency Provider Last Rate Last Admin  ? apixaban (ELIQUIS) tablet 2.5 mg  2.5 mg Oral BID Emeterio Reeve, DO   2.5 mg at 06/17/21 [id number]  ? carvedilol (COREG) tablet 25 mg  25 mg Oral BID Emeterio Reeve, DO   25 mg at 06/17/21 [id number]  ? cefTRIAXone (ROCEPHIN) 1 g in sodium chloride 0.9 % 100 mL IVPB  1 g Intravenous Q24H Emeterio Reeve, DO 200 mL/hr at 06/17/21 1018 1 g at 06/17/21 1018  ? celecoxib (CELEBREX) capsule 100 mg  100 mg Oral BID PRN Emeterio Reeve, DO      ? cetirizine (ZYRTEC) tablet 5 mg  5 mg Oral Daily Emeterio Reeve, DO   5 mg at 06/17/21 [id number]  ? cloNIDine (CATAPRES - Dosed in mg/24 hr) patch 0.2 mg  0.2 mg Transdermal Weekly Emeterio Reeve, DO   0.2 mg at 06/16/21 1840  ? cycloSPORINE (RESTASIS) 0.05 % ophthalmic emulsion 1 drop  1 drop Both Eyes BID Emeterio Reeve, DO  1 drop at 06/17/21 0957  ? divalproex (DEPAKOTE SPRINKLE) capsule 125 mg  125 mg Oral BID Emeterio Reeve, DO   125 mg at 06/17/21 [id number]  ? fluticasone (FLONASE) 50 MCG/ACT nasal spray 1 spray  1 spray Each Nare Daily Emeterio Reeve, DO      ? gabapentin (NEURONTIN) capsule 100 mg  100 mg Oral Q8H Alexander, Lanelle Bal, DO   100 mg at 06/17/21 [id number]  ? hydrALAZINE (APRESOLINE) tablet 10 mg  10 mg Oral TID Emeterio Reeve, DO   10 mg at 06/17/21 [id number]  ? lidocaine (LIDODERM) 5 % 1 patch  1 patch Transdermal Q24H Emeterio Reeve, DO   1 patch at 06/16/21 2322  ? losartan (COZAAR) tablet 100 mg  100 mg Oral Daily Emeterio Reeve, DO   100 mg at 06/17/21 [id number]   ? melatonin tablet 10 mg  10 mg Oral QHS Emeterio Reeve, DO   10 mg at 06/16/21 2322  ? OLANZapine (ZYPREXA) tablet 2.5 mg  2.5 mg Oral QHS Emeterio Reeve, DO   2.5 mg at 06/17/21 0113  ? olopatadine (PATANOL) 0.1 % ophthalmic solution 1 drop  1 drop Both Eyes BID Emeterio Reeve, DO   1 drop at 06/17/21 0957  ? polyethylene glycol (MIRALAX / GLYCOLAX) packet 17 g  17 g Oral BID Emeterio Reeve, DO   17 g at 06/17/21 [id number]  ? prazosin (MINIPRESS) capsule 1 mg  1 mg Oral QHS Emeterio Reeve, DO   1 mg at 06/16/21 2325  ? senna-docusate (Senokot-S) tablet 1 tablet  1 tablet Oral BID Emeterio Reeve, DO   1 tablet at 06/17/21 [id number]  ? tamsulosin (FLOMAX) capsule 0.4 mg  0.4 mg Oral Daily Emeterio Reeve, DO   0.4 mg at 06/17/21 [id number]  ? traMADol (ULTRAM) tablet 50 mg  50 mg Oral Q6H PRN Emeterio Reeve, DO   50 mg at 06/17/21 0113  ? venlafaxine XR (EFFEXOR-XR) 24 hr capsule 75 mg  75 mg Oral Daily Emeterio Reeve, DO   75 mg at 06/17/21 [id number]  ? ? ? ?Discharge Medications: ?Please see discharge summary for a list of discharge medications. ? ?Relevant Imaging Results: ? ?Relevant Lab Results: ? ? ?Additional Information ?SS# [id number] ? ?Laurena Slimmer, RN ? ? ? ? ?

## 2021-06-18 ENCOUNTER — Inpatient Hospital Stay: Payer: Medicare Other

## 2021-06-18 DIAGNOSIS — N390 Urinary tract infection, site not specified: Secondary | ICD-10-CM | POA: Diagnosis not present

## 2021-06-18 LAB — CBC WITH DIFFERENTIAL/PLATELET
Abs Immature Granulocytes: 0.04 10*3/uL (ref 0.00–0.07)
Basophils Absolute: 0 10*3/uL (ref 0.0–0.1)
Basophils Relative: 1 %
Eosinophils Absolute: 0.2 10*3/uL (ref 0.0–0.5)
Eosinophils Relative: 3 %
HCT: 33.7 % — ABNORMAL LOW (ref 36.0–46.0)
Hemoglobin: 9.9 g/dL — ABNORMAL LOW (ref 12.0–15.0)
Immature Granulocytes: 1 %
Lymphocytes Relative: 30 %
Lymphs Abs: 2 10*3/uL (ref 0.7–4.0)
MCH: 22.9 pg — ABNORMAL LOW (ref 26.0–34.0)
MCHC: 29.4 g/dL — ABNORMAL LOW (ref 30.0–36.0)
MCV: 77.8 fL — ABNORMAL LOW (ref 80.0–100.0)
Monocytes Absolute: 0.9 10*3/uL (ref 0.1–1.0)
Monocytes Relative: 14 %
Neutro Abs: 3.5 10*3/uL (ref 1.7–7.7)
Neutrophils Relative %: 51 %
Platelets: 248 10*3/uL (ref 150–400)
RBC: 4.33 MIL/uL (ref 3.87–5.11)
RDW: 15.6 % — ABNORMAL HIGH (ref 11.5–15.5)
WBC: 6.6 10*3/uL (ref 4.0–10.5)
nRBC: 0 % (ref 0.0–0.2)

## 2021-06-18 LAB — BASIC METABOLIC PANEL
Anion gap: 7 (ref 5–15)
BUN: 36 mg/dL — ABNORMAL HIGH (ref 8–23)
CO2: 29 mmol/L (ref 22–32)
Calcium: 8.4 mg/dL — ABNORMAL LOW (ref 8.9–10.3)
Chloride: 93 mmol/L — ABNORMAL LOW (ref 98–111)
Creatinine, Ser: 2.01 mg/dL — ABNORMAL HIGH (ref 0.44–1.00)
GFR, Estimated: 24 mL/min — ABNORMAL LOW (ref >60)
Glucose, Bld: 95 mg/dL (ref 70–99)
Potassium: 4.8 mmol/L (ref 3.5–5.1)
Sodium: 129 mmol/L — ABNORMAL LOW (ref 135–145)

## 2021-06-18 LAB — URINE CULTURE: Culture: >=100000 — AB

## 2021-06-18 LAB — MAGNESIUM: Magnesium: 2.3 mg/dL (ref 1.7–2.4)

## 2021-06-18 MED ORDER — SODIUM CHLORIDE 0.9 % IV SOLN: INTRAVENOUS | Status: DC

## 2021-06-18 MED ORDER — ONDANSETRON HCL 4 MG/2ML IJ SOLN
4.0000 mg | Freq: Four times a day (QID) | INTRAMUSCULAR | Status: DC | PRN
  Administered 2021-06-18: 4 mg via INTRAVENOUS
  Filled 2021-06-18: qty 2

## 2021-06-18 MED ORDER — PNEUMOCOCCAL 20-VAL CONJ VACC 0.5 ML IM SUSY
0.5000 mL | PREFILLED_SYRINGE | INTRAMUSCULAR | Status: AC
  Administered 2021-06-19: 0.5 mL via INTRAMUSCULAR
  Filled 2021-06-18 (×2): qty 0.5

## 2021-06-18 MED ORDER — CIPROFLOXACIN HCL 500 MG PO TABS
500.0000 mg | ORAL_TABLET | Freq: Two times a day (BID) | ORAL | Status: DC
  Administered 2021-06-18: 500 mg via ORAL
  Filled 2021-06-18: qty 1

## 2021-06-18 MED ORDER — SODIUM CHLORIDE 0.9 % IV SOLN
1.0000 g | INTRAVENOUS | Status: DC
  Administered 2021-06-18: 1 g via INTRAVENOUS
  Filled 2021-06-18: qty 10

## 2021-06-18 MED ORDER — SODIUM CHLORIDE 0.9 % IV SOLN
12.5000 mg | Freq: Four times a day (QID) | INTRAVENOUS | Status: DC | PRN
  Filled 2021-06-18 (×2): qty 0.5

## 2021-06-18 NOTE — Progress Notes (Signed)
?PROGRESS NOTE ? ? ? Katrina Chen  ZOX:[id number] DOB: 1935-05-15 DOA: 06/16/2021 ?PCP: Wardell Honour, FNP  ? ? ?Brief Narrative:  ?Patient presents to emergency department today, 06/16/2021, chief complaint of generalized body aches and vague chest pain, generalized weakness, and inability to stand/ambulate on her own.  History of dementia, daughter notes no increased confusion/altered mental status.  Patient denies fever, vomiting, diarrhea.  Reports may be some increased urinary frequency but not sure how long this has been going on. ?ED course 06/16/2021: Afebrile, vitals normal other than slight elevated blood pressure, normal WBC.  Stable anemia with hemoglobin 10.9, stable creatinine/CKD 3 with creatinine 1.33 and GFR 39, potassium slightly elevated at 5.2.  Urinalysis demonstrated large leukocytes, greater than 50 K WBC.  Urine culture pending.  Troponins normal x2, no concerns on EKG other than A-fib, chest x-ray showed increased cardiac size and right pleural effusion. ?Urine culture with Klebsiella, pansensitive.  Kidney function worsened over interval ? ? ?Assessment & Plan: ?  ?Principal Problem: ?  Acute UTI ?Active Problems: ?  Nonspecific chest pain ?  CAD (coronary artery disease) ?  Hyperkalemia ?  History of bipolar disorder ?  Atrial fibrillation, chronic (HCC) ?  CKD (chronic kidney disease), stage III (HCC) ?  Weakness ?  Protein-calorie malnutrition (HCC) ?  Essential hypertension ?  General weakness ?  Pleural effusion w/ enlarged cardiac silhouette on CXR, trace edema ? ?UTI (urinary tract infection) ?Not meeting criteria for sepsis but infection may be contributing to generalized weakness in frail patient ?Urine culture with Klebsiella, pansensitive ?Plan: ?DC IV Rocephin ?Start p.o. ciprofloxacin per sensitivities ?Monitor vitals and fever curve ?  ?General weakness ?Likely d/t deconditioning/debility, exacerbated by UTI ?Ambulatory dysfunction ?Plan: ?Therapy evaluations, no follow-up  recommended ?Can return to previous skilled nursing facility ?  ?Atrial fibrillation, chronic (HCC) ?continue Eliquis, beta blocker  ?  ?AKI on CKD stage III ?Creatinine worsened over interval ?Hold home ACE inhibitor ?Check UA and renal ultrasound ?Avoid nonessential nephrotoxins ?  ?Hyperkalemia ?Mild ? ?  ?History of bipolar disorder ?Stable on current medications ?Plan:  ?Continue home depakote, zyprexa, effexor ?  ?Pleural effusion w/ enlarged cardiac silhouette on CXR, trace edema ?Volume status near euvolemic to hypovolemic ?Plan: ?Repeat TTE, no evidence of CHF ?Strict I's and O's ?Hold Lasix for now given AKI ? ? ?DVT prophylaxis: Apixaban ?Code Status: DNR ?Family Communication: Daughter Bridgett Larsson [phone] on 4/7 ?Disposition Plan: Status is: Inpatient ?Remains inpatient appropriate because: Acute UTI.  Multifactorial weakness.  AKI on CKD, anticipate discharge 4/9 ? ?Level of care: Med-Surg ? ?Consultants:  ?None ? ?Procedures:  ?None ? ?Antimicrobials: ?Ceftriaxone ? ? ?Subjective: ?Seen and examined.  Resting in bed.  No visible distress.  Answers all questions appropriately.  No pain complaints.  Does endorse weakness and diffuse body aches. ? ?Objective: ?Vitals:  ? 06/17/21 1804 06/17/21 1955 06/18/21 0252 06/18/21 5621  ?BP: (!) 110/42 (!) 116/42 (!) 99/37 (!) 104/38  ?Pulse: 74 60 (!) 58 (!) 58  ?Resp: 16 18 18 16   ?Temp: 98.6 ?F (37 ?C) 98.6 ?F (37 ?C) 98.9 ?F (37.2 ?C) 99.5 ?F (37.5 ?C)  ?TempSrc:  Oral Oral Oral  ?SpO2: 100% 94% 97% 95%  ?Weight:      ?Height:      ? ? ?Intake/Output Summary (Last 24 hours) at 06/18/2021 1143 ?Last data filed at 06/18/2021 1025 ?Gross per 24 hour  ?Intake 483.04 ml  ?Output 0 ml  ?Net 483.04 ml  ? ?Filed Weights  ?  06/16/21 1101  ?Weight: 95.3 kg  ? ? ?Examination: ? ?General exam: No acute distress.  Frail-appearing.  Weak voice ?Respiratory system: Lungs clear.  Normal work of breathing.  Room air ?Cardiovascular system: S1-S2, RRR, no murmurs, no pedal  edema ?Gastrointestinal system: Soft, NT/ND, normal bowel sounds ?Central nervous system: Alert and oriented. No focal neurological deficits. ?Extremities: Symmetric 5 x 5 power. ?Skin: No rashes, lesions or ulcers ?Psychiatry: Judgement and insight appear normal. Mood & affect appropriate.  ? ? ? ?Data Reviewed: I have personally reviewed following labs and imaging studies ? ?CBC: ?Recent Labs  ?Lab 06/16/21 ?1139 06/18/21 ?[id number]  ?WBC 5.2 6.6  ?NEUTROABS  --  3.5  ?HGB 10.9* 9.9*  ?HCT 37.9 33.7*  ?MCV 79.1* 77.8*  ?PLT 258 248  ? ?Basic Metabolic Panel: ?Recent Labs  ?Lab 06/16/21 ?1139 06/17/21 ?0610 06/18/21 ?[id number]  ?NA 135 137 129*  ?K 5.2* 4.6 4.8  ?CL 99 99 93*  ?CO2 27 29 29   ?GLUCOSE 73 111* 95  ?BUN 20 22 36*  ?CREATININE 1.33* 1.57* 2.01*  ?CALCIUM 9.3 8.9 8.4*  ?MG  --   --  2.3  ? ?GFR: ?Estimated Creatinine Clearance (by C-G formula based on SCr of 2.01 mg/dL (H)) ?Female: 24.7 mL/min (A) ?Female: 30.1 mL/min (A) ?Liver Function Tests: ?Recent Labs  ?Lab 06/16/21 ?1139 06/17/21 ?0610  ?AST 29 16  ?ALT 13 10  ?ALKPHOS 84 74  ?BILITOT 0.6 0.4  ?PROT 7.3 6.6  ?ALBUMIN 3.2* 3.0*  ? ?No results for input(s): LIPASE, AMYLASE in the last 168 hours. ?No results for input(s): AMMONIA in the last 168 hours. ?Coagulation Profile: ?No results for input(s): INR, PROTIME in the last 168 hours. ?Cardiac Enzymes: ?Recent Labs  ?Lab 06/16/21 ?1139  ?CKTOTAL 132  ? ?BNP (last 3 results) ?No results for input(s): PROBNP in the last 8760 hours. ?HbA1C: ?No results for input(s): HGBA1C in the last 72 hours. ?CBG: ?No results for input(s): GLUCAP in the last 168 hours. ?Lipid Profile: ?No results for input(s): CHOL, HDL, LDLCALC, TRIG, CHOLHDL, LDLDIRECT in the last 72 hours. ?Thyroid Function Tests: ?No results for input(s): TSH, T4TOTAL, FREET4, T3FREE, THYROIDAB in the last 72 hours. ?Anemia Panel: ?No results for input(s): VITAMINB12, FOLATE, FERRITIN, TIBC, IRON, RETICCTPCT in the last 72 hours. ?Sepsis Labs: ?No results  for input(s): PROCALCITON, LATICACIDVEN in the last 168 hours. ? ?Recent Results (from the past 240 hour(s))  ?Urine Culture     Status: Abnormal  ? Collection Time: 06/16/21 11:22 AM  ? Specimen: Urine, Clean Catch  ?Result Value Ref Range Status  ? Specimen Description   Final  ?  URINE, CLEAN CATCH ?Performed at [facility], [address], [place], [place] [address] ?  ? Special Requests   Final  ?  NONE ?Performed at [facility], [address] [place], [place] [address] ?  ? Culture >=100,000 COLONIES/mL KLEBSIELLA PNEUMONIAE (A)  Final  ? Report Status 06/18/2021 FINAL  Final  ? Organism ID, Bacteria KLEBSIELLA PNEUMONIAE (A)  Final  ?    Susceptibility  ? Klebsiella pneumoniae - MIC*  ?  AMPICILLIN RESISTANT Resistant   ?  CEFAZOLIN <=4 SENSITIVE Sensitive   ?  CEFEPIME <=0.12 SENSITIVE Sensitive   ?  CEFTRIAXONE <=0.25 SENSITIVE Sensitive   ?  CIPROFLOXACIN <=0.25 SENSITIVE Sensitive   ?  GENTAMICIN <=1 SENSITIVE Sensitive   ?  IMIPENEM <=0.25 SENSITIVE Sensitive   ?  NITROFURANTOIN 128 RESISTANT Resistant   ?  TRIMETH/SULFA <=20  SENSITIVE Sensitive   ?  AMPICILLIN/SULBACTAM 4 SENSITIVE Sensitive   ?  PIP/TAZO <=4 SENSITIVE Sensitive   ?  * >=100,000 COLONIES/mL KLEBSIELLA PNEUMONIAE  ?Resp Panel by RT-PCR (Flu A&B, Covid) Nasopharyngeal Swab     Status: None  ? Collection Time: 06/16/21 12:04 PM  ? Specimen: Nasopharyngeal Swab; Nasopharyngeal(NP) swabs in vial transport medium  ?Result Value Ref Range Status  ? SARS Coronavirus 2 by RT PCR NEGATIVE NEGATIVE Final  ?  Comment: (NOTE) ?SARS-CoV-2 target nucleic acids are NOT DETECTED. ? ?The SARS-CoV-2 RNA is generally detectable in upper respiratory ?specimens during the acute phase of infection. The lowest ?concentration of SARS-CoV-2 viral copies this assay can detect is ?138 copies/mL. A negative result does not preclude SARS-Cov-2 ?infection and should not be used as the sole basis for treatment or ?other patient management  decisions. A negative result may occur with  ?improper specimen collection/handling, submission of specimen other ?than nasopharyngeal swab, presence of viral mutation(s) within the ?areas targeted by this assay,

## 2021-06-18 NOTE — Progress Notes (Signed)
?   06/17/21 1604  ?Assess: MEWS Score  ?Temp 99.2 ?F (37.3 ?C)  ?BP (!) 104/51  ?Pulse Rate (!) 115  ?Resp 20  ?SpO2 94 %  ?O2 Device Room Air  ?Assess: MEWS Score  ?MEWS Temp 0  ?MEWS Systolic 0  ?MEWS Pulse 2  ?MEWS RR 0  ?MEWS LOC 0  ?MEWS Score 2  ?MEWS Score Color Yellow  ?Assess: if the MEWS score is Yellow or Red  ?Were vital signs taken at a resting state? Yes  ?Focused Assessment No change from prior assessment  ?Does the patient meet 2 or more of the SIRS criteria? No  ?MEWS guidelines implemented *See Row Information* Yes  ?Treat  ?MEWS Interventions Administered scheduled meds/treatments  ?Pain Scale 0-10  ?Pain Score 0  ?Take Vital Signs  ?Increase Vital Sign Frequency  Yellow: Q 2hr X 2 then Q 4hr X 2, if remains yellow, continue Q 4hrs  ?Escalate  ?MEWS: Escalate Yellow: discuss with charge nurse/RN and consider discussing with provider and RRT  ?Notify: Charge Nurse/RN  ?Name of Charge Nurse/RN Notified Aquilla Hacker, RN  ?Date Charge Nurse/RN Notified 06/17/21  ?Time Charge Nurse/RN Notified 1752  ?Assess: SIRS CRITERIA  ?SIRS Temperature  0  ?SIRS Pulse 1  ?SIRS Respirations  0  ?SIRS WBC 0  ?SIRS Score Sum  1  ? ? ?

## 2021-06-18 NOTE — TOC Progression Note (Addendum)
Transition of Care (TOC) - Progression Note  ? ? ?Patient Details  ?Name: Katrina Chen ?MRN: [id number] ?Date of Birth: 12-30-1935 ? ?Transition of Care (TOC) CM/SW Contact  ?Teagen Bucio E Debra Colon, LCSW ?Phone Number: ?06/18/2021, 10:41 AM ? ?Clinical Narrative:    ?Attempted to reach out to [name] to inquire if patient can still come back there today (if medically ready). Awaiting reply.  ? ?11:30- Per MD, patient not medically ready today, maybe tomorrow. CSW asked [name]. Still awaiting reply.  ? ?12:40- Per [name], [facility] cannot take patient tomorrow due to their pharmacy being closed. [name] is aware plan for DC to [facility] on Monday. TOC handoff updated. RN and MD updated.  ? ?Expected Discharge Plan: Skilled Nursing Facility ?Barriers to Discharge: Continued Medical Work up ? ?Expected Discharge Plan and Services ?Expected Discharge Plan: Skilled Nursing Facility ?  ?  ?Post Acute Care Choice: Resumption of Svcs/PTA Provider ?Living arrangements for the past 2 months: Skilled Nursing Facility ?                ?  ?  ?  ?  ?  ?  ?  ?  ?  ?  ? ? ?Social Determinants of Health (SDOH) Interventions ?  ? ?Readmission Risk Interventions ?   ? View : No data to display.  ?  ?  ?  ? ? ?

## 2021-06-19 DIAGNOSIS — N390 Urinary tract infection, site not specified: Secondary | ICD-10-CM | POA: Diagnosis not present

## 2021-06-19 LAB — BASIC METABOLIC PANEL
Anion gap: 5 (ref 5–15)
BUN: 31 mg/dL — ABNORMAL HIGH (ref 8–23)
CO2: 29 mmol/L (ref 22–32)
Calcium: 8.7 mg/dL — ABNORMAL LOW (ref 8.9–10.3)
Chloride: 97 mmol/L — ABNORMAL LOW (ref 98–111)
Creatinine, Ser: 1.44 mg/dL — ABNORMAL HIGH (ref 0.44–1.00)
GFR, Estimated: 36 mL/min — ABNORMAL LOW (ref >60)
Glucose, Bld: 90 mg/dL (ref 70–99)
Potassium: 4.4 mmol/L (ref 3.5–5.1)
Sodium: 131 mmol/L — ABNORMAL LOW (ref 135–145)

## 2021-06-19 MED ORDER — METHOCARBAMOL 500 MG PO TABS
500.0000 mg | ORAL_TABLET | Freq: Three times a day (TID) | ORAL | Status: DC | PRN
  Administered 2021-06-19 (×2): 500 mg via ORAL
  Filled 2021-06-19 (×2): qty 1

## 2021-06-19 MED ORDER — SODIUM CHLORIDE 0.9 % IV SOLN
1.0000 g | INTRAVENOUS | Status: AC
  Administered 2021-06-19: 1 g via INTRAVENOUS
  Filled 2021-06-19: qty 10

## 2021-06-19 NOTE — TOC Progression Note (Signed)
Transition of Care (TOC) - Progression Note  ? ? ?Patient Details  ?Name: Katrina Chen ?MRN: [id number] ?Date of Birth: 07/31/1935 ? ?Transition of Care (TOC) CM/SW Contact  ?Sebron Mcmahill E Faustino Luecke, LCSW ?Phone Number: ?06/19/2021, 10:19 AM ? ?Clinical Narrative:   Sent messaged to [name] to remind her patient is medically ready for DC, so plan for DC to [facility] tomorrow morning since their pharmacy is closed today.  ? ? ? ?Expected Discharge Plan: Skilled Nursing Facility ?Barriers to Discharge: Continued Medical Work up ? ?Expected Discharge Plan and Services ?Expected Discharge Plan: Skilled Nursing Facility ?  ?  ?Post Acute Care Choice: Resumption of Svcs/PTA Provider ?Living arrangements for the past 2 months: Skilled Nursing Facility ?                ?  ?  ?  ?  ?  ?  ?  ?  ?  ?  ? ? ?Social Determinants of Health (SDOH) Interventions ?  ? ?Readmission Risk Interventions ?   ? View : No data to display.  ?  ?  ?  ? ? ?

## 2021-06-19 NOTE — Progress Notes (Signed)
?PROGRESS NOTE ? ? ? Katrina Chen  [id number] DOB: 26-Apr-1935 DOA: 06/16/2021 ?PCP: Cephas Darby, FNP  ? ? ?Brief Narrative:  ?Patient presents to emergency department today, 06/16/2021, chief complaint of generalized body aches and vague chest pain, generalized weakness, and inability to stand/ambulate on her own.  History of dementia, daughter notes no increased confusion/altered mental status.  Patient denies fever, vomiting, diarrhea.  Reports may be some increased urinary frequency but not sure how long this has been going on. ?ED course 06/16/2021: Afebrile, vitals normal other than slight elevated blood pressure, normal WBC.  Stable anemia with hemoglobin 10.9, stable creatinine/CKD 3 with creatinine 1.33 and GFR 39, potassium slightly elevated at 5.2.  Urinalysis demonstrated large leukocytes, greater than 50 K WBC.  Urine culture pending.  Troponins normal x2, no concerns on EKG other than A-fib, chest x-ray showed increased cardiac size and right pleural effusion. ?Urine culture with Klebsiella, pansensitive.  Kidney function worsened over interval ?Kidney function now recovered and approaching baseline as of 4/9 ? ? ?Assessment & Plan: ?  ?Principal Problem: ?  Acute UTI ?Active Problems: ?  Nonspecific chest pain ?  CAD (coronary artery disease) ?  Hyperkalemia ?  History of bipolar disorder ?  Atrial fibrillation, chronic ([place]) ?  CKD (chronic kidney disease), stage III ([place]) ?  Weakness ?  Protein-calorie malnutrition ([place]) ?  Essential hypertension ?  General weakness ?  Pleural effusion w/ enlarged cardiac silhouette on CXR, trace edema ? ?UTI (urinary tract infection) ?Not meeting criteria for sepsis but infection may be contributing to generalized weakness in frail patient ?Urine culture with Klebsiella, pansensitive ?Seem to have adverse reaction to de-escalation to ciprofloxacin ?Plan: ?Continue IV Rocephin, dose 3/3 today ?Monitor vitals and fever curve ?  ?General weakness ?Likely d/t  deconditioning/debility, exacerbated by UTI ?Ambulatory dysfunction ?Plan: ?Therapy evaluations, no follow-up recommended ?Can return to previous skilled nursing facility ?  ?Atrial fibrillation, chronic ([place]) ?continue Eliquis, beta blocker  ?  ?AKI on CKD stage III ?Now recovered ?Renal ultrasound reassuring ?Plan: ?Hold home ACE inhibitor for today, can likely restart tomorrow ?Avoid nonessential nephrotoxins ?  ?Hyperkalemia ?Mild ? ?  ?History of bipolar disorder ?Stable on current medications ?Plan:  ?Continue home depakote, zyprexa, effexor ?  ?Pleural effusion w/ enlarged cardiac silhouette on CXR, trace edema ?Volume status near euvolemic to hypovolemic ?Repeat TTE with normal ejection fraction and no diastolic dysfunction ?Plan: ?Hold Lasix for today, can restart tomorrow ?Strict I's and O's ?Hold Lasix for now given AKI ? ? ?DVT prophylaxis: Apixaban ?Code Status: DNR ?Family Communication: Daughter [name] [phone] on 4/9 ?Disposition Plan: Status is: Inpatient ?Remains inpatient appropriate because: Unsafe discharge plan.  Patient medically stable for discharge ? ?Level of care: Med-Surg ? ?Consultants:  ?None ? ?Procedures:  ?None ? ?Antimicrobials: ?Ceftriaxone ? ? ?Subjective: ?Seen and examined.  Resting in bed.  No visible distress.  Answers all questions appropriately.  No pain complaints.  Does endorse weakness and diffuse body aches. ? ?Objective: ?Vitals:  ? 06/18/21 1635 06/18/21 1952 06/19/21 0515 06/19/21 0743  ?BP: (!) 125/44 (!) 147/75 (!) 142/57 (!) 139/56  ?Pulse: 65 70 60 (!) 59  ?Resp: 18 16 17 18   ?Temp: 98.7 ?F (37.1 ?C)  97.8 ?F (36.6 ?C) 98.4 ?F (36.9 ?C)  ?TempSrc: Oral  Oral   ?SpO2: 98% 100% 100% 99%  ?Weight:      ?Height:      ? ? ?Intake/Output Summary (Last 24 hours) at 06/19/2021 1014 ?Last data  filed at 06/19/2021 0700 ?Gross per 24 hour  ?Intake 1438.06 ml  ?Output 350 ml  ?Net 1088.06 ml  ? ?Filed Weights  ? 06/16/21 1101  ?Weight: 95.3 kg   ? ? ?Examination: ? ?General exam: NAD ?Respiratory system: Lungs clear.  Normal work of breathing.  Room air ?Cardiovascular system: S1-S2, RRR, no murmurs, no pedal edema ?Gastrointestinal system: Soft, NT/ND, normal bowel sounds ?Central nervous system: Alert and oriented. No focal neurological deficits. ?Extremities: Symmetric 5 x 5 power. ?Skin: No rashes, lesions or ulcers ?Psychiatry: Judgement and insight appear normal. Mood & affect appropriate.  ? ? ? ?Data Reviewed: I have personally reviewed following labs and imaging studies ? ?CBC: ?Recent Labs  ?Lab 06/16/21 ?1139 06/18/21 ?[id number]  ?WBC 5.2 6.6  ?NEUTROABS  --  3.5  ?HGB 10.9* 9.9*  ?HCT 37.9 33.7*  ?MCV 79.1* 77.8*  ?PLT 258 248  ? ?Basic Metabolic Panel: ?Recent Labs  ?Lab 06/16/21 ?1139 06/17/21 ?0610 06/18/21 ?[id number] 06/19/21 ?0739  ?NA 135 137 129* 131*  ?K 5.2* 4.6 4.8 4.4  ?CL 99 99 93* 97*  ?CO2 27 29 29 29   ?GLUCOSE 73 111* 95 90  ?BUN 20 22 36* 31*  ?CREATININE 1.33* 1.57* 2.01* 1.44*  ?CALCIUM 9.3 8.9 8.4* 8.7*  ?MG  --   --  2.3  --   ? ?GFR: ?Estimated Creatinine Clearance (by C-G formula based on SCr of 1.44 mg/dL (H)) ?Female: 34.5 mL/min (A) ?Female: 42 mL/min (A) ?Liver Function Tests: ?Recent Labs  ?Lab 06/16/21 ?1139 06/17/21 ?0610  ?AST 29 16  ?ALT 13 10  ?ALKPHOS 84 74  ?BILITOT 0.6 0.4  ?PROT 7.3 6.6  ?ALBUMIN 3.2* 3.0*  ? ?No results for input(s): LIPASE, AMYLASE in the last 168 hours. ?No results for input(s): AMMONIA in the last 168 hours. ?Coagulation Profile: ?No results for input(s): INR, PROTIME in the last 168 hours. ?Cardiac Enzymes: ?Recent Labs  ?Lab 06/16/21 ?1139  ?CKTOTAL 132  ? ?BNP (last 3 results) ?No results for input(s): PROBNP in the last 8760 hours. ?HbA1C: ?No results for input(s): HGBA1C in the last 72 hours. ?CBG: ?No results for input(s): GLUCAP in the last 168 hours. ?Lipid Profile: ?No results for input(s): CHOL, HDL, LDLCALC, TRIG, CHOLHDL, LDLDIRECT in the last 72 hours. ?Thyroid Function Tests: ?No  results for input(s): TSH, T4TOTAL, FREET4, T3FREE, THYROIDAB in the last 72 hours. ?Anemia Panel: ?No results for input(s): VITAMINB12, FOLATE, FERRITIN, TIBC, IRON, RETICCTPCT in the last 72 hours. ?Sepsis Labs: ?No results for input(s): PROCALCITON, LATICACIDVEN in the last 168 hours. ? ?Recent Results (from the past 240 hour(s))  ?Urine Culture     Status: Abnormal  ? Collection Time: 06/16/21 11:22 AM  ? Specimen: Urine, Clean Catch  ?Result Value Ref Range Status  ? Specimen Description   Final  ?  URINE, CLEAN CATCH ?Performed at [facility], [address], [place], [place] [address] ?  ? Special Requests   Final  ?  NONE ?Performed at [facility], [address] [place], [place] [address] ?  ? Culture >=100,000 COLONIES/mL KLEBSIELLA PNEUMONIAE (A)  Final  ? Report Status 06/18/2021 FINAL  Final  ? Organism ID, Bacteria KLEBSIELLA PNEUMONIAE (A)  Final  ?    Susceptibility  ? Klebsiella pneumoniae - MIC*  ?  AMPICILLIN RESISTANT Resistant   ?  CEFAZOLIN <=4 SENSITIVE Sensitive   ?  CEFEPIME <=0.12 SENSITIVE Sensitive   ?  CEFTRIAXONE <=0.25 SENSITIVE Sensitive   ?  CIPROFLOXACIN <=0.25  SENSITIVE Sensitive   ?  GENTAMICIN <=1 SENSITIVE Sensitive   ?  IMIPENEM <=0.25 SENSITIVE Sensitive   ?  NITROFURANTOIN 128 RESISTANT Resistant   ?  TRIMETH/SULFA <=20 SENSITIVE Sensitive   ?  AMPICILLIN/SULBACTAM 4 SENSITIVE Sensitive   ?  PIP/TAZO <=4 SENSITIVE Sensitive   ?  * >=100,000 COLONIES/mL KLEBSIELLA PNEUMONIAE  ?Resp Panel by RT-PCR (Flu A&B, Covid) Nasopharyngeal Swab     Status: None  ? Collection Time: 06/16/21 12:04 PM  ? Specimen: Nasopharyngeal Swab; Nasopharyngeal(NP) swabs in vial transport medium  ?Result Value Ref Range Status  ? SARS Coronavirus 2 by RT PCR NEGATIVE NEGATIVE Final  ?  Comment: (NOTE) ?SARS-CoV-2 target nucleic acids are NOT DETECTED. ? ?The SARS-CoV-2 RNA is generally detectable in upper respiratory ?specimens during the acute phase of infection. The  lowest ?concentration of SARS-CoV-2 viral copies this assay can detect is ?138 copies/mL. A negative result does not preclude SARS-Cov-2 ?infection and should not be used as the sole basis for treatment or ?other patient management decisions. A neg

## 2021-06-20 DIAGNOSIS — N390 Urinary tract infection, site not specified: Secondary | ICD-10-CM | POA: Diagnosis not present

## 2021-06-20 LAB — URINALYSIS, COMPLETE (UACMP) WITH MICROSCOPIC
Bacteria, UA: NONE SEEN
Bilirubin Urine: NEGATIVE
Glucose, UA: NEGATIVE mg/dL
Hgb urine dipstick: NEGATIVE
Ketones, ur: NEGATIVE mg/dL
Leukocytes,Ua: NEGATIVE
Nitrite: NEGATIVE
Protein, ur: NEGATIVE mg/dL
Specific Gravity, Urine: 1.004 — ABNORMAL LOW (ref 1.005–1.030)
pH: 6 (ref 5.0–8.0)

## 2021-06-20 MED ORDER — LIDOCAINE 5 % EX PTCH: 1.0000 | MEDICATED_PATCH | CUTANEOUS | 0 refills | Status: DC

## 2021-06-20 MED ORDER — OXYBUTYNIN CHLORIDE ER 5 MG PO TB24: 5.0000 mg | ORAL_TABLET | Freq: Two times a day (BID) | ORAL | Status: DC | PRN

## 2021-06-20 MED ORDER — TRAMADOL HCL 50 MG PO TABS: 50.0000 mg | ORAL_TABLET | Freq: Four times a day (QID) | ORAL | 0 refills | Status: AC | PRN

## 2021-06-20 MED ORDER — METHOCARBAMOL 500 MG PO TABS: 500.0000 mg | ORAL_TABLET | Freq: Three times a day (TID) | ORAL | Status: DC | PRN

## 2021-06-20 NOTE — Discharge Summary (Addendum)
Physician Discharge Summary  ?Inge Doenges [id number] DOB: [birth year] DOA: 06/16/2021 ? ?PCP: Cephas Darby, FNP ? ?Admit date: 06/16/2021 ?Discharge date: 06/20/2021 ? ?Admitted From: SNF ?Disposition:  SNF ? ?Recommendations for Outpatient Follow-up:  ?Follow up with PCP in 1-2 weeks ? ? ?Home Health:No ?Equipment/Devices:None  ? ?Discharge Condition:Stable ?CODE STATUS:Full  ?Diet recommendation: Dysphagia 3 ? ?Brief/Interim Summary: ?Patient presents to emergency department today, 06/16/2021, chief complaint of generalized body aches and vague chest pain, generalized weakness, and inability to stand/ambulate on her own.  History of dementia, daughter notes no increased confusion/altered mental status.  Patient denies fever, vomiting, diarrhea.  Reports may be some increased urinary frequency but not sure how long this has been going on. ?ED course 06/16/2021: Afebrile, vitals normal other than slight elevated blood pressure, normal WBC.  Stable anemia with hemoglobin 10.9, stable creatinine/CKD 3 with creatinine 1.33 and GFR 39, potassium slightly elevated at 5.2.  Urinalysis demonstrated large leukocytes, greater than 50 K WBC.  Urine culture pending.  Troponins normal x2, no concerns on EKG other than A-fib, chest x-ray showed increased cardiac size and right pleural effusion. ?Urine culture with Klebsiella, pansensitive.  Kidney function worsened over interval ?Kidney function now recovered and approaching baseline as of 4/9 ?Stable for dc.  No abx indicated on discharge ? ? ?Discharge Diagnoses:  ?Principal Problem: ?  Acute UTI ?Active Problems: ?  Nonspecific chest pain ?  CAD (coronary artery disease) ?  Hyperkalemia ?  History of bipolar disorder ?  Atrial fibrillation, chronic ([place]) ?  CKD (chronic kidney disease), stage III ([place]) ?  Weakness ?  Protein-calorie malnutrition ([place]) ?  Essential hypertension ?  General weakness ?  Pleural effusion w/ enlarged cardiac silhouette on CXR, trace edema ? ?UTI  (urinary tract infection) ?Not meeting criteria for sepsis but infection may be contributing to generalized weakness in frail patient ?Urine culture with Klebsiella, pansensitive ?Seem to have adverse reaction to de-escalation to ciprofloxacin ?Plan: ?Completed course of abx as inpatient. ?No abx on dc ?  ?General weakness ?Likely d/t deconditioning/debility, exacerbated by UTI ?Ambulatory dysfunction ?Plan: ?Therapy evaluations, no follow-up recommended ?Can return to previous skilled nursing facility ?  ?Atrial fibrillation, chronic ([place]) ?continue Eliquis, beta blocker  ?  ?AKI on CKD stage III ?Now recovered ?Renal ultrasound reassuring ?Plan: ?Restart ACEi on dc ?Avoid nonessential nephrotoxins ?  ?Hyperkalemia ?Mild ?  ?  ?History of bipolar disorder ?Stable on current medications ?Plan:  ?Continue home depakote, zyprexa, effexor ?  ?Pleural effusion w/ enlarged cardiac silhouette on CXR, trace edema ?Volume status near euvolemic to hypovolemic ?Repeat TTE with normal ejection fraction and no diastolic dysfunction ?Plan: ?Does not require standing diuretic.  Can follow up with cardiology prn ? ?Discharge Instructions ? ?Discharge Instructions   ? ? Diet - low sodium heart healthy   Complete by: As directed ?  ? Increase activity slowly   Complete by: As directed ?  ? ?  ? ?Allergies as of 06/20/2021   ? ?   Reactions  ? Iodine Swelling  ? Amlodipine Other (See Comments)  ? Nifedipine Swelling, Other (See Comments)  ? Lisinopril Swelling  ? Quinapril Swelling  ? Shellfish Allergy   ? Simvastatin Swelling  ? ?  ? ?  ?Medication List  ?  ? ?STOP taking these medications   ? ?cetirizine 5 MG tablet ?Commonly known as: ZYRTEC ?  ? ?  ? ?TAKE these medications   ? ?bisacodyl 10 MG suppository ?Commonly known as: DULCOLAX ?Place  1 suppository (10 mg total) rectally daily as needed for mild constipation. ?  ?carvedilol 25 MG tablet ?Commonly known as: COREG ?Take 25 mg by mouth 2 (two) times daily. ?  ?celecoxib 100 MG  capsule ?Commonly known as: CELEBREX ?Take 1 capsule (100 mg total) by mouth 2 (two) times daily as needed. ?  ?cloNIDine 0.2 mg/24hr patch ?Commonly known as: CATAPRES - Dosed in mg/24 hr ?Place 0.2 mg onto the skin once a week. ?  ?cycloSPORINE 0.05 % ophthalmic emulsion ?Commonly known as: RESTASIS ?Place 1 drop into both eyes 2 (two) times daily. ?  ?divalproex 125 MG capsule ?Commonly known as: DEPAKOTE SPRINKLE ?Take 125 mg by mouth 2 (two) times daily. ?  ?Eliquis 2.5 MG Tabs tablet ?Generic drug: apixaban ?Take 2.5 mg by mouth 2 (two) times daily. ?  ?Flonase Allergy Relief 50 MCG/ACT nasal spray ?Generic drug: fluticasone ?Place 1 spray into both nostrils daily. ?  ?gabapentin 100 MG capsule ?Commonly known as: NEURONTIN ?Take 100 mg by mouth every 8 (eight) hours. ?  ?hydrALAZINE 10 MG tablet ?Commonly known as: APRESOLINE ?Take 10 mg by mouth 3 (three) times daily. ?  ?levocetirizine 5 MG tablet ?Commonly known as: XYZAL ?Take 5 mg by mouth daily. ?  ?lidocaine 5 % ?Commonly known as: LIDODERM ?Place 1 patch onto the skin daily. Remove & Discard patch within 12 hours or as directed by MD ?  ?losartan 100 MG tablet ?Commonly known as: COZAAR ?Take 100 mg by mouth daily. ?  ?melatonin 5 MG Tabs ?Take 10 mg by mouth at bedtime. ?  ?methocarbamol 500 MG tablet ?Commonly known as: ROBAXIN ?Take 1 tablet (500 mg total) by mouth every 8 (eight) hours as needed for muscle spasms. ?  ?OLANZapine 2.5 MG tablet ?Commonly known as: ZYPREXA ?Take 2.5 mg by mouth at bedtime. ?  ?olopatadine 0.1 % ophthalmic solution ?Commonly known as: PATANOL ?Place 1 drop into both eyes 2 (two) times daily. ?  ?oxybutynin 5 MG 24 hr tablet ?Commonly known as: Ditropan XL ?Take 1 tablet (5 mg total) by mouth 2 (two) times daily as needed (bladder spasm). ?  ?polyethylene glycol 17 g packet ?Commonly known as: MIRALAX / GLYCOLAX ?Take 17 g by mouth 2 (two) times daily. ?  ?prazosin 1 MG capsule ?Commonly known as: MINIPRESS ?Take 1 mg  by mouth at bedtime. ?  ?senna-docusate 8.6-50 MG tablet ?Commonly known as: Senokot-S ?Take 1 tablet by mouth 2 (two) times daily. ?  ?tamsulosin 0.4 MG Caps capsule ?Commonly known as: Flomax ?Take 1 capsule (0.4 mg total) by mouth daily. ?  ?traMADol 50 MG tablet ?Commonly known as: ULTRAM ?Take 1 tablet (50 mg total) by mouth every 6 (six) hours as needed for up to 3 days. ?What changed: reasons to take this ?  ?venlafaxine XR 37.5 MG 24 hr capsule ?Commonly known as: EFFEXOR-XR ?Take 75 mg by mouth daily. ?  ? ?  ? ? ?Allergies  ?Allergen Reactions  ? Iodine Swelling  ? Amlodipine Other (See Comments)  ? Nifedipine Swelling and Other (See Comments)  ? Lisinopril Swelling  ? Quinapril Swelling  ? Shellfish Allergy   ? Simvastatin Swelling  ? ? ?Consultations: ?None ? ? ?Procedures/Studies: ?DG Chest 2 View ? ?Result Date: 06/16/2021 ?CLINICAL DATA:  Generalized abdominal pain, some chest pain EXAM: CHEST - 2 VIEW COMPARISON:  11/05/2020 FINDINGS: Enlargement of cardiac silhouette with leadless pacemaker projecting over RIGHT ventricle. Mediastinal contours and pulmonary vascularity normal. Atherosclerotic calcification aorta. Mild RIGHT basilar  atelectasis and small RIGHT pleural effusion. Remaining lungs clear. No pneumothorax or acute infiltrate identified. Osseous structures unremarkable. IMPRESSION: Enlargement of cardiac silhouette post pacemaker. Mild RIGHT pleural effusion and basilar atelectasis. Electronically Signed   By: Lavonia Dana M.D.   On: 06/16/2021 12:17  ? ?DG Abd 1 View ? ?Result Date: 06/18/2021 ?CLINICAL DATA:  Nausea and vomiting. EXAM: ABDOMEN - 1 VIEW COMPARISON:  11/07/2020 FINDINGS: Supine abdomen shows no substantial gaseous small bowel dilatation to suggest small bowel obstruction. Air in stool are seen scattered along the length of a mildly distended colon. Bones are diffusely demineralized. Lumbosacral fusion hardware evident. IMPRESSION: Nonobstructive bowel gas pattern. Electronically  Signed   By: Misty Stanley M.D.   On: 06/18/2021 14:52  ? ?US RENAL ? ?Result Date: 06/18/2021 ?CLINICAL DATA:  Acute kidney injury. EXAM: RENAL / URINARY TRACT ULTRASOUND COMPLETE COMPARISON:  None. Cammy Copa

## 2021-06-20 NOTE — TOC Progression Note (Addendum)
Transition of Care (TOC) - Progression Note  ? ? ?Patient Details  ?Name: Katrina Chen ?MRN: [id number] ?Date of Birth: 1935-08-22 ? ?Transition of Care (TOC) CM/SW Contact  ?Margarito Liner, LCSW ?Phone Number: ?06/20/2021, 9:31 AM ? ?Clinical Narrative:  According to [employer], both [name] and [name] are not at the facility today. Unable to reach [name] by phone. Left voicemail for their social worker regarding discharge for today. ? ?11:58 am: Still having difficulty getting in touch with someone at [facility] to facilitate discharge today. ? ?1:30 pm: Updated patient. ? ?Expected Discharge Plan: Skilled Nursing Facility ?Barriers to Discharge: Continued Medical Work up ? ?Expected Discharge Plan and Services ?Expected Discharge Plan: Skilled Nursing Facility ?  ?  ?Post Acute Care Choice: Resumption of Svcs/PTA Provider ?Living arrangements for the past 2 months: Skilled Nursing Facility ?Expected Discharge Date: 06/20/21               ?  ?  ?  ?  ?  ?  ?  ?  ?  ?  ? ? ?Social Determinants of Health (SDOH) Interventions ?  ? ?Readmission Risk Interventions ?   ? View : No data to display.  ?  ?  ?  ? ? ?

## 2021-06-20 NOTE — Care Management (Signed)
Notified by [facility] social worker that no one had been picking up the phone at [facility].  Per TOC they were made aware that the expected date of discharge would be today Monday 4/10. ? ?As such discharge has been delayed.  Patient remains medically stable for discharge and DC orders will stay in place. ? ?Lolita Patella MD ?

## 2021-06-20 NOTE — Care Management Important Message (Signed)
Important Message ? ?Patient Details  ?Name: Katrina Chen ?MRN: [id number] ?Date of Birth: 1936-01-24 ? ? ?Medicare Important Message Given:  Yes ? ? ? ? ?Dannette Barbara ?06/20/2021, 12:06 PM ?

## 2021-06-20 NOTE — TOC Transition Note (Signed)
Transition of Care (TOC) - CM/SW Discharge Note ? ? ?Patient Details  ?Name: Katrina Chen ?MRN: [id number] ?Date of Birth: [birth year] ? ?Transition of Care (TOC) CM/SW Contact:  ?Candie Chroman, LCSW ?Phone Number: ?06/20/2021, 4:17 PM ? ? ?Clinical Narrative: Patient has orders to discharge to [facility] today. RN has already called report. EMS transport has been arranged and she is next on the list. No further concerns. CSW signing off.   ? ?Final next level of care: [place] ?Barriers to Discharge: Barriers Resolved ? ? ?Patient Goals and CMS Choice ?  ?  ?Choice offered to / list presented to : NA ? ?Discharge Placement ?  ?Existing PASRR number confirmed : 06/17/21          ?Patient chooses bed at: [facility] ?Patient to be transferred to facility by: EMS ?Name of family member notified: [name] ?Patient and family notified of of transfer: 06/20/21 ? ?Discharge Plan and Services ?  ?  ?Post Acute Care Choice: Resumption of Svcs/PTA Provider          ?  ?  ?  ?  ?  ?  ?  ?  ?  ?  ? ?Social Determinants of Health (SDOH) Interventions ?  ? ? ?Readmission Risk Interventions ?   ? View : No data to display.  ?  ?  ?  ? ? ? ? ? ?

## 2021-06-26 ENCOUNTER — Emergency Department: Payer: Medicare Other

## 2021-06-26 ENCOUNTER — Emergency Department
Admission: EM | Admit: 2021-06-26 | Discharge: 2021-06-26 | Disposition: A | Discharge status: Home / Self Care | Payer: Medicare Other | Attending: Emergency Medicine | Admitting: Emergency Medicine

## 2021-06-26 DIAGNOSIS — R1013 Epigastric pain: Secondary | ICD-10-CM | POA: Diagnosis not present

## 2021-06-26 DIAGNOSIS — R1310 Dysphagia, unspecified: Secondary | ICD-10-CM | POA: Diagnosis not present

## 2021-06-26 DIAGNOSIS — Z7901 Long term (current) use of anticoagulants: Secondary | ICD-10-CM | POA: Diagnosis not present

## 2021-06-26 DIAGNOSIS — R918 Other nonspecific abnormal finding of lung field: Secondary | ICD-10-CM | POA: Diagnosis not present

## 2021-06-26 DIAGNOSIS — E041 Nontoxic single thyroid nodule: Secondary | ICD-10-CM | POA: Diagnosis not present

## 2021-06-26 DIAGNOSIS — I2510 Atherosclerotic heart disease of native coronary artery without angina pectoris: Secondary | ICD-10-CM | POA: Diagnosis not present

## 2021-06-26 DIAGNOSIS — Z20822 Contact with and (suspected) exposure to covid-19: Secondary | ICD-10-CM | POA: Insufficient documentation

## 2021-06-26 DIAGNOSIS — R1319 Other dysphagia: Secondary | ICD-10-CM

## 2021-06-26 LAB — RESP PANEL BY RT-PCR (FLU A&B, COVID) ARPGX2
Influenza A by PCR: NEGATIVE
Influenza B by PCR: NEGATIVE
SARS Coronavirus 2 by RT PCR: NEGATIVE

## 2021-06-26 LAB — BASIC METABOLIC PANEL
Anion gap: 10 (ref 5–15)
BUN: 19 mg/dL (ref 8–23)
CO2: 25 mmol/L (ref 22–32)
Calcium: 9.3 mg/dL (ref 8.9–10.3)
Chloride: 96 mmol/L — ABNORMAL LOW (ref 98–111)
Creatinine, Ser: 1.24 mg/dL — ABNORMAL HIGH (ref 0.44–1.00)
GFR, Estimated: 43 mL/min — ABNORMAL LOW (ref >60)
Glucose, Bld: 103 mg/dL — ABNORMAL HIGH (ref 70–99)
Potassium: 5.1 mmol/L (ref 3.5–5.1)
Sodium: 131 mmol/L — ABNORMAL LOW (ref 135–145)

## 2021-06-26 LAB — HEPATIC FUNCTION PANEL
ALT: 9 U/L (ref 0–44)
AST: 19 U/L (ref 15–41)
Albumin: 3.1 g/dL — ABNORMAL LOW (ref 3.5–5.0)
Alkaline Phosphatase: 78 U/L (ref 38–126)
Bilirubin, Direct: <0.1 mg/dL (ref 0.0–0.2)
Total Bilirubin: 0.4 mg/dL (ref 0.3–1.2)
Total Protein: 6.8 g/dL (ref 6.5–8.1)

## 2021-06-26 LAB — CBC
HCT: 34.8 % — ABNORMAL LOW (ref 36.0–46.0)
Hemoglobin: 10.1 g/dL — ABNORMAL LOW (ref 12.0–15.0)
MCH: 22.9 pg — ABNORMAL LOW (ref 26.0–34.0)
MCHC: 29 g/dL — ABNORMAL LOW (ref 30.0–36.0)
MCV: 78.9 fL — ABNORMAL LOW (ref 80.0–100.0)
Platelets: 225 10*3/uL (ref 150–400)
RBC: 4.41 MIL/uL (ref 3.87–5.11)
RDW: 15.7 % — ABNORMAL HIGH (ref 11.5–15.5)
WBC: 10.5 10*3/uL (ref 4.0–10.5)
nRBC: 0 % (ref 0.0–0.2)

## 2021-06-26 LAB — TSH: TSH: 2.312 u[IU]/mL (ref 0.350–4.500)

## 2021-06-26 LAB — PROCALCITONIN: Procalcitonin: <0.1 ng/mL

## 2021-06-26 LAB — LIPASE, BLOOD: Lipase: 27 U/L (ref 11–51)

## 2021-06-26 LAB — TROPONIN I (HIGH SENSITIVITY)
Troponin I (High Sensitivity): 9 ng/L (ref <18)
Troponin I (High Sensitivity): 10 ng/L (ref <18)

## 2021-06-26 MED ORDER — DOXYCYCLINE HYCLATE 100 MG PO CAPS: 100.0000 mg | ORAL_CAPSULE | Freq: Two times a day (BID) | ORAL | 0 refills | Status: DC

## 2021-06-26 MED ORDER — ONDANSETRON 4 MG PO TBDP
4.0000 mg | ORAL_TABLET | Freq: Once | ORAL | Status: AC
  Administered 2021-06-26: 4 mg via ORAL
  Filled 2021-06-26: qty 1

## 2021-06-26 MED ORDER — DOXYCYCLINE HYCLATE 100 MG PO TABS
100.0000 mg | ORAL_TABLET | Freq: Once | ORAL | Status: AC
  Administered 2021-06-26: 100 mg via ORAL
  Filled 2021-06-26: qty 1

## 2021-06-26 MED ORDER — LIDOCAINE VISCOUS HCL 2 % MT SOLN
15.0000 mL | Freq: Once | OROMUCOSAL | Status: AC
  Administered 2021-06-26: 15 mL via OROMUCOSAL
  Filled 2021-06-26: qty 15

## 2021-06-26 NOTE — Discharge Instructions (Addendum)
Please seek medical attention for any high fevers, chest pain, shortness of breath, change in behavior, persistent vomiting, bloody stool or any other new or concerning symptoms.  

## 2021-06-26 NOTE — ED Provider Notes (Signed)
? ?[facility] ?Provider Note ? ? ? Event Date/Time  ? First MD Initiated Contact with Patient 06/26/21 1617   ?  (approximate) ? ? ?History  ? ?Dysphagia and Chest Pain ? ? ?HPI ? ?Katrina Chen is a 86 y.o. adult who on review of discharge summary from April 10 of this year was admitted for treatment of weakness with associated urinary tract infection ? ?Past medical history includes coronary artery disease hyperkalemia bipolar disorder chronic atrial fibrillation chronic kidney disease malnutrition history of pleural effusions ? ?Patient was recently discharged from the hospital, currently living at [facility].  She is here with her daughter. ? ?She has been at [facility], and reports her symptoms from her urinary tract infection are much better.  No pain no burning with urination her strength is improved.  However for the last approximately 2 to 3 days she has noticed that when she swallows things like takes her medications or eats food that she will get a feeling like something is stuck or irritated below her breastbone. ? ?It sort of a stuck feeling of feeling like she wants to be able to kind of cough something up but nothing there.  Denies breathing problems.  No chest pain.  Reports its not really painful tomorrow but stuck feeling like something stuck in her esophagus ? ?She is able to eat and drink.  He is not throwing up.  She was able to keep water ginger ale and breakfast including her oatmeal down without difficulty. ? ? ?  ? ? ?Physical Exam  ? ?Triage Vital Signs: ?ED Triage Vitals  ?Enc Vitals Group  ?   BP 06/26/21 1423 (!) 150/63  ?   Pulse Rate 06/26/21 1423 60  ?   Resp 06/26/21 1423 18  ?   Temp 06/26/21 1423 98.7 ?F (37.1 ?C)  ?   Temp Source 06/26/21 1423 Oral  ?   SpO2 06/26/21 1423 97 %  ?   Weight --   ?   Height --   ?   Head Circumference --   ?   [facility] --   ?   Pain Score 06/26/21 1421 8  ?   Pain Loc --   ?   Pain Edu? --   ?   Excl. in [place]? --    ? ? ?Most recent vital signs: ?Vitals:  ? 06/26/21 1730 06/26/21 1800  ?BP: (!) 178/76 (!) 171/74  ?Pulse: (!) 59 (!) 59  ?Resp:  17  ?Temp:    ?SpO2: 97% 99%  ? ? ? ?General: Awake, no distress.  Pleasant.  Fully alert well oriented.  Daughter also at bedside.  Patient does not appear in any active distress or extremis ?CV:  Good peripheral perfusion.  Normal heart tones.  Lung sounds clear bilaterally, slightly diminished in the bases.  Normal work of breathing and respiratory pattern.  Oxygen saturation 99% on room air. ?Resp:  Normal effort.  No noted cough. ?Abd:  No distention.  Abdomen soft nontender nondistended except reports mild tenderness in the epigastrium without rebound or guarding.  Negative Murphy. ?Other:  No difficulty swallowing does not appear to have obvious evidence of any sort of esophageal obstruction.  Managing secretions well. ? ? ?ED Results / Procedures / Treatments  ? ?Labs ?(all labs ordered are listed, but only abnormal results are displayed) ?Labs Reviewed  ?BASIC METABOLIC PANEL - Abnormal; Notable for the following components:  ?    Result Value  ?  Sodium 131 (*)   ? Chloride 96 (*)   ? Glucose, Bld 103 (*)   ? Creatinine, Ser 1.24 (*)   ? GFR, Estimated 43 (*)   ? All other components within normal limits  ?CBC - Abnormal; Notable for the following components:  ? Hemoglobin 10.1 (*)   ? HCT 34.8 (*)   ? MCV 78.9 (*)   ? MCH 22.9 (*)   ? MCHC 29.0 (*)   ? RDW 15.7 (*)   ? All other components within normal limits  ?HEPATIC FUNCTION PANEL - Abnormal; Notable for the following components:  ? Albumin 3.1 (*)   ? All other components within normal limits  ?RESP PANEL BY RT-PCR (FLU A&B, COVID) [id number]  ?LIPASE, BLOOD  ?TSH  ?PROCALCITONIN  ?TROPONIN I (HIGH SENSITIVITY)  ?TROPONIN I (HIGH SENSITIVITY)  ? ? ? ?EKG ? ?Reviewed interpreted by me at 1430 ?Heart rate 60 ?QRS 150 ?QTc 490 ?Paced ventricular rhythm, underlying atrial rhythm suspected A-fib ? ? ?RADIOLOGY ?Patient's chest  x-ray is personally interpreted by me as negative for acute finding.  Noted is a cardiac monitor, or likely pacemaker ? ?CT ABDOMEN PELVIS WO CONTRAST ? ?Result Date: 06/26/2021 ?CLINICAL DATA:  Epigastric pain and difficulty swallowing. EXAM: CT ABDOMEN AND PELVIS WITHOUT CONTRAST TECHNIQUE: Multidetector CT imaging of the abdomen and pelvis was performed following the standard protocol without IV contrast. RADIATION DOSE REDUCTION: This exam was performed according to the departmental dose-optimization program which includes automated exposure control, adjustment of the mA and/or kV according to patient size and/or use of iterative reconstruction technique. COMPARISON:  CT abdomen pelvis dated November 12, 2020. FINDINGS: Lower chest: No acute abnormality. Chronic cardiomegaly, trace bilateral pleural effusions, and bibasilar scarring. Hepatobiliary: No focal liver abnormality is seen. No gallstones, gallbladder wall thickening, or biliary dilatation. Pancreas: Unremarkable. No pancreatic ductal dilatation or surrounding inflammatory changes. Spleen: Normal in size without focal abnormality. Adrenals/Urinary Tract: Adrenal glands are unremarkable. Kidneys are normal, without renal calculi, solid lesion, or hydronephrosis. Bladder is unremarkable for the degree of distention. Stomach/Bowel: Stomach is within normal limits. Appendix appears normal. No evidence of bowel wall thickening, distention, or inflammatory changes. Extensive left-sided colonic diverticulosis. Vascular/Lymphatic: Aortic atherosclerosis. No enlarged abdominal or pelvic lymph nodes. Reproductive: Status post hysterectomy. Unchanged 3.0 cm simple appearing cyst in the right ovary. No follow-up imaging recommended. The left ovary is normal. Other: New trace free fluid in the pelvis.  No pneumoperitoneum. Musculoskeletal: No acute or significant osseous findings. Prior lumbosacral fusion. IMPRESSION: 1. No acute intra-abdominal process. 2. New trace  free fluid in the pelvis, nonspecific. 3. Aortic Atherosclerosis (ICD10-I70.0). Electronically Signed   By: Titus Dubin M.D.   On: 06/26/2021 17:06  ? ?DG Chest 2 View ? ?Result Date: 06/26/2021 ?CLINICAL DATA:  Chest pain EXAM: CHEST - 2 VIEW COMPARISON:  Chest x-ray 06/17/2021 FINDINGS: Cardiomegaly and mediastinum are unchanged. Calcified plaques in the aortic arch. Left-sided atrial loop recorder. Pulmonary vasculature within normal limits. Mild chronic changes at the lung bases. No focal consolidation visualized. No pleural effusion or pneumothorax. IMPRESSION: Chronic changes with no acute process identified. Electronically Signed   By: Ofilia Neas M.D.   On: 06/26/2021 14:55  ? ?CT Chest Wo Contrast ? ?Result Date: 06/26/2021 ?CLINICAL DATA:  Foreign body suspected, chest, neg xray EXAM: CT CHEST WITHOUT CONTRAST TECHNIQUE: Multidetector CT imaging of the chest was performed following the standard protocol without IV contrast. RADIATION DOSE REDUCTION: This exam was performed according to the departmental dose-optimization  program which includes automated exposure control, adjustment of the mA and/or kV according to patient size and/or use of iterative reconstruction technique. COMPARISON:  CT 11/05/2020 FINDINGS: Cardiovascular: Cardiomegaly with cardiac device within the right ventricle. Coronary artery calcifications. Normal size main and branch pulmonary arteries. Moderate atherosclerosis of the thoracic aorta. Mediastinum/Nodes: No lymphadenopathy. There are multiple hypodense thyroid nodules, largest measures up to 2.0 cm (series 2, image 20). No esophageal abnormality on noncontrast CT. Lungs/Pleura: There are new patchy opacities in the right middle lobe and right lower lobe. Additional patchy opacities in the left lower lobe and medial lingula to a lesser degree. Small right and trace left pleural effusions. Upper Abdomen: Please see separately dictated CT of the abdomen and pelvis for  findings below the diaphragm. Musculoskeletal: No acute osseous abnormality. No suspicious lytic or blastic lesions. IMPRESSION: New patchy multifocal airspace opacities in the mid to lower lungs most consistent with a

## 2021-06-26 NOTE — ED Notes (Signed)
[facility] to transport pt to [facility] ?

## 2021-06-26 NOTE — ED Triage Notes (Signed)
First Nurse Note:  ?Pt via EMS from [facility]. Pt c/o difficulty swallowing and epigastric pain. States she felt like she could not burp.  ?Pt is A&Ox4 and NAD ? ?60 HR  ?99% on RA ?12 lead EKG unremarkable  ?170s systolic BP ?

## 2021-06-26 NOTE — ED Triage Notes (Signed)
See first nurse note. Pt states she feels like she needs to belch but is unable to and this makes the pain worse. Pt also states that the pain is made worse when she eats because it feels like it gets stuck. No vomiting.  ?

## 2021-06-28 ENCOUNTER — Other Ambulatory Visit: Payer: Self-pay

## 2021-06-29 ENCOUNTER — Ambulatory Visit (INDEPENDENT_AMBULATORY_CARE_PROVIDER_SITE_OTHER): Payer: Medicare Other | Admitting: Gastroenterology

## 2021-06-29 ENCOUNTER — Encounter: Payer: Self-pay | Admitting: Gastroenterology

## 2021-06-29 ENCOUNTER — Other Ambulatory Visit: Payer: Self-pay

## 2021-06-29 VITALS — BP 156/81 | HR 60 | Temp 97.7°F | Ht 67.0 in | Wt 210.0 lb

## 2021-06-29 DIAGNOSIS — R1319 Other dysphagia: Secondary | ICD-10-CM

## 2021-06-29 DIAGNOSIS — R1310 Dysphagia, unspecified: Secondary | ICD-10-CM

## 2021-06-29 NOTE — Progress Notes (Signed)
?  ?Arlyss Repressohini R Amariah Kierstead, MD ?[address]  ?[address]  ?[place], [place] [address]  ?Main: [phone]  ?Fax: [phone] ? ? ? ?Gastroenterology Consultation ? ?Referring Provider:     Wardell Honourastillo, Faith L, FNP ?Primary Care Physician:  Katrina Honourastillo, Faith L, FNP ?Primary Gastroenterologist:  Dr. Arlyss Repressohini R Oneisha Ammons ?Reason for Consultation: Difficulty swallowing ?      ? HPI:   ?Katrina Chen is a 86 y.o. adult referred by Dr. Yancey Flemingsastillo, Katrina ParkinsFaith L, FNP  for consultation & management of difficulty swallowing.  Patient reports that for last few weeks, she has been experiencing difficulty swallowing, she reports that food is getting stuck in her chest such as bread.  Sometimes even liquids.  She does not have any teeth or dentures.  She reports that she eats soft food only and she gums the food.  She is from [facility] accompanied by a transportation person.  Her daughter could not accompany her for her visit today.  She has mild microcytic anemia, most recent hemoglobin 10.1 on 06/26/2021.  She denies any rectal bleeding, abdominal pain, change in bowel habits ? ?NSAIDs: None ? ?Antiplts/Anticoagulants/Anti thrombotics: Eliquis for history of A-fib ? ?GI Procedures: None ? ?Past Medical History:  ?Diagnosis Date  ? A-fib (HCC)   ? Hyperlipidemia   ? Hypertension   ? Iron deficiency   ? Vascular dementia (HCC)   ? ? ?Past Surgical History:  ?Procedure Laterality Date  ? PACEMAKER IMPLANT    ? ? ?Current Outpatient Medications:  ?  bisacodyl (DULCOLAX) 10 MG suppository, Place 1 suppository (10 mg total) rectally daily as needed for mild constipation., Disp: 12 suppository, Rfl: 0 ?  carvedilol (COREG) 25 MG tablet, Take 25 mg by mouth 2 (two) times daily., Disp: , Rfl:  ?  celecoxib (CELEBREX) 100 MG capsule, Take 1 capsule (100 mg total) by mouth 2 (two) times daily as needed., Disp: , Rfl:  ?  cloNIDine (CATAPRES - DOSED IN MG/24 HR) 0.2 mg/24hr patch, Place 0.2 mg onto the skin once a week., Disp: , Rfl:  ?  cycloSPORINE  (RESTASIS) 0.05 % ophthalmic emulsion, Place 1 drop into both eyes 2 (two) times daily., Disp: , Rfl:  ?  divalproex (DEPAKOTE SPRINKLE) 125 MG capsule, Take 125 mg by mouth 2 (two) times daily., Disp: , Rfl:  ?  ELIQUIS 2.5 MG TABS tablet, Take 2.5 mg by mouth 2 (two) times daily., Disp: , Rfl:  ?  FLONASE ALLERGY RELIEF 50 MCG/ACT nasal spray, Place 1 spray into both nostrils daily., Disp: , Rfl:  ?  gabapentin (NEURONTIN) 100 MG capsule, Take 100 mg by mouth every 8 (eight) hours., Disp: , Rfl:  ?  hydrALAZINE (APRESOLINE) 25 MG tablet, Take 25 mg by mouth 3 (three) times daily., Disp: , Rfl:  ?  levocetirizine (XYZAL) 5 MG tablet, Take 5 mg by mouth daily., Disp: , Rfl:  ?  lidocaine (LIDODERM) 5 %, Place 1 patch onto the skin daily. Remove & Discard patch within 12 hours or as directed by MD, Disp: 30 patch, Rfl: 0 ?  losartan (COZAAR) 100 MG tablet, Take 100 mg by mouth daily., Disp: , Rfl:  ?  melatonin 5 MG TABS, Take 10 mg by mouth at bedtime., Disp: , Rfl:  ?  methocarbamol (ROBAXIN) 500 MG tablet, Take 1 tablet (500 mg total) by mouth every 8 (eight) hours as needed for muscle spasms., Disp: , Rfl:  ?  OLANZapine (ZYPREXA) 2.5 MG tablet, Take 2.5 mg by mouth  at bedtime., Disp: , Rfl:  ?  olopatadine (PATANOL) 0.1 % ophthalmic solution, Place 1 drop into both eyes 2 (two) times daily., Disp: , Rfl:  ?  oxybutynin (DITROPAN XL) 5 MG 24 hr tablet, Take 1 tablet (5 mg total) by mouth 2 (two) times daily as needed (bladder spasm)., Disp: , Rfl:  ?  polyethylene glycol (MIRALAX / GLYCOLAX) 17 g packet, Take 17 g by mouth 2 (two) times daily., Disp: 14 each, Rfl: 0 ?  prazosin (MINIPRESS) 1 MG capsule, Take 1 mg by mouth at bedtime., Disp: , Rfl:  ?  senna-docusate (SENOKOT-S) 8.6-50 MG tablet, Take 1 tablet by mouth 2 (two) times daily., Disp: , Rfl:  ?  tamsulosin (FLOMAX) 0.4 MG CAPS capsule, Take 1 capsule (0.4 mg total) by mouth daily., Disp: 30 capsule, Rfl:  ?  venlafaxine XR (EFFEXOR-XR) 75 MG 24 hr  capsule, Take 75 mg by mouth daily., Disp: , Rfl:  ? ? ? ?No family history on file.  ? ?Social History  ? ?Tobacco Use  ? Smoking status: Never  ? Smokeless tobacco: Never  ?Vaping Use  ? Vaping Use: Never used  ?Substance Use Topics  ? Alcohol use: Never  ? Drug use: Never  ? ? ?Allergies as of 06/29/2021 - Review Complete 06/29/2021  ?Allergen Reaction Noted  ? Iodine Swelling 11/05/2020  ? Amlodipine Other (See Comments) 11/06/2020  ? Nifedipine Swelling and Other (See Comments) 11/06/2020  ? Iodinated contrast media  12/05/2020  ? Lisinopril Swelling 11/06/2020  ? Quinapril Swelling 05/27/2013  ? Shellfish allergy  11/05/2020  ? Simvastatin Swelling 05/27/2013  ? ? ?Review of Systems:    ?All systems reviewed and negative except where noted in HPI. ? ? Physical Exam:  ?BP (!) 156/81 (BP Location: Left Arm, Patient Position: Sitting, Cuff Size: Large)   Pulse 60   Temp 97.7 ?F (36.5 ?C) (Oral)   Ht 5\' 7"  (1.702 m)   Wt 210 lb (95.3 kg)   BMI 32.89 kg/m?  ?No LMP recorded. Patient has had a hysterectomy. ? ?General:   Alert,  Well-developed, well-nourished, pleasant and cooperative in NAD ?Head:  Normocephalic and atraumatic. ?Eyes:  Sclera clear, no icterus.   Conjunctiva pink. ?Ears:  Normal auditory acuity. ?Nose:  No deformity, discharge, or lesions. ?Mouth:  No deformity or lesions,oropharynx pink & moist, no teeth. ?Neck:  Supple; no masses or thyromegaly. ?Lungs:  Respirations even and unlabored.  Clear throughout to auscultation.   No wheezes, crackles, or rhonchi. No acute distress. ?Heart:  Regular rate and rhythm; no murmurs, clicks, rubs, or gallops. ?Abdomen:  Normal bowel sounds. Soft, non-tender and non-distended without masses, hepatosplenomegaly or hernias noted.  No guarding or rebound tenderness.   ?Rectal: Not performed ?Msk:  Symmetrical without gross deformities.  In wheelchair ?Pulses:  Normal pulses noted. ?Extremities:  No clubbing or edema.  No cyanosis. ?Neurologic:  Alert and  oriented x3;  grossly normal neurologically. ?Skin:  Intact without significant lesions or rashes. No jaundice. ?Psych:  Alert and cooperative. Normal mood and affect. ? ?Imaging Studies: ?No abdominal imaging ? ?Assessment and Plan:  ? ?Katrina Chen is a 86 y.o. adult with history of A-fib on Eliquis, permanent pacemaker, hypertension, hyperlipidemia is seen in consultation for difficulty swallowing, has microcytic anemia ?Proceed with upper endoscopy after obtaining cardiac clearance and clearance for interruption of anticoagulation for the procedure ?Patient is currently not on any PPI ? ?I have discussed alternative options, risks & benefits,  which include, but are  not limited to, bleeding, infection, perforation,respiratory complication & drug reaction.  The patient agrees with this plan & written consent will be obtained.   ? ? ? ?Follow up based on the EGD findings ? ? ?Arlyss Repress, MD ? ?

## 2021-06-29 NOTE — Patient Instructions (Signed)
EGD is schedule for 07/14/21 at [facility]  ?

## 2021-07-01 ENCOUNTER — Emergency Department: Payer: Medicare Other | Admitting: Certified Registered Nurse Anesthetist

## 2021-07-01 ENCOUNTER — Telehealth: Payer: Self-pay

## 2021-07-01 ENCOUNTER — Other Ambulatory Visit: Payer: Self-pay

## 2021-07-01 ENCOUNTER — Emergency Department: Payer: Medicare Other

## 2021-07-01 ENCOUNTER — Encounter
Admission: EM | Disposition: A | Discharge status: Home / Self Care | Payer: Self-pay | Source: Home / Self Care | Attending: Emergency Medicine

## 2021-07-01 ENCOUNTER — Ambulatory Visit
Admission: EM | Admit: 2021-07-01 | Discharge: 2021-07-01 | Disposition: A | Discharge status: Home / Self Care | Payer: Medicare Other | Attending: Emergency Medicine | Admitting: Emergency Medicine

## 2021-07-01 DIAGNOSIS — I4891 Unspecified atrial fibrillation: Secondary | ICD-10-CM | POA: Diagnosis not present

## 2021-07-01 DIAGNOSIS — F319 Bipolar disorder, unspecified: Secondary | ICD-10-CM | POA: Insufficient documentation

## 2021-07-01 DIAGNOSIS — I10 Essential (primary) hypertension: Secondary | ICD-10-CM | POA: Insufficient documentation

## 2021-07-01 DIAGNOSIS — K317 Polyp of stomach and duodenum: Secondary | ICD-10-CM | POA: Insufficient documentation

## 2021-07-01 DIAGNOSIS — B3781 Candidal esophagitis: Secondary | ICD-10-CM | POA: Diagnosis not present

## 2021-07-01 DIAGNOSIS — R1310 Dysphagia, unspecified: Secondary | ICD-10-CM | POA: Insufficient documentation

## 2021-07-01 DIAGNOSIS — Z950 Presence of cardiac pacemaker: Secondary | ICD-10-CM | POA: Diagnosis not present

## 2021-07-01 DIAGNOSIS — Z79899 Other long term (current) drug therapy: Secondary | ICD-10-CM | POA: Insufficient documentation

## 2021-07-01 DIAGNOSIS — F0390 Unspecified dementia without behavioral disturbance: Secondary | ICD-10-CM | POA: Insufficient documentation

## 2021-07-01 DIAGNOSIS — R1013 Epigastric pain: Secondary | ICD-10-CM

## 2021-07-01 DIAGNOSIS — R1012 Left upper quadrant pain: Secondary | ICD-10-CM | POA: Insufficient documentation

## 2021-07-01 DIAGNOSIS — Z955 Presence of coronary angioplasty implant and graft: Secondary | ICD-10-CM | POA: Insufficient documentation

## 2021-07-01 DIAGNOSIS — Z7901 Long term (current) use of anticoagulants: Secondary | ICD-10-CM | POA: Insufficient documentation

## 2021-07-01 HISTORY — PX: ESOPHAGOGASTRODUODENOSCOPY (EGD) WITH PROPOFOL: SHX5813

## 2021-07-01 LAB — CBC WITH DIFFERENTIAL/PLATELET
Abs Immature Granulocytes: 0.03 10*3/uL (ref 0.00–0.07)
Basophils Absolute: 0 10*3/uL (ref 0.0–0.1)
Basophils Relative: 0 %
Eosinophils Absolute: 0.1 10*3/uL (ref 0.0–0.5)
Eosinophils Relative: 3 %
HCT: 36.4 % (ref 36.0–46.0)
Hemoglobin: 10.6 g/dL — ABNORMAL LOW (ref 12.0–15.0)
Immature Granulocytes: 1 %
Lymphocytes Relative: 25 %
Lymphs Abs: 1.2 10*3/uL (ref 0.7–4.0)
MCH: 22.7 pg — ABNORMAL LOW (ref 26.0–34.0)
MCHC: 29.1 g/dL — ABNORMAL LOW (ref 30.0–36.0)
MCV: 78.1 fL — ABNORMAL LOW (ref 80.0–100.0)
Monocytes Absolute: 0.5 10*3/uL (ref 0.1–1.0)
Monocytes Relative: 9 %
Neutro Abs: 3 10*3/uL (ref 1.7–7.7)
Neutrophils Relative %: 62 %
Platelets: 277 10*3/uL (ref 150–400)
RBC: 4.66 MIL/uL (ref 3.87–5.11)
RDW: 16 % — ABNORMAL HIGH (ref 11.5–15.5)
WBC: 4.9 10*3/uL (ref 4.0–10.5)
nRBC: 0 % (ref 0.0–0.2)

## 2021-07-01 LAB — URINALYSIS, ROUTINE W REFLEX MICROSCOPIC
Bilirubin Urine: NEGATIVE
Glucose, UA: NEGATIVE mg/dL
Hgb urine dipstick: NEGATIVE
Ketones, ur: NEGATIVE mg/dL
Leukocytes,Ua: NEGATIVE
Nitrite: NEGATIVE
Protein, ur: NEGATIVE mg/dL
Specific Gravity, Urine: 1.002 — ABNORMAL LOW (ref 1.005–1.030)
pH: 7 (ref 5.0–8.0)

## 2021-07-01 LAB — COMPREHENSIVE METABOLIC PANEL
ALT: 9 U/L (ref 0–44)
AST: 17 U/L (ref 15–41)
Albumin: 3.3 g/dL — ABNORMAL LOW (ref 3.5–5.0)
Alkaline Phosphatase: 85 U/L (ref 38–126)
Anion gap: 9 (ref 5–15)
BUN: 17 mg/dL (ref 8–23)
CO2: 28 mmol/L (ref 22–32)
Calcium: 9.8 mg/dL (ref 8.9–10.3)
Chloride: 97 mmol/L — ABNORMAL LOW (ref 98–111)
Creatinine, Ser: 1.19 mg/dL — ABNORMAL HIGH (ref 0.44–1.00)
GFR, Estimated: 45 mL/min — ABNORMAL LOW (ref >60)
Glucose, Bld: 91 mg/dL (ref 70–99)
Potassium: 5.1 mmol/L (ref 3.5–5.1)
Sodium: 134 mmol/L — ABNORMAL LOW (ref 135–145)
Total Bilirubin: 0.5 mg/dL (ref 0.3–1.2)
Total Protein: 7.3 g/dL (ref 6.5–8.1)

## 2021-07-01 LAB — KOH PREP: Special Requests: NORMAL

## 2021-07-01 SURGERY — ESOPHAGOGASTRODUODENOSCOPY (EGD) WITH PROPOFOL
Anesthesia: General

## 2021-07-01 MED ORDER — PROPOFOL 10 MG/ML IV BOLUS
INTRAVENOUS | Status: DC | PRN
  Administered 2021-07-01: 100 mg via INTRAVENOUS

## 2021-07-01 MED ORDER — LIDOCAINE HCL (CARDIAC) PF 100 MG/5ML IV SOSY
PREFILLED_SYRINGE | INTRAVENOUS | Status: DC | PRN
  Administered 2021-07-01: 60 mg via INTRAVENOUS

## 2021-07-01 MED ORDER — MORPHINE SULFATE (PF) 2 MG/ML IV SOLN
2.0000 mg | Freq: Once | INTRAVENOUS | Status: AC
  Administered 2021-07-01: 2 mg via INTRAVENOUS
  Filled 2021-07-01: qty 1

## 2021-07-01 MED ORDER — DEXAMETHASONE SODIUM PHOSPHATE 10 MG/ML IJ SOLN
INTRAMUSCULAR | Status: DC | PRN
  Administered 2021-07-01: 10 mg via INTRAVENOUS

## 2021-07-01 MED ORDER — ONDANSETRON HCL 4 MG/2ML IJ SOLN
4.0000 mg | Freq: Once | INTRAMUSCULAR | Status: AC
  Administered 2021-07-01: 4 mg via INTRAVENOUS
  Filled 2021-07-01: qty 2

## 2021-07-01 MED ORDER — SODIUM CHLORIDE 0.9 % IV SOLN: INTRAVENOUS | Status: DC

## 2021-07-01 MED ORDER — FENTANYL CITRATE (PF) 100 MCG/2ML IJ SOLN
INTRAMUSCULAR | Status: DC | PRN
  Administered 2021-07-01: 25 ug via INTRAVENOUS

## 2021-07-01 MED ORDER — ONDANSETRON HCL 4 MG/2ML IJ SOLN
INTRAMUSCULAR | Status: DC | PRN
  Administered 2021-07-01: 4 mg via INTRAVENOUS

## 2021-07-01 MED ORDER — FENTANYL CITRATE (PF) 100 MCG/2ML IJ SOLN
INTRAMUSCULAR | Status: AC
Start: 2021-07-01 — End: ?
  Filled 2021-07-01: qty 2

## 2021-07-01 MED ORDER — SUCCINYLCHOLINE CHLORIDE 200 MG/10ML IV SOSY
PREFILLED_SYRINGE | INTRAVENOUS | Status: DC | PRN
  Administered 2021-07-01: 120 mg via INTRAVENOUS

## 2021-07-01 NOTE — ED Triage Notes (Addendum)
Patient to ER via [facility] from [facility] with complaints of difficulty swallowing. Reports history of dysphagia, has a GI appointment on may 3rd. Recently evaluated for the same.  ? ?Patient reports last night her dysphagia seemed to get worse. States she feels like she has food stuck in her esophagus, points to her epigastric area. Patient was able to swallow some liquid this morning but did not eat breakfast.  ? ?EMS VS- BP 132/87, HR 72, RR 18, spo2 98% RA ?

## 2021-07-01 NOTE — Anesthesia Procedure Notes (Signed)
Procedure Name: Intubation ?Date/Time: 07/01/2021 2:06 PM ?Performed by: Ginger Carne, CRNA ?Pre-anesthesia Checklist: Patient identified, Emergency Drugs available, Suction available, Patient being monitored and Timeout performed ?Patient Re-evaluated:Patient Re-evaluated prior to induction ?Oxygen Delivery Method: Circle system utilized ?Preoxygenation: Pre-oxygenation with 100% oxygen ?Induction Type: IV induction, Rapid sequence and Cricoid Pressure applied ?Laryngoscope Size: McGraph and 3 ?Tube type: Oral ?Tube size: 7.0 mm ?Number of attempts: 1 ?Airway Equipment and Method: Stylet and Video-laryngoscopy ?Placement Confirmation: ETT inserted through vocal cords under direct vision, positive ETCO2 and breath sounds checked- equal and bilateral ?Secured at: 21 cm ?Tube secured with: Tape ?Dental Injury: Teeth and Oropharynx as per pre-operative assessment  ? ? ? ? ?

## 2021-07-01 NOTE — Anesthesia Preprocedure Evaluation (Signed)
Anesthesia Evaluation  ?Patient identified by MRN, date of birth, ID band ?Patient awake ? ? ? ?Reviewed: ?Allergy & Precautions, H&P , NPO status , Patient's Chart, lab work & pertinent test results, reviewed documented beta blocker date and time  ? ?History of Anesthesia Complications ?Negative for: history of anesthetic complications ? ?Airway ?Mallampati: III ? ?TM Distance: >3 FB ?Neck ROM: full ? ? ? Dental ? ?(+) Dental Advidsory Given, Edentulous Upper, Edentulous Lower ?  ?Pulmonary ?shortness of breath and with exertion, asthma , neg sleep apnea, neg COPD, neg recent URI,  ?  ?Pulmonary exam normal ?breath sounds clear to auscultation ? ? ? ? ? ? Cardiovascular ?Exercise Tolerance: Good ?hypertension, (-) angina+ CAD  ?(-) Past MI and (-) Cardiac Stents + dysrhythmias Atrial Fibrillation + pacemaker + Valvular Problems/Murmurs  ?Rhythm:regular Rate:Normal ? ? ?  ?Neuro/Psych ?PSYCHIATRIC DISORDERS Bipolar Disorder Dementia negative neurological ROS ?   ? GI/Hepatic ?negative GI ROS, Neg liver ROS,   ?Endo/Other  ?negative endocrine ROS ? Renal/GU ?CRFRenal disease  ?negative genitourinary ?  ?Musculoskeletal ? ? Abdominal ?  ?Peds ? Hematology ?negative hematology ROS ?(+)   ?Anesthesia Other Findings ?Past Medical History: ?No date: A-fib [facility]) ?No date: Hyperlipidemia ?No date: Hypertension ?No date: Iron deficiency ?No date: Vascular dementia (HCC) ? ? Reproductive/Obstetrics ?negative OB ROS ? ?  ? ? ? ? ? ? ? ? ? ? ? ? ? ?  ?  ? ? ? ? ? ? ? ? ?Anesthesia Physical ?Anesthesia Plan ? ?ASA: 3 and emergent ? ?Anesthesia Plan: General  ? ?Post-op Pain Management:   ? ?Induction: Intravenous, Rapid sequence and Cricoid pressure planned ? ?PONV Risk Score and Plan: 3 and Ondansetron, Dexamethasone and Treatment may vary due to age or medical condition ? ?Airway Management Planned: Oral ETT ? ?Additional Equipment:  ? ?Intra-op Plan:  ? ?Post-operative Plan: Extubation in  OR ? ?Informed Consent: I have reviewed the patients History and Physical, chart, labs and discussed the procedure including the risks, benefits and alternatives for the proposed anesthesia with the patient or authorized representative who has indicated his/her understanding and acceptance.  ? ? ? ?Dental Advisory Given ? ?Plan Discussed with: Anesthesiologist, CRNA and Surgeon ? ?Anesthesia Plan Comments:   ? ? ? ? ? ? ?Anesthesia Quick Evaluation ? ?

## 2021-07-01 NOTE — ED Provider Notes (Signed)
? ?[facility] ?Provider Note ? ? ? Event Date/Time  ? First MD Initiated Contact with Patient 07/01/21 0920   ?  (approximate) ? ? ?History  ? ?Dysphagia ? ? ?HPI ? ?Katrina Chen is a 86 y.o. adult with a past history of having difficulty swallowing solids and sometimes even liquids.  She has an appointment on the fourth with Dr. Allegra Lai to be having endoscopy.  However she reports that last night something seems to have gotten stuck in her esophagus just above the stomach.  She feels it stuck there now.  She can swallow liquids but it does not seem to go down.  She is not vomiting. ? ?  ? ? ?Physical Exam  ? ?Triage Vital Signs: ?ED Triage Vitals  ?Enc Vitals Group  ?   BP 07/01/21 0923 (!) 167/71  ?   Pulse Rate 07/01/21 0923 71  ?   Resp 07/01/21 0923 16  ?   Temp 07/01/21 0923 98.3 ?F (36.8 ?C)  ?   Temp Source 07/01/21 0923 Oral  ?   SpO2 07/01/21 0923 98 %  ?   Weight --   ?   Height 07/01/21 0920 5\' 7"  (1.702 m)  ?   Head Circumference --   ?   [facility] --   ?   Pain Score 07/01/21 0920 0  ?   Pain Loc --   ?   Pain Edu? --   ?   Excl. in GC? --   ? ? ?Most recent vital signs: ?Vitals:  ? 07/01/21 1432 07/01/21 1521  ?BP: (!) 156/61 (!) 175/63  ?Pulse: 61   ?Resp: 16   ?Temp:    ?SpO2: 100%   ? ? ? ?General: Awake, alert, looks uncomfortable ?Mouth: Looks slightly dry ?CV:  Good peripheral perfusion.  Heart regular rate and rhythm no audible murmurs ?Resp:  Normal effort.  Lungs are clear ?Abd:  No distention.  Abdomen soft bowel sounds are positive there is no tenderness ? ? ? ?ED Results / Procedures / Treatments  ? ?Labs ?(all labs ordered are listed, but only abnormal results are displayed) ?Labs Reviewed  ?COMPREHENSIVE METABOLIC PANEL - Abnormal; Notable for the following components:  ?    Result Value  ? Sodium 134 (*)   ? Chloride 97 (*)   ? Creatinine, Ser 1.19 (*)   ? Albumin 3.3 (*)   ? GFR, Estimated 45 (*)   ? All other components within normal limits  ?CBC WITH  DIFFERENTIAL/PLATELET - Abnormal; Notable for the following components:  ? Hemoglobin 10.6 (*)   ? MCV 78.1 (*)   ? MCH 22.7 (*)   ? MCHC 29.1 (*)   ? RDW 16.0 (*)   ? All other components within normal limits  ?URINALYSIS, ROUTINE W REFLEX MICROSCOPIC - Abnormal; Notable for the following components:  ? Color, Urine STRAW (*)   ? APPearance CLEAR (*)   ? Specific Gravity, Urine 1.002 (*)   ? All other components within normal limits  ?KOH PREP  ?SURGICAL PATHOLOGY  ? ? ? ?EKG ? ?EKG read and interpreted by me shows normal sinus rhythm rate of 66 left axis right bundle branch block similar to EKG from 4 6 of this year. ? ? ?RADIOLOGY ?CT read by radiology and films reviewed by me show improving or at least not worsening right-sided densities.  Also radiology thinks that there is some debris in the esophagus and about the thyroid nodule.  This  will need to be followed up.  In fact the gastroenterology doctor and I will take the patient to the endoscope after this. ? ? ?PROCEDURES: ? ?Critical Care performed:  ? ?Procedures ? ? ?MEDICATIONS ORDERED IN ED: ?Medications  ?0.9 %  sodium chloride infusion (has no administration in time range)  ?0.9 %  sodium chloride infusion ( Intravenous Anesthesia Volume Adjustment 07/01/21 1417)  ?morphine (PF) 2 MG/ML injection 2 mg (2 mg Intravenous Given 07/01/21 1117)  ?ondansetron [facility](ZOFRAN) injection 4 mg (4 mg Intravenous Given 07/01/21 1117)  ? ? ? ?IMPRESSION / MDM / ASSESSMENT AND PLAN / ED COURSE  ?I reviewed the triage vital signs and the nursing notes. ?----------------------------------------- ?12:10 PM on 07/01/2021 ?----------------------------------------- ?CT looks about the same as 1 from 2 weeks ago.  I will have Dr. Rolin BarryAnna Hoover I have already spoken with take a look at her esophagus and see if there is anything actually blocking it.  If that is negative she can follow-up outpatient I expect. ? ? ? ?The patient is on the cardiac monitor to evaluate for evidence of  arrhythmia and/or significant heart rate changes.  None were seen ?Patient will need evaluation for thyroid nodule. ? ? ?FINAL CLINICAL IMPRESSION(S) / ED DIAGNOSES  ? ?Final diagnoses:  ?Epigastric pain  ? ? ? ?Rx / DC Orders  ? ?ED Discharge Orders   ? ?      Ordered  ?  Discharge instructions       ?Comments: Wyline MoodKiran Anna MD ?[phone] ?[place], [place] [address] ?Phone: [phone] ?Fax : [phone] ? ? ?YOU HAD AN ENDOSCOPIC PROCEDURE TODAY: Refer to the procedure report that was given to you for any specific questions about what was found during the examination.  If the procedure report does not answer your questions, please call your gastroenterologist to clarify. ? ?YOU SHOULD EXPECT: Some feelings of bloating in the abdomen. Passage of more gas than usual.  Walking can help get rid of the air that was put into your GI tract during the procedure and reduce the bloating. If you had a lower endoscopy (such as a colonoscopy or flexible sigmoidoscopy) you may notice spotting of blood in your stool or on the toilet paper.  ? ?DIET: Your first meal following the procedure should be a light meal and then it is ok to progress to your normal diet.  A half-sandwich or bowl of soup is an example of a good first meal.  Heavy or fried foods are harder to digest and may make you feel nasueas or bloated.  Drink plenty of fluids but you should avoid alcoholic beverages for 24 hours. ? ?ACTIVITY: Your care partner should take you home directly after the procedure.  You should plan to take it easy, moving slowly for the rest of the day.  You can resume normal activity the day after the procedure however you should NOT DRIVE, make legal decisions or use heavy machinery for 24 hours (because of the sedation medicines used during the test).   ? ?SYMPTOMS TO REPORT IMMEDIATELY  ?A gastroenterologist can be reached at any hour.  Please call your doctor's office for any of the following symptoms: ? ?Following lower  endoscopy (colonoscopy, flexible sigmoidoscopy) ? Excessive amounts of blood in the stool ? Significant tenderness, worsening of abdominal pains ? Swelling of the abdomen that is new, acute ? Fever of 100? or higher ?Following upper endoscopy (EGD, EUS, ERCP) ? Vomiting of blood or coffee ground material ?  New, significant abdominal pain ? New, significant chest pain or pain under the shoulder blades ? Painful or persistently difficult swallowing ? New shortness of breath ? Black, tarry-looking stools ? ?FOLLOW UP: ?If any biopsies were taken you will be contacted by phone or by letter within the next 1-3 weeks.  Call your gastroenterologist if you have not heard about the biopsies in 3 weeks.  ? ?Please also call your gastroenterologist's office with any specific questions about appointments or follow up tests.  ? 07/01/21 1420  ?  Increase activity slowly       ? 07/01/21 1420  ?  Diet - low sodium heart healthy       ? 07/01/21 1420  ? ?  ?  ? ?  ? ? ? ?Note:  This document was prepared using Dragon voice recognition software and may include unintentional dictation errors. ?  ?Arnaldo Natal, MD ?07/01/21 1547 ? ?

## 2021-07-01 NOTE — Anesthesia Postprocedure Evaluation (Signed)
Anesthesia Post Note ? ?Patient: Katrina Chen ? ?Procedure(s) Performed: ESOPHAGOGASTRODUODENOSCOPY (EGD) WITH PROPOFOL ? ?Patient location during evaluation: Endoscopy ?Anesthesia Type: General ?Level of consciousness: awake and alert ?Pain management: pain level controlled ?Vital Signs Assessment: post-procedure vital signs reviewed and stable ?Respiratory status: spontaneous breathing, nonlabored ventilation and respiratory function stable ?Cardiovascular status: blood pressure returned to baseline and stable ?Postop Assessment: no apparent nausea or vomiting ?Anesthetic complications: no ? ? ?No notable events documented. ? ? ?Last Vitals:  ?Vitals:  ? 07/01/21 1432 07/01/21 1521  ?BP: (!) 156/61 (!) 175/63  ?Pulse: 61   ?Resp: 16   ?Temp:    ?SpO2: 100%   ?  ?Last Pain:  ?Vitals:  ? 07/01/21 1531  ?TempSrc:   ?PainSc: 0-No pain  ? ? ?  ?  ?  ?  ?  ?  ? ?Foye Deer ? ? ? ? ?

## 2021-07-01 NOTE — OR Nursing (Signed)
Report called to Reece Packer, LPN at [facility], ?

## 2021-07-01 NOTE — Telephone Encounter (Signed)
Patient Doctor Jeanella Craze is calling because they do not like for patient to be off the Eliquis and if they need to be off of it they usually like for the provider doing the procedure to take them off of the medication. Informed her no this has to come from the Provider office that is prescribing the medication. The nurse states then the provider needs to call this provider and call at [phone] ?

## 2021-07-01 NOTE — Progress Notes (Signed)
[facility] ED[facility] Hospital Liaison note:  This patient is currently enrolled in [facility] outpatient-based Palliative Care. Will continue to follow for disposition.  Please call with any outpatient palliative questions or concerns.  Thank you, Dee Curry, LPN [facility] Hospital Liaison [phone] 

## 2021-07-01 NOTE — Discharge Instructions (Signed)
Please follow-up with your primary care doctor to make sure that chest x-ray CT scan findings in the right lung continue to improve.  Please also have them check on your thyroid nodule that was seen on the CT scan.  Follow the instructions of Dr. And I gave you.  For any problems. ?

## 2021-07-01 NOTE — Op Note (Signed)
[facility] ?Gastroenterology ?Patient Name: Katrina Chen ?Procedure Date: 07/01/2021 1:17 PM ?MRN: [id number] ?Account #: [id number] ?Date of Birth: February 25, 1936 ?Admit Type: Outpatient ?Age: 86 ?Room: [facility] ENDO ROOM 1 ?Gender: Female ?Note Status: Finalized ?Instrument Name: Upper Endoscope [id number] ?Procedure:             Upper GI endoscopy ?Indications:           Dysphagia ?Providers:             Jonathon Bellows MD, MD ?Referring MD:          Kizzie Furnish. Aggie Moats (Referring MD) ?Medicines:             Monitored Anesthesia Care ?Complications:         No immediate complications. ?Procedure:             Pre-Anesthesia Assessment: ?                       - Prior to the procedure, a History and Physical was  ?                       performed, and patient medications, allergies and  ?                       sensitivities were reviewed. The patient's tolerance  ?                       of previous anesthesia was reviewed. ?                       - Prior to the procedure, a History and Physical was  ?                       performed, and patient medications, allergies and  ?                       sensitivities were reviewed. The patient's tolerance  ?                       of previous anesthesia was reviewed. ?                       - The risks and benefits of the procedure and the  ?                       sedation options and risks were discussed with the  ?                       patient. All questions were answered and informed  ?                       consent was obtained. ?                       - ASA Grade Assessment: III - A patient with severe  ?                       systemic disease. ?                       After obtaining informed consent,  the endoscope was  ?                       passed under direct vision. Throughout the procedure,  ?                       the patient's blood pressure, pulse, and oxygen  ?                       saturations were monitored continuously. The Endoscope  ?                        was introduced through the mouth, and advanced to the  ?                       third part of duodenum. The upper GI endoscopy was  ?                       accomplished with ease. The patient tolerated the  ?                       procedure well. ?Findings: ?     The examined duodenum was normal. ?     A single 13 mm pedunculated polyp with no bleeding and no stigmata of  ?     recent bleeding was found in the prepyloric region of the stomach.  ?     Polypectomy was not attempted due to the patient taking anticoagulation  ?     medication. ?     Diffuse, yellow plaques were found in the upper third of the esophagus.  ?     Biopsies were taken with a cold forceps for histology. Cells for  ?     cytology were obtained by brushing. ?     The cardia and gastric fundus were normal on retroflexion. ?Impression:            - Normal examined duodenum. ?                       - A single gastric polyp. Resection not attempted. ?                       - Esophageal plaques were found, suspicious for  ?                       candidiasis. Biopsied. Cells for cytology obtained. ?Recommendation:        - Discharge patient to home (with escort). ?                       - Resume previous diet. ?                       - Continue present medications. ?                       - Await pathology results. ?                       - Return to GI office as previously scheduled. ?                       -  Polypectomy to be performed on another occasion off  ?                       eloquis for 2 days ?Procedure Code(s):     --- Professional --- ?                       [phone], Esophagogastroduodenoscopy, flexible,  ?                       transoral; with biopsy, single or multiple ?Diagnosis Code(s):     --- Professional --- ?                       K31.7, Polyp of stomach and duodenum ?                       K22.9, Disease of esophagus, unspecified ?                       R13.10, Dysphagia, unspecified ?CPT copyright 2019 American Medical  Association. All rights reserved. ?The codes documented in this report are preliminary and upon coder review may  ?be revised to meet current compliance requirements. ?Jonathon Bellows, MD ?Jonathon Bellows MD, MD ?07/01/2021 2:19:03 PM ?This report has been signed electronically. ?Number of Addenda: 0 ?Note Initiated On: 07/01/2021 1:17 PM ?Estimated Blood Loss:  Estimated blood loss: none. ?     [facility] ?

## 2021-07-01 NOTE — ED Notes (Signed)
Pt to endo with endo RN ?

## 2021-07-01 NOTE — Transfer of Care (Signed)
Immediate Anesthesia Transfer of Care Note ? ?Patient: Katrina Chen ? ?Procedure(s) Performed: ESOPHAGOGASTRODUODENOSCOPY (EGD) WITH PROPOFOL ? ?Patient Location: PACU ? ?Anesthesia Type:General ? ?Level of Consciousness: drowsy ? ?Airway & Oxygen Therapy: Patient Spontanous Breathing and Patient connected to nasal cannula oxygen ? ?Post-op Assessment: Report given to RN and Post -op Vital signs reviewed and stable ? ?Post vital signs: Reviewed and stable ? ?Last Vitals:  ?Vitals Value Taken Time  ?BP 156/61 07/01/21 1432  ?Temp    ?Pulse 61 07/01/21 1432  ?Resp 16 07/01/21 1432  ?SpO2 100 % 07/01/21 1432  ? ? ?Last Pain:  ?Vitals:  ? 07/01/21 1337  ?TempSrc: Temporal  ?PainSc: 0-No pain  ?   ? ?  ? ?Complications: No notable events documented. ?

## 2021-07-01 NOTE — Consult Note (Signed)
?  ?Wyline MoodKiran Stacie Knutzen , MD ?[address], [address], [place], [place], [address] ?[address], [address], [place], [place], [address] ?Phone: [phone]  ?Fax: [phone] ? Consultation ? ?Referring Provider:     Dr Darnelle CatalanMalinda ?Primary Care Physician:  Wardell Honourastillo, Faith L, FNP ?Primary Gastroenterologist:  Dr. Allegra LaiVanga         ?Reason for Consultation:     Dysphagia- food bolus ? ?Date of Admission:  07/01/2021 ?Date of Consultation:  07/01/2021 ?       ? HPI:   ?Katrina FrameJean Mikrut is a 86 y.o. adult The patient saw Dr. Allegra LaiVanga 2 days back for dysphagia scheduled to have an outpatient upper endoscopy but came into the emergency room with inability to swallow after having some food stuck in esophagus last night.  Inability to keep liquids down. ?CT scan of the chest was performed after an x-ray showing mild right basilar atelectasis.  Dr. Juliette AlcideMelinda discussed the CT with radiology but shows no acute abnormalities.  There is debris in the lower esophagus recommend direct visualization. ? ?Per patient issue difficulty swallowing ongoing for a few weeks but worse since last night.  No pain presently.  Appears comfortably laying in the bed.  She is on Eliquis last dose taken today. ? ? ?Past Medical History:  ?Diagnosis Date  ? A-fib (HCC)   ? Hyperlipidemia   ? Hypertension   ? Iron deficiency   ? Vascular dementia (HCC)   ? ? ?Past Surgical History:  ?Procedure Laterality Date  ? PACEMAKER IMPLANT    ? ? ?Prior to Admission medications   ?Medication Sig Start Date End Date Taking? Authorizing Provider  ?bisacodyl (DULCOLAX) 10 MG suppository Place 1 suppository (10 mg total) rectally daily as needed for mild constipation. 11/08/20   Tresa MooreSreenath, Sudheer B, MD  ?carvedilol (COREG) 25 MG tablet Take 25 mg by mouth 2 (two) times daily. 05/24/21   [provider]  ?celecoxib (CELEBREX) 100 MG capsule Take 1 capsule (100 mg total) by mouth 2 (two) times daily as needed. 11/16/20   Burnadette PopAdhikari, Amrit, MD  ?cloNIDine (CATAPRES - DOSED IN MG/24 HR) 0.2  mg/24hr patch Place 0.2 mg onto the skin once a week. 06/02/21   [provider]  ?cycloSPORINE (RESTASIS) 0.05 % ophthalmic emulsion Place 1 drop into both eyes 2 (two) times daily. 05/26/21   [provider]  ?divalproex (DEPAKOTE SPRINKLE) 125 MG capsule Take 125 mg by mouth 2 (two) times daily. 02/18/21   [provider]  ?ELIQUIS 2.5 MG TABS tablet Take 2.5 mg by mouth 2 (two) times daily. 11/03/20   [provider]  ?Aleda GranaFLONASE ALLERGY RELIEF 50 MCG/ACT nasal spray Place 1 spray into both nostrils daily. 05/31/21   [provider]  ?gabapentin (NEURONTIN) 100 MG capsule Take 100 mg by mouth every 8 (eight) hours. 11/03/20   [provider]  ?hydrALAZINE (APRESOLINE) 25 MG tablet Take 25 mg by mouth 3 (three) times daily. 06/24/21   [provider]  ?levocetirizine (XYZAL) 5 MG tablet Take 5 mg by mouth daily. 06/11/21   [provider]  ?lidocaine (LIDODERM) 5 % Place 1 patch onto the skin daily. Remove & Discard patch within 12 hours or as directed by MD 06/20/21   Tresa MooreSreenath, Sudheer B, MD  ?losartan (COZAAR) 100 MG tablet Take 100 mg by mouth daily. 10/18/20   [provider]  ?melatonin 5 MG TABS Take 10 mg by mouth at bedtime.    [provider]  ?methocarbamol (ROBAXIN) 500  MG tablet Take 1 tablet (500 mg total) by mouth every 8 (eight) hours as needed for muscle spasms. 06/20/21   Tresa Moore, MD  ?OLANZapine (ZYPREXA) 2.5 MG tablet Take 2.5 mg by mouth at bedtime. 10/28/20   [provider]  ?olopatadine (PATANOL) 0.1 % ophthalmic solution Place 1 drop into both eyes 2 (two) times daily.    [provider]  ?oxybutynin (DITROPAN XL) 5 MG 24 hr tablet Take 1 tablet (5 mg total) by mouth 2 (two) times daily as needed (bladder spasm). 06/20/21   Tresa Moore, MD  ?polyethylene glycol (MIRALAX / GLYCOLAX) 17 g packet Take 17 g by mouth 2 (two) times daily. 11/16/20   Burnadette Pop, MD  ?prazosin  (MINIPRESS) 1 MG capsule Take 1 mg by mouth at bedtime. 06/03/21   [provider]  ?senna-docusate (SENOKOT-S) 8.6-50 MG tablet Take 1 tablet by mouth 2 (two) times daily. 11/08/20   Tresa Moore, MD  ?tamsulosin (FLOMAX) 0.4 MG CAPS capsule Take 1 capsule (0.4 mg total) by mouth daily. 11/16/20   Burnadette Pop, MD  ?venlafaxine XR (EFFEXOR-XR) 75 MG 24 hr capsule Take 75 mg by mouth daily. 06/20/21   [provider]  ? ? ?No family history on file.  ? ?Social History  ? ?Tobacco Use  ? Smoking status: Never  ? Smokeless tobacco: Never  ?Vaping Use  ? Vaping Use: Never used  ?Substance Use Topics  ? Alcohol use: Never  ? Drug use: Never  ? ? ?Allergies as of 07/01/2021 - Review Complete 07/01/2021  ?Allergen Reaction Noted  ? Iodine Swelling 11/05/2020  ? Amlodipine Other (See Comments) 11/06/2020  ? Nifedipine Swelling and Other (See Comments) 11/06/2020  ? Iodinated contrast media  12/05/2020  ? Lisinopril Swelling 11/06/2020  ? Quinapril Swelling 05/27/2013  ? Shellfish allergy  11/05/2020  ? Simvastatin Swelling 05/27/2013  ? ? ?Review of Systems:    ?All systems reviewed and negative except where noted in HPI. ? ? Physical Exam:  ?Vital signs in last 24 hours: ?Temp:  [98.3 ?F (36.8 ?C)] 98.3 ?F (36.8 ?C) (04/21 7673) ?Pulse Rate:  [59-71] 60 (04/21 1100) ?Resp:  [14-19] 19 (04/21 1100) ?BP: (152-167)/(65-71) 167/68 (04/21 1100) ?SpO2:  [98 %-99 %] 99 % (04/21 1100) ?  ?General:   Pleasant, cooperative in NAD ?Head:  Normocephalic and atraumatic. ?Eyes:   No icterus.   Conjunctiva pink. PERRLA. ?Ears:  Normal auditory acuity. ?Neck:  Supple; no masses or thyroidomegaly ?Lungs: Respirations even and unlabored. Lungs clear to auscultation bilaterally.   No wheezes, crackles, or rhonchi.  ?Heart:  Regular rate and rhythm;  Without murmur, clicks, rubs or gallops ?Abdomen:  Soft, nondistended, nontender. Normal bowel sounds. No appreciable masses or hepatomegaly.  No rebound or guarding.   ?Neurologic:  Alert and oriented x3;  grossly normal neurologically. ?Psych:  Alert and cooperative. Normal affect. ? ?LAB RESULTS: ?Recent Labs  ?  07/01/21 ?4193  ?WBC 4.9  ?HGB 10.6*  ?HCT 36.4  ?PLT 277  ? ?BMET ?Recent Labs  ?  07/01/21 ?7902  ?NA 134*  ?K 5.1  ?CL 97*  ?CO2 28  ?GLUCOSE 91  ?BUN 17  ?CREATININE 1.19*  ?CALCIUM 9.8  ? ?LFT ?Recent Labs  ?  07/01/21 ?4097  ?PROT 7.3  ?ALBUMIN 3.3*  ?AST 17  ?ALT 9  ?ALKPHOS 85  ?BILITOT 0.5  ? ?PT/INR ?No results for input(s): LABPROT, INR in the last 72 hours. ? ?STUDIES: ?DG Chest 2 View ? ?  Result Date: 07/01/2021 ?CLINICAL DATA:  Dysphagia. EXAM: CHEST - 2 VIEW COMPARISON:  June 26, 2021. FINDINGS: Stable cardiomediastinal silhouette. Left lung is clear. Mild right basilar atelectasis or infiltrate is noted with small right pleural effusion. Bony thorax is unremarkable. IMPRESSION: Mild right basilar atelectasis or infiltrate is noted with small right pleural effusion. Aortic Atherosclerosis (ICD10-I70.0). Electronically Signed   By: Lupita Raider M.D.   On: 07/01/2021 09:50  ? ?CT Chest Wo Contrast ? ?Result Date: 07/01/2021 ?CLINICAL DATA:  Pleural effusion, malignancy suspected EXAM: CT CHEST WITHOUT CONTRAST TECHNIQUE: Multidetector CT imaging of the chest was performed following the standard protocol without IV contrast. RADIATION DOSE REDUCTION: This exam was performed according to the departmental dose-optimization program which includes automated exposure control, adjustment of the mA and/or kV according to patient size and/or use of iterative reconstruction technique. COMPARISON:  Chest CT June 26, 2021. FINDINGS: Cardiovascular: Coronary artery and aortic calcific atherosclerosis. Normal heart size. No pericardial effusion. Mediastinum/Nodes: No enlarged mediastinal or axillary lymph nodes. Filling defect within the lower esophagus. Redemonstrated 2 cm thyroid nodule. Lungs/Pleura: Improved patchy opacities in the right middle lobe, right lower  lobe, and left lower lobe. Similar small right and trace left pleural effusions. No pneumothorax. Upper Abdomen: No acute abnormality. Musculoskeletal: Multilevel degenerative change in the spine. No evidence of a

## 2021-07-02 NOTE — Telephone Encounter (Signed)
She underwent EGD yesterday.  Please cancel her outpatient EGD ? ?RV ?

## 2021-07-04 ENCOUNTER — Encounter: Payer: Self-pay | Admitting: Gastroenterology

## 2021-07-04 NOTE — Telephone Encounter (Signed)
Called trish and canceled the EGD for outpatient. Called [facility] and informed them also  ?

## 2021-07-05 LAB — SURGICAL PATHOLOGY

## 2021-07-11 ENCOUNTER — Other Ambulatory Visit: Payer: Self-pay | Admitting: Otolaryngology

## 2021-07-11 DIAGNOSIS — R1310 Dysphagia, unspecified: Secondary | ICD-10-CM

## 2021-07-11 DIAGNOSIS — R633 Feeding difficulties, unspecified: Secondary | ICD-10-CM

## 2021-07-12 ENCOUNTER — Telehealth: Payer: Self-pay | Admitting: Gastroenterology

## 2021-07-12 DIAGNOSIS — D509 Iron deficiency anemia, unspecified: Secondary | ICD-10-CM

## 2021-07-12 DIAGNOSIS — D519 Vitamin B12 deficiency anemia, unspecified: Secondary | ICD-10-CM

## 2021-07-12 NOTE — Progress Notes (Signed)
Inform needs diflucan for candida - 100 mg once daily for 14 days .

## 2021-07-12 NOTE — Telephone Encounter (Signed)
Reece Packer from [facility] is requesting a call back from Dr Allegra Lai or her nurse in reference to patients recent ER visit, results of colonoscopy and upcoming plan.  ? ?Reece Packer is the nursing supervisor and her extension is [phone].  ?

## 2021-07-12 NOTE — Telephone Encounter (Signed)
Tried to call Baxter Hire back and she is gone for today and she will be back tomorrow. Will call back tomorrow  ?

## 2021-07-13 NOTE — Telephone Encounter (Signed)
Katrina Chen ? ?Please schedule EGD with me for removal of gastric polyp ?She will need to be off Eliquis for removing gastric polyp after obtaining clearance for interruption of Eliquis ? ?Also, I recommend iron panel, B12 and folate levels ?For history of microcytic anemia and B12 deficiency anemia ? ?Thanks ?RV ?

## 2021-07-13 NOTE — Telephone Encounter (Signed)
Called and left a message for call back for Kristen  ?

## 2021-07-13 NOTE — Telephone Encounter (Signed)
Cyril Mourning states the doctor with [facility] is calling about patient EGD she had done in the hospital. She states on report it said they found a Gastric polyp but they were not able to remove it due to her being on the blood thinner. They want to know if she needs to have another EGD off the blood thinner so the Gastric polyp can be removed  ?

## 2021-07-14 ENCOUNTER — Telehealth: Payer: Self-pay | Admitting: Gastroenterology

## 2021-07-14 ENCOUNTER — Ambulatory Visit: Admit: 2021-07-14 | Payer: Medicare Other | Admitting: Gastroenterology

## 2021-07-14 ENCOUNTER — Other Ambulatory Visit: Payer: Self-pay

## 2021-07-14 DIAGNOSIS — R1319 Other dysphagia: Secondary | ICD-10-CM

## 2021-07-14 SURGERY — ESOPHAGOGASTRODUODENOSCOPY (EGD) WITH PROPOFOL
Anesthesia: General

## 2021-07-14 NOTE — Telephone Encounter (Signed)
Reece Packer called from [facility] in [place]. States she is wanting to get a procedure scheduled for the patient for an upper EGD. Requesting a call back asap.  ?

## 2021-07-14 NOTE — Telephone Encounter (Signed)
Documented in other telephone call  

## 2021-07-14 NOTE — Telephone Encounter (Signed)
Called Kristen back and they are going to draw the labs tomorrow at facility. Schedule EGD for 07/27/2021 with Dr. Marius Ditch gave instructions on how to hold the Eliquis and instructions for procedure. Mailed a copy also  ?

## 2021-07-14 NOTE — Telephone Encounter (Signed)
Called Kristen back and she in a meeting till 10:15.  ?

## 2021-07-14 NOTE — Telephone Encounter (Signed)
Stop blood thinner 3 days prior to procedure and restart it 1 day after procedure. Per fax scanned in [employer].  ?Order lab work.  ?[employer] and they said that she must be still in a meeting because she did not pick up  ?

## 2021-07-14 NOTE — Addendum Note (Signed)
Addended by: Radene Knee L on: 07/14/2021 10:47 AM ? ? Modules accepted: Orders ? ?

## 2021-07-21 ENCOUNTER — Telehealth: Payer: Self-pay

## 2021-07-21 MED ORDER — FLUCONAZOLE 100 MG PO TABS: 100.0000 mg | ORAL_TABLET | Freq: Every day | ORAL | 0 refills | Status: AC

## 2021-07-21 NOTE — Telephone Encounter (Signed)
-----   Message from Wyline Mood, MD sent at 07/12/2021  1:07 PM EDT ----- ?Inform needs diflucan for candida - 100 mg once daily for 14 days .  ?

## 2021-07-27 ENCOUNTER — Ambulatory Visit: Payer: Medicare Other | Admitting: Certified Registered"

## 2021-07-27 ENCOUNTER — Ambulatory Visit
Admission: RE | Admit: 2021-07-27 | Discharge: 2021-07-27 | Disposition: A | Discharge status: Home / Self Care | Payer: Medicare Other | Attending: Gastroenterology | Admitting: Gastroenterology

## 2021-07-27 ENCOUNTER — Encounter: Payer: Self-pay | Admitting: Gastroenterology

## 2021-07-27 ENCOUNTER — Encounter
Admission: RE | Disposition: A | Discharge status: Home / Self Care | Payer: Self-pay | Source: Home / Self Care | Attending: Gastroenterology

## 2021-07-27 DIAGNOSIS — K2940 Chronic atrophic gastritis without bleeding: Secondary | ICD-10-CM | POA: Diagnosis not present

## 2021-07-27 DIAGNOSIS — B9681 Helicobacter pylori [H. pylori] as the cause of diseases classified elsewhere: Secondary | ICD-10-CM | POA: Diagnosis not present

## 2021-07-27 DIAGNOSIS — R1319 Other dysphagia: Secondary | ICD-10-CM

## 2021-07-27 DIAGNOSIS — K3189 Other diseases of stomach and duodenum: Secondary | ICD-10-CM | POA: Diagnosis not present

## 2021-07-27 DIAGNOSIS — K317 Polyp of stomach and duodenum: Secondary | ICD-10-CM | POA: Diagnosis present

## 2021-07-27 HISTORY — DX: Depression, unspecified: F32.A

## 2021-07-27 HISTORY — DX: Chronic kidney disease, unspecified: N18.9

## 2021-07-27 HISTORY — DX: Anxiety disorder, unspecified: F41.9

## 2021-07-27 HISTORY — PX: ESOPHAGOGASTRODUODENOSCOPY (EGD) WITH PROPOFOL: SHX5813

## 2021-07-27 SURGERY — ESOPHAGOGASTRODUODENOSCOPY (EGD) WITH PROPOFOL
Anesthesia: General

## 2021-07-27 MED ORDER — SODIUM CHLORIDE 0.9 % IV SOLN: INTRAVENOUS | Status: DC

## 2021-07-27 MED ORDER — LIDOCAINE HCL (CARDIAC) PF 100 MG/5ML IV SOSY
PREFILLED_SYRINGE | INTRAVENOUS | Status: DC | PRN
  Administered 2021-07-27: 50 mg via INTRAVENOUS

## 2021-07-27 MED ORDER — PROPOFOL 10 MG/ML IV BOLUS
INTRAVENOUS | Status: DC | PRN
  Administered 2021-07-27: 50 mg via INTRAVENOUS
  Administered 2021-07-27: 20 mg via INTRAVENOUS
  Administered 2021-07-27: 30 mg via INTRAVENOUS

## 2021-07-27 NOTE — Transfer of Care (Signed)
Immediate Anesthesia Transfer of Care Note ? ?Patient: Katrina Chen ? ?Procedure(s) Performed: ESOPHAGOGASTRODUODENOSCOPY (EGD) WITH PROPOFOL ? ?Patient Location: PACU and Endoscopy Unit ? ?Anesthesia Type:MAC ? ?Level of Consciousness: drowsy ? ?Airway & Oxygen Therapy: Patient Spontanous Breathing ? ?Post-op Assessment: Report given to RN and Post -op Vital signs reviewed and stable ? ?Post vital signs: Reviewed and stable ? ?Last Vitals:  ?Vitals Value Taken Time  ?BP 137/67 07/27/21 0901  ?Temp    ?Pulse 60 07/27/21 0902  ?Resp 15 07/27/21 0902  ?SpO2 100 % 07/27/21 0902  ?Vitals shown include unvalidated device data. ? ?Last Pain:  ?Vitals:  ? 07/27/21 0755  ?TempSrc: Temporal  ?PainSc: 1   ?   ? ?  ? ?Complications: No notable events documented. ?

## 2021-07-27 NOTE — Op Note (Signed)
[facility] ?Gastroenterology ?Patient Name: Katrina Chen ?Procedure Date: 07/27/2021 8:05 AM ?MRN: [id number] ?Account #: [id number] ?Date of Birth: 02-23-1936 ?Admit Type: Outpatient ?Age: 86 ?Room: [facility] ENDO ROOM 4 ?Gender: Female ?Note Status: Finalized ?Instrument Name: Upper Endoscope [id number] ?Procedure:             Upper GI endoscopy ?Indications:           For therapy of gastric polyps ?Providers:             Lin Landsman MD, MD ?Referring MD:          Liz Malady FNP ?Medicines:             General Anesthesia ?Complications:         No immediate complications. Estimated blood loss: None. ?Procedure:             Pre-Anesthesia Assessment: ?                       - Prior to the procedure, a History and Physical was  ?                       performed, and patient medications and allergies were  ?                       reviewed. The patient is unable to give consent  ?                       secondary to the patient being legally incompetent to  ?                       consent. The risks and benefits of the procedure and  ?                       the sedation options and risks were discussed with the  ?                       patient's daughter. All questions were answered and  ?                       informed consent was obtained. Patient identification  ?                       and proposed procedure were verified by the physician,  ?                       the nurse, the anesthesiologist, the anesthetist and  ?                       the technician in the pre-procedure area in the  ?                       procedure room in the endoscopy suite. Mental Status  ?                       Examination: alert and oriented. Airway Examination:  ?                       normal oropharyngeal airway and neck mobility.  ?  Respiratory Examination: clear to auscultation. CV  ?                       Examination: normal. Prophylactic Antibiotics: The  ?                       patient  does not require prophylactic antibiotics.  ?                       Prior Anticoagulants: The patient has taken Eliquis  ?                       (apixaban), last dose was 3 days prior to procedure.  ?                       ASA Grade Assessment: III - A patient with severe  ?                       systemic disease. After reviewing the risks and  ?                       benefits, the patient was deemed in satisfactory  ?                       condition to undergo the procedure. The anesthesia  ?                       plan was to use general anesthesia. Immediately prior  ?                       to administration of medications, the patient was  ?                       re-assessed for adequacy to receive sedatives. The  ?                       heart rate, respiratory rate, oxygen saturations,  ?                       blood pressure, adequacy of pulmonary ventilation, and  ?                       response to care were monitored throughout the  ?                       procedure. The physical status of the patient was  ?                       re-assessed after the procedure. ?                       After obtaining informed consent, the endoscope was  ?                       passed under direct vision. Throughout the procedure,  ?                       the patient's blood pressure, pulse, and oxygen  ?  saturations were monitored continuously. The  ?                       Endosonoscope was introduced through the mouth, and  ?                       advanced to the second part of duodenum. The upper GI  ?                       endoscopy was accomplished without difficulty. The  ?                       patient tolerated the procedure well. ?Findings: ?     The duodenal bulb and second portion of the duodenum were normal. ?     Two 4 to 10 mm sessile polyps with no bleeding and no stigmata of recent  ?     bleeding were found in the gastric antrum. Polypectomy was attempted,  ?     initially using a hot  snare. Polyp resection was incomplete with this  ?     device. This intervention then required a different device and  ?     polypectomy technique. The polyp was removed with a cold snare.  ?     Resection and retrieval were complete. To prevent bleeding after the  ?     polypectomy, two hemostatic clips were successfully placed (MR  ?     conditional). There was no bleeding during, or at the end, of the  ?     procedure. ?     Diffuse mildly erythematous mucosa without bleeding was found in the  ?     gastric body. Biopsies were taken with a cold forceps for Helicobacter  ?     pylori testing. ?     The incisura and gastric antrum were normal. Biopsies were taken with a  ?     cold forceps for Helicobacter pylori testing. ?     The cardia and gastric fundus were normal on retroflexion. ?     Esophagogastric landmarks were identified: the gastroesophageal junction  ?     was found at 35 cm from the incisors. ?     The gastroesophageal junction and examined esophagus were normal. ?Impression:            - Normal duodenal bulb and second portion of the  ?                       duodenum. ?                       - Two gastric polyps. Resected and retrieved. Clips  ?                       (MR conditional) were placed. ?                       - Erythematous mucosa in the gastric body. Biopsied. ?                       - Normal incisura and antrum. Biopsied. ?                       - Esophagogastric landmarks identified. ?                       -  Normal gastroesophageal junction and esophagus. ?Recommendation:        - Discharge patient to a nursing home (with escort). ?                       - Resume previous diet today. ?                       - Continue present medications. ?                       - Resume Eliquis (apixaban) in 2 days at prior dose.  ?                       Refer to managing physician for further adjustment of  ?                       therapy. ?                       - Await pathology  results. ?Procedure Code(s):     --- Professional --- ?                       [phone], Esophagogastroduodenoscopy, flexible,  ?                       transoral; with removal of tumor(s), polyp(s), or  ?                       other lesion(s) by snare technique ?                       43239, 59, Esophagogastroduodenoscopy, flexible,  ?                       transoral; with biopsy, single or multiple ?Diagnosis Code(s):     --- Professional --- ?                       K31.7, Polyp of stomach and duodenum ?                       K31.89, Other diseases of stomach and duodenum ?CPT copyright 2019 American Medical Association. All rights reserved. ?The codes documented in this report are preliminary and upon coder review may  ?be revised to meet current compliance requirements. ?Dr. Ulyess Mort ?Yosselin Zoeller Raeanne Gathers MD, MD ?07/27/2021 8:58:06 AM ?This report has been signed electronically. ?Number of Addenda: 0 ?Note Initiated On: 07/27/2021 8:05 AM ?Estimated Blood Loss:  Estimated blood loss: none. ?     [place] ?

## 2021-07-27 NOTE — Progress Notes (Signed)
Patient's daughter [name] was contacted to obtain consent for Ms. Malacara's procedure. Dr. Marius Ditch and Dr. Andree Elk spoke with Caren Griffins via telephone as well. A second RN verified consent. ?

## 2021-07-27 NOTE — H&P (Signed)
?Arlyss Repress, MD ?[address]  ?[address]  ?[place], [place] [address]  ?Main: [phone]  ?Fax: [phone] ?Pager: [phone] ? ?Primary Care Physician:  Wardell Honour, FNP ?Primary Gastroenterologist:  Dr. Arlyss Repress ? ?Pre-Procedure History & Physical: ?HPI:  Katrina Chen is a 86 y.o. adult is here for an endoscopy. ?  ?Past Medical History:  ?Diagnosis Date  ? A-fib (HCC)   ? Hyperlipidemia   ? Hypertension   ? Iron deficiency   ? Vascular dementia (HCC)   ? ? ?Past Surgical History:  ?Procedure Laterality Date  ? ESOPHAGOGASTRODUODENOSCOPY (EGD) WITH PROPOFOL N/A 07/01/2021  ? Procedure: ESOPHAGOGASTRODUODENOSCOPY (EGD) WITH PROPOFOL;  Surgeon: Wyline Mood, MD;  Location: [facility] ENDOSCOPY;  Service: Gastroenterology;  Laterality: N/A;  ? PACEMAKER IMPLANT    ? ? ?Prior to Admission medications   ?Medication Sig Start Date End Date Taking? Authorizing Provider  ?carvedilol (COREG) 25 MG tablet Take 25 mg by mouth 2 (two) times daily. 05/24/21  Yes [provider]  ?divalproex (DEPAKOTE SPRINKLE) 125 MG capsule Take 125 mg by mouth 2 (two) times daily. 02/18/21  Yes [provider]  ?Aleda Grana ALLERGY RELIEF 50 MCG/ACT nasal spray Place 1 spray into both nostrils daily. 05/31/21  Yes [provider]  ?fluconazole (DIFLUCAN) 100 MG tablet Take 1 tablet (100 mg total) by mouth daily for 14 days. 07/21/21 08/04/21 Yes Wyline Mood, MD  ?gabapentin (NEURONTIN) 100 MG capsule Take 100 mg by mouth every 8 (eight) hours. 11/03/20  Yes [provider]  ?hydrALAZINE (APRESOLINE) 25 MG tablet Take 25 mg by mouth 3 (three) times daily. 06/24/21  Yes [provider]  ?losartan (COZAAR) 100 MG tablet Take 100 mg by mouth daily. 10/18/20  Yes [provider]  ?venlafaxine XR (EFFEXOR-XR) 75 MG 24 hr capsule Take 75 mg by mouth daily. 06/20/21  Yes [provider]  ?bisacodyl (DULCOLAX) 10 MG suppository Place 1 suppository (10 mg total) rectally daily as  needed for mild constipation. 11/08/20   Tresa Moore, MD  ?celecoxib (CELEBREX) 100 MG capsule Take 1 capsule (100 mg total) by mouth 2 (two) times daily as needed. 11/16/20   Burnadette Pop, MD  ?cloNIDine (CATAPRES - DOSED IN MG/24 HR) 0.2 mg/24hr patch Place 0.2 mg onto the skin once a week. 06/02/21   [provider]  ?cycloSPORINE (RESTASIS) 0.05 % ophthalmic emulsion Place 1 drop into both eyes 2 (two) times daily. 05/26/21   [provider]  ?ELIQUIS 2.5 MG TABS tablet Take 2.5 mg by mouth 2 (two) times daily. 11/03/20   [provider]  ?levocetirizine (XYZAL) 5 MG tablet Take 5 mg by mouth daily. 06/11/21   [provider]  ?lidocaine (LIDODERM) 5 % Place 1 patch onto the skin daily. Remove & Discard patch within 12 hours or as directed by MD 06/20/21   Tresa Moore, MD  ?melatonin 5 MG TABS Take 10 mg by mouth at bedtime.    [provider]  ?methocarbamol (ROBAXIN) 500 MG tablet Take 1 tablet (500 mg total) by mouth every 8 (eight) hours as needed for muscle spasms. 06/20/21   Tresa Moore, MD  ?OLANZapine (ZYPREXA) 2.5 MG tablet Take 2.5 mg by mouth at bedtime. 10/28/20   [provider]  ?olopatadine (PATANOL) 0.1 % ophthalmic solution Place 1 drop into both eyes 2 (two) times daily.    [provider]  ?oxybutynin (DITROPAN XL) 5 MG 24 hr tablet Take 1 tablet (5 mg total)  by mouth 2 (two) times daily as needed (bladder spasm). 06/20/21   Tresa Moore, MD  ?polyethylene glycol (MIRALAX / GLYCOLAX) 17 g packet Take 17 g by mouth 2 (two) times daily. 11/16/20   Burnadette Pop, MD  ?prazosin (MINIPRESS) 1 MG capsule Take 1 mg by mouth at bedtime. 06/03/21   [provider]  ?senna-docusate (SENOKOT-S) 8.6-50 MG tablet Take 1 tablet by mouth 2 (two) times daily. 11/08/20   Tresa Moore, MD  ?tamsulosin (FLOMAX) 0.4 MG CAPS capsule Take 1 capsule (0.4 mg total) by mouth daily. 11/16/20   Burnadette Pop, MD   ? ? ?Allergies as of 07/14/2021 - Review Complete 07/01/2021  ?Allergen Reaction Noted  ? Iodine Swelling 11/05/2020  ? Amlodipine Other (See Comments) 11/06/2020  ? Nifedipine Swelling and Other (See Comments) 11/06/2020  ? Iodinated contrast media  12/05/2020  ? Lisinopril Swelling 11/06/2020  ? Quinapril Swelling 05/27/2013  ? Shellfish allergy  11/05/2020  ? Simvastatin Swelling 05/27/2013  ? ? ?No family history on file. ? ?Social History  ? ?Socioeconomic History  ? Marital status: Widowed  ?  Spouse name: Not on file  ? Number of children: Not on file  ? Years of education: Not on file  ? Highest education level: Not on file  ?Occupational History  ? Not on file  ?Tobacco Use  ? Smoking status: Never  ? Smokeless tobacco: Never  ?Vaping Use  ? Vaping Use: Never used  ?Substance and Sexual Activity  ? Alcohol use: Never  ? Drug use: Never  ? Sexual activity: Not on file  ?Other Topics Concern  ? Not on file  ?Social History Narrative  ? Not on file  ? ?Social Determinants of Health  ? ?Financial Resource Strain: Not on file  ?Food Insecurity: Not on file  ?Transportation Needs: Not on file  ?Physical Activity: Not on file  ?Stress: Not on file  ?Social Connections: Not on file  ?Intimate Partner Violence: Not on file  ? ? ?Review of Systems: ?See HPI, otherwise negative ROS ? ?Physical Exam: ?BP (!) 188/70   Pulse 61   Temp (!) 96.4 ?F (35.8 ?C) (Temporal)   Resp 17   Ht 5\' 7"  (1.702 m)   Wt 95.3 kg   SpO2 100%   BMI 32.89 kg/m?  ?General:   Alert,  pleasant and cooperative in NAD ?Head:  Normocephalic and atraumatic. ?Neck:  Supple; no masses or thyromegaly. ?Lungs:  Clear throughout to auscultation.    ?Heart:  Regular rate and rhythm. ?Abdomen:  Soft, nontender and nondistended. Normal bowel sounds, without guarding, and without rebound.   ?Neurologic:  Alert and  oriented x4;  grossly normal neurologically. ? ?Impression/Plan: ?Katrina Chen is here for an endoscopy to be performed for removal of  gastric polyp ? ?Risks, benefits, limitations, and alternatives regarding  endoscopy have been reviewed with the patient.  Questions have been answered.  All parties agreeable. ? ? ?Dolores Frame, MD  07/27/2021, 8:03 AM ?

## 2021-07-27 NOTE — Anesthesia Preprocedure Evaluation (Addendum)
Anesthesia Evaluation  ?Patient identified by MRN, date of birth, ID band ?Patient awake ? ? ? ?Reviewed: ?Allergy & Precautions, H&P , NPO status , Patient's Chart, lab work & pertinent test results, reviewed documented beta blocker date and time  ? ?Airway ?Mallampati: II ? ? ?Neck ROM: full ? ? ? Dental ? ?(+) Poor Dentition, Edentulous Upper, Edentulous Lower ?  ?Pulmonary ?shortness of breath and with exertion,  ?  ?Pulmonary exam normal ? ? ? ? ? ? ? Cardiovascular ?Exercise Tolerance: Poor ?hypertension, On Medications ?+ CAD  ?Normal cardiovascular exam ?Rhythm:regular Rate:Normal ? ? ?  ?Neuro/Psych ?PSYCHIATRIC DISORDERS Anxiety Depression Bipolar Disorder Dementia negative neurological ROS ?   ? GI/Hepatic ?negative GI ROS, Neg liver ROS,   ?Endo/Other  ?negative endocrine ROS ? Renal/GU ?Renal disease  ?negative genitourinary ?  ?Musculoskeletal ? ? Abdominal ?  ?Peds ? Hematology ?negative hematology ROS ?(+)   ?Anesthesia Other Findings ?Past Medical History: ?No date: A-fib [facility]) ?No date: Anxiety ?No date: Chronic kidney disease ?No date: Depression ?No date: Hyperlipidemia ?No date: Hypertension ?No date: Iron deficiency ?No date: Vascular dementia ([place]) ?Past Surgical History: ?07/01/2021: ESOPHAGOGASTRODUODENOSCOPY (EGD) WITH PROPOFOL; N/A ?    Comment:  Procedure: ESOPHAGOGASTRODUODENOSCOPY (EGD) WITH  ?             PROPOFOL;  Surgeon: Jonathon Bellows, MD;  Location: [facility]  ?             ENDOSCOPY;  Service: Gastroenterology;  Laterality: N/A; ?No date: PACEMAKER IMPLANT ?BMI   ? Body Mass Index: 32.89 kg/m?  ?  ? Reproductive/Obstetrics ?negative OB ROS ? ?  ? ? ? ? ? ? ? ? ? ? ? ? ? ?  ?  ? ? ? ? ? ? ? ? ?Anesthesia Physical ?Anesthesia Plan ? ?ASA: 4 ? ?Anesthesia Plan: General  ? ?Post-op Pain Management:   ? ?Induction:  ? ?PONV Risk Score and Plan:  ? ?Airway Management Planned:  ? ?Additional Equipment:  ? ?Intra-op Plan:  ? ?Post-operative Plan:  ? ?Informed  Consent: I have reviewed the patients History and Physical, chart, labs and discussed the procedure including the risks, benefits and alternatives for the proposed anesthesia with the patient or authorized representative who has indicated his/her understanding and acceptance.  ? ? ? ?Dental Advisory Given ? ?Plan Discussed with: CRNA ? ?Anesthesia Plan Comments: (Consent per daughter. Pt at moderate risk secondary to hx of pulmonary edema as discussed with daughter. ja)  ? ? ? ? ? ?Anesthesia Quick Evaluation ? ?

## 2021-07-28 LAB — SURGICAL PATHOLOGY

## 2021-07-28 NOTE — Anesthesia Postprocedure Evaluation (Signed)
Anesthesia Post Note  Patient: Avynn Klassen  Procedure(s) Performed: ESOPHAGOGASTRODUODENOSCOPY (EGD) WITH PROPOFOL  Patient location during evaluation: PACU Anesthesia Type: General Level of consciousness: awake and alert Pain management: pain level controlled Vital Signs Assessment: post-procedure vital signs reviewed and stable Respiratory status: spontaneous breathing, nonlabored ventilation, respiratory function stable and patient connected to nasal cannula oxygen Cardiovascular status: blood pressure returned to baseline and stable Postop Assessment: no apparent nausea or vomiting Anesthetic complications: no   No notable events documented.   Last Vitals:  Vitals:   07/27/21 0858 07/27/21 0930  BP: (!) 134/56   Pulse:    Resp:    Temp: (!) 35.6 C (!) 36.1 C  SpO2:      Last Pain:  Vitals:   07/27/21 0935  TempSrc:   PainSc: 0-No pain                 Yevette Edwards

## 2021-07-29 ENCOUNTER — Telehealth: Payer: Self-pay

## 2021-07-29 ENCOUNTER — Encounter: Payer: Self-pay | Admitting: Gastroenterology

## 2021-07-29 DIAGNOSIS — A048 Other specified bacterial intestinal infections: Secondary | ICD-10-CM

## 2021-07-29 MED ORDER — CLARITHROMYCIN 500 MG PO TABS
500.0000 mg | ORAL_TABLET | Freq: Two times a day (BID) | ORAL | 0 refills | Status: AC
Start: 2021-07-29 — End: 2021-08-12

## 2021-07-29 MED ORDER — OMEPRAZOLE 20 MG PO CPDR: 20.0000 mg | DELAYED_RELEASE_CAPSULE | Freq: Two times a day (BID) | ORAL | 0 refills | Status: DC

## 2021-07-29 MED ORDER — AMOXICILLIN 500 MG PO TABS: 1000.0000 mg | ORAL_TABLET | Freq: Two times a day (BID) | ORAL | 0 refills | Status: AC

## 2021-07-29 NOTE — Telephone Encounter (Signed)
-----   Message from Toney Reil, MD sent at 07/28/2021  4:07 PM EDT ----- Pathology results from upper endoscopy confirmed that she has H. pylori infection.  Recommend triple therapy for 2 weeks  Omeprazole 20 mg p.o. twice daily Amoxicillin 1 g twice daily Clarithromycin 500 mg twice daily  Recommend H. pylori breath test 4-6 weeks after completion of therapy  Rohini Vanga

## 2021-07-29 NOTE — Telephone Encounter (Signed)
Called and left a message for call back. Sent medication to the pharmacy and order the H pylori breath test

## 2021-08-01 ENCOUNTER — Encounter: Payer: Self-pay | Admitting: Gastroenterology

## 2021-08-01 NOTE — Telephone Encounter (Signed)
Patient daughter verbalized understanding of results. She will make sure patient gets the medication and take them

## 2021-08-05 ENCOUNTER — Non-Acute Institutional Stay: Payer: Medicare Other | Admitting: Primary Care

## 2021-08-05 DIAGNOSIS — Z515 Encounter for palliative care: Secondary | ICD-10-CM

## 2021-08-05 DIAGNOSIS — R531 Weakness: Secondary | ICD-10-CM

## 2021-08-05 DIAGNOSIS — K2940 Chronic atrophic gastritis without bleeding: Secondary | ICD-10-CM

## 2021-08-05 DIAGNOSIS — K317 Polyp of stomach and duodenum: Secondary | ICD-10-CM

## 2021-08-05 NOTE — Progress Notes (Signed)
[facility] Consult Note Telephone: [phone]  Fax: [phone]    Date of encounter: 08/05/21 1:32 PM PATIENT NAME: Nora Sabey [address] [place] [address]   [phone] (home)  DOB: 1935-10-15 MRN: [id number] PRIMARY CARE PROVIDER:    Cephas Darby, [place],  [address] [place] [address] [phone]  REFERRING PROVIDER:   Cephas Darby, [address] [place],  [place] [address] [phone]  RESPONSIBLE PARTY:    Contact Information     Name Relation Home Work Mobile   Kimball,Cynthia Daughter   [phone]        I met face to face with patient in [facility] facility. Palliative Care was asked to follow this patient by consultation request of  Cephas Darby, FNP to address advance care planning and complex medical decision making. This is a follow up visit.                                   ASSESSMENT AND PLAN / RECOMMENDATIONS:   Advance Care Planning/Goals of Care: Goals include to maximize quality of life and symptom management. Patient/health care surrogate gave his/her permission to discuss. Identification of a healthcare agent - Daughter Caren Griffins Attempted to call her with number on file at SNF, number rang, left message on VM Review of an  advance directive document . Currently has no antibiotics on her advance care plan, I will attempt to reach out to family to discuss.  CODE STATUS: DNR Most on file  I reviewed facility MOST form today. The patient and family outlined their wishes for the following treatment decisions:  Cardiopulmonary Resuscitation: Do Not Attempt Resuscitation (DNR/No CPR)  Medical Interventions: Comfort Measures: Keep clean, warm, and dry. Use medication by any route, positioning, wound care, and other measures to relieve pain and suffering. Use oxygen, suction and manual treatment of airway obstruction as needed for comfort. Do not transfer to the hospital unless comfort needs  cannot be met in current location.  Antibiotics: No antibiotics (use other measures to relieve symptoms)  IV Fluids: No IV fluids (provide other measures to ensure comfort)  Feeding Tube: No feeding tube   Symptom Management/Plan:  Met with  patient in SNF, she was having lunch. She states she still some discomfort with eating. She is eating ice cream but not her meal. She denies oral pain currently. She states she is now mostly in bed, over the last 3-4 months she has stopped being oob daily to a chair for an extended period of time.  Albumin 3.3 in hospital, intake ranges from 76-100% most day with some taking of less, as in today. She has a 20 lb weight gain from my last visit in Dec. 22. She recently was Dx with H pylori and is currently taking antibiotic therapy for  that.   Called POA to introduce self, in the past I have not been able to leave a message. Did so today to speak with her for non urgent discussion. MOST on file and uploaded for ACP directives.   Follow up Palliative Care Visit: Palliative care will continue to follow for complex medical decision making, advance care planning, and clarification of goals. Return 6 weeks or prn.   This visit was coded based on medical decision making (MDM).  PPS: 30%  HOSPICE ELIGIBILITY/DIAGNOSIS: TBD  Chief Complaint: debility, immobility  HISTORY OF PRESENT ILLNESS:  Aida Raider  is a 86 y.o. year old adult  with debility, immobility, CKD 3, h/o bipolar disorder . Patient seen today to review palliative care needs to include medical decision making and advance care planning as appropriate.   History obtained from review of EMR, discussion with primary team, and interview with family, facility staff/caregiver and/or Ms. Tortorella.  I reviewed available labs, medications, imaging, studies and related documents from the EMR.  Records reviewed and summarized above.   ROS  General: NAD ENMT: denies dysphagia Pulmonary: denies cough, denies  increased SOB Abdomen: endorses fair  appetite, denies constipation, endorses incontinence of bowel GU: denies dysuria, endorses incontinence of urine MSK:  endorses  increased weakness,  no falls reported Skin: denies rashes or wounds Neurological: denies pain, denies insomnia Psych: Endorses depressed mood  Physical Exam: Current and past weights: 210 lbs, weight gain Constitutional: NAD General: frail appearing EYES: anicteric sclera, lids intact, no discharge  ENMT: intact hearing, oral mucous membranes moist CV:  no LE edema Pulmonary:  no increased work of breathing, no cough, room air Abdomen: intake 75%,  MSK: no sarcopenia, moves all extremities, non ambulatory Skin: warm and dry, no rashes or wounds on visible skin Neuro:  +generalized weakness,  +cognitive impairment, slight anxious affect   Thank you for the opportunity to participate in the care of Ms. Decoste.  The palliative care team will continue to follow. Please call our office at [phone] if we can be of additional assistance.   Jason Coop, NP DNP, AGPCNP-BC  COVID-19 PATIENT SCREENING TOOL Asked and negative response unless otherwise noted:   Have you had symptoms of covid, tested positive or been in contact with someone with symptoms/positive test in the past 5-10 days?

## 2021-09-30 ENCOUNTER — Non-Acute Institutional Stay: Payer: Medicare Other | Admitting: Primary Care

## 2021-09-30 DIAGNOSIS — R54 Age-related physical debility: Secondary | ICD-10-CM

## 2021-09-30 DIAGNOSIS — R531 Weakness: Secondary | ICD-10-CM

## 2021-09-30 DIAGNOSIS — Z515 Encounter for palliative care: Secondary | ICD-10-CM

## 2021-09-30 NOTE — Progress Notes (Signed)
[place] Consult Note Telephone: [phone]  Fax: [phone]    Date of encounter: 09/30/21 2:45 PM PATIENT NAME: Katrina Chen [address] [place] [address]   [phone] (home)  DOB: 1935/09/16 MRN: [id number] PRIMARY CARE PROVIDER:    Cephas Chen, [place],  [address] [place] [address] [phone]  REFERRING PROVIDER:   Cephas Chen, [address] [place],  [place] [address] [phone]  RESPONSIBLE PARTY:    Contact Information     Name Relation Home Work Mobile   Katrina Chen,Katrina Chen Daughter   [phone]        [facility]. Palliative Care was asked to follow this patient by consultation request of  Katrina Darby, FNP to address advance care planning and complex medical decision making. This is a follow up visit.                                   ASSESSMENT AND PLAN / RECOMMENDATIONS:   Advance Care Planning/Goals of Care: Goals include to maximize quality of life and symptom management. Patient/health care surrogate gave his/her permission to discuss. CODE STATUS: DNR  Symptom Management/Plan:  I met with patient in a nursing home room. She's less interactive today than previous visits. Her speech seems to be more Difficult, less specific.   Gastric Pain: She's able to confirm that her stomach still hurts and her mouth still hurts but it's not clear if she understands assessment questions. However she appears constipated by record. I recommend MOM now, and  continue senna 2 bid.  Edema/ CHF: She does stay she just had lunch. She endorses some lower extremity pain and there is 2+ edema. I would recommend elevating lower extremities when sitting. On many medications for bp and edema  MSK: Endorses pain: Recommend round the clock Tylenol  CR for chronic pain.   Follow up Palliative Care Visit: Palliative care will continue to follow for complex medical decision  making, advance care planning, and clarification of goals. Return 6 weeks or prn.  This visit was coded based on medical decision making (MDM).  PPS: 30%  HOSPICE ELIGIBILITY/DIAGNOSIS: TBD  Chief Complaint: pain  HISTORY OF PRESENT ILLNESS:  Katrina Chen is a 86 y.o. year old adult  with constipation, gastric pain, chronic pain, dementia . Patient seen today to review palliative care needs to include medical decision making and advance care planning as appropriate.   History obtained from review of EMR, discussion with primary team, and interview with family, facility staff/caregiver and/or Ms. Katrina Chen.  I reviewed available labs, medications, imaging, studies and related documents from the EMR.  Records reviewed and summarized above.   ROS  Katrina Chen General: NAD ENMT: denies dysphagia Pulmonary: denies cough, denies increased SOB Abdomen: endorses good appetite, denies constipation, endorses incontinence of bowel GU: denies dysuria, endorses incontinence of urine MSK:   endorses increased weakness,  no falls reported Skin: denies rashes or wounds Neurological:endorses pain, denies insomnia Psych: Endorses withdrawn mood  Physical Exam: Current and past weights: stable Constitutional: NAD General: frail appearing EYES: anicteric sclera, lids intact, no discharge  ENMT: intact hearing, oral mucous membranes moist, CV: 2+ jbilLE edema Pulmonary: no increased work of breathing, no cough, room air Abdomen: intake 70% MSK: + sarcopenia, moves all extremities, non  ambulatory Skin: warm and dry, no rashes or wounds on visible skin  Neuro:  + generalized weakness,  + cognitive impairment, +anxious affect   Thank you for the opportunity to participate in the care of Katrina Chen.  The palliative care team will continue to follow. Please call our office at [phone] if we can be of additional assistance.   Katrina Coop, NP DNP, AGPCNP-BC  COVID-19 PATIENT SCREENING  TOOL Asked and negative response unless otherwise noted:   Have you had symptoms of covid, tested positive or been in contact with someone with symptoms/positive test in the past 5-10 days?

## 2021-10-08 ENCOUNTER — Emergency Department: Payer: Medicare Other

## 2021-10-08 ENCOUNTER — Inpatient Hospital Stay
Admission: EM | Admit: 2021-10-08 | Discharge: 2021-10-10 | DRG: 563 | Disposition: A | Discharge status: Skilled Nursing Facility | Payer: Medicare Other | Source: Skilled Nursing Facility | Attending: Internal Medicine | Admitting: Internal Medicine

## 2021-10-08 DIAGNOSIS — Z993 Dependence on wheelchair: Secondary | ICD-10-CM

## 2021-10-08 DIAGNOSIS — E871 Hypo-osmolality and hyponatremia: Secondary | ICD-10-CM | POA: Diagnosis present

## 2021-10-08 DIAGNOSIS — R296 Repeated falls: Secondary | ICD-10-CM | POA: Diagnosis present

## 2021-10-08 DIAGNOSIS — N183 Chronic kidney disease, stage 3 unspecified: Secondary | ICD-10-CM | POA: Diagnosis present

## 2021-10-08 DIAGNOSIS — Z20822 Contact with and (suspected) exposure to covid-19: Secondary | ICD-10-CM | POA: Diagnosis present

## 2021-10-08 DIAGNOSIS — Z8659 Personal history of other mental and behavioral disorders: Secondary | ICD-10-CM

## 2021-10-08 DIAGNOSIS — I2510 Atherosclerotic heart disease of native coronary artery without angina pectoris: Secondary | ICD-10-CM | POA: Diagnosis present

## 2021-10-08 DIAGNOSIS — S82832A Other fracture of upper and lower end of left fibula, initial encounter for closed fracture: Secondary | ICD-10-CM | POA: Diagnosis not present

## 2021-10-08 DIAGNOSIS — W19XXXA Unspecified fall, initial encounter: Secondary | ICD-10-CM | POA: Diagnosis present

## 2021-10-08 DIAGNOSIS — Z96653 Presence of artificial knee joint, bilateral: Secondary | ICD-10-CM | POA: Diagnosis present

## 2021-10-08 DIAGNOSIS — E785 Hyperlipidemia, unspecified: Secondary | ICD-10-CM | POA: Diagnosis present

## 2021-10-08 DIAGNOSIS — I482 Chronic atrial fibrillation, unspecified: Secondary | ICD-10-CM | POA: Diagnosis present

## 2021-10-08 DIAGNOSIS — I10 Essential (primary) hypertension: Secondary | ICD-10-CM | POA: Diagnosis present

## 2021-10-08 DIAGNOSIS — F0153 Vascular dementia, unspecified severity, with mood disturbance: Secondary | ICD-10-CM | POA: Diagnosis present

## 2021-10-08 DIAGNOSIS — I129 Hypertensive chronic kidney disease with stage 1 through stage 4 chronic kidney disease, or unspecified chronic kidney disease: Secondary | ICD-10-CM | POA: Diagnosis present

## 2021-10-08 DIAGNOSIS — R4182 Altered mental status, unspecified: Secondary | ICD-10-CM | POA: Diagnosis not present

## 2021-10-08 DIAGNOSIS — F319 Bipolar disorder, unspecified: Secondary | ICD-10-CM | POA: Diagnosis present

## 2021-10-08 DIAGNOSIS — N1832 Chronic kidney disease, stage 3b: Secondary | ICD-10-CM | POA: Diagnosis present

## 2021-10-08 DIAGNOSIS — Z91013 Allergy to seafood: Secondary | ICD-10-CM

## 2021-10-08 DIAGNOSIS — Z888 Allergy status to other drugs, medicaments and biological substances status: Secondary | ICD-10-CM

## 2021-10-08 DIAGNOSIS — Y92129 Unspecified place in nursing home as the place of occurrence of the external cause: Secondary | ICD-10-CM

## 2021-10-08 DIAGNOSIS — Z950 Presence of cardiac pacemaker: Secondary | ICD-10-CM

## 2021-10-08 DIAGNOSIS — Z7901 Long term (current) use of anticoagulants: Secondary | ICD-10-CM

## 2021-10-08 DIAGNOSIS — S82839A Other fracture of upper and lower end of unspecified fibula, initial encounter for closed fracture: Secondary | ICD-10-CM

## 2021-10-08 DIAGNOSIS — F0390 Unspecified dementia without behavioral disturbance: Secondary | ICD-10-CM

## 2021-10-08 DIAGNOSIS — M48061 Spinal stenosis, lumbar region without neurogenic claudication: Secondary | ICD-10-CM | POA: Diagnosis present

## 2021-10-08 DIAGNOSIS — Z91041 Radiographic dye allergy status: Secondary | ICD-10-CM

## 2021-10-08 DIAGNOSIS — N179 Acute kidney failure, unspecified: Secondary | ICD-10-CM | POA: Diagnosis present

## 2021-10-08 DIAGNOSIS — Z66 Do not resuscitate: Secondary | ICD-10-CM | POA: Diagnosis present

## 2021-10-08 DIAGNOSIS — Z79899 Other long term (current) drug therapy: Secondary | ICD-10-CM

## 2021-10-08 LAB — CBC WITH DIFFERENTIAL/PLATELET
Abs Immature Granulocytes: 0.05 10*3/uL (ref 0.00–0.07)
Basophils Absolute: 0 10*3/uL (ref 0.0–0.1)
Basophils Relative: 0 %
Eosinophils Absolute: 0 10*3/uL (ref 0.0–0.5)
Eosinophils Relative: 0 %
HCT: 32.4 % — ABNORMAL LOW (ref 36.0–46.0)
Hemoglobin: 9.8 g/dL — ABNORMAL LOW (ref 12.0–15.0)
Immature Granulocytes: 1 %
Lymphocytes Relative: 14 %
Lymphs Abs: 1.1 10*3/uL (ref 0.7–4.0)
MCH: 23.1 pg — ABNORMAL LOW (ref 26.0–34.0)
MCHC: 30.2 g/dL (ref 30.0–36.0)
MCV: 76.2 fL — ABNORMAL LOW (ref 80.0–100.0)
Monocytes Absolute: 1.1 10*3/uL — ABNORMAL HIGH (ref 0.1–1.0)
Monocytes Relative: 13 %
Neutro Abs: 5.8 10*3/uL (ref 1.7–7.7)
Neutrophils Relative %: 72 %
Platelets: 285 10*3/uL (ref 150–400)
RBC: 4.25 MIL/uL (ref 3.87–5.11)
RDW: 16.1 % — ABNORMAL HIGH (ref 11.5–15.5)
WBC: 8.1 10*3/uL (ref 4.0–10.5)
nRBC: 0 % (ref 0.0–0.2)

## 2021-10-08 LAB — COMPREHENSIVE METABOLIC PANEL
ALT: 11 U/L (ref 0–44)
AST: 21 U/L (ref 15–41)
Albumin: 3.4 g/dL — ABNORMAL LOW (ref 3.5–5.0)
Alkaline Phosphatase: 81 U/L (ref 38–126)
Anion gap: 8 (ref 5–15)
BUN: 12 mg/dL (ref 8–23)
CO2: 28 mmol/L (ref 22–32)
Calcium: 9.2 mg/dL (ref 8.9–10.3)
Chloride: 88 mmol/L — ABNORMAL LOW (ref 98–111)
Creatinine, Ser: 1.24 mg/dL — ABNORMAL HIGH (ref 0.44–1.00)
GFR, Estimated: 43 mL/min — ABNORMAL LOW (ref >60)
Glucose, Bld: 100 mg/dL — ABNORMAL HIGH (ref 70–99)
Potassium: 4.7 mmol/L (ref 3.5–5.1)
Sodium: 124 mmol/L — ABNORMAL LOW (ref 135–145)
Total Bilirubin: 0.6 mg/dL (ref 0.3–1.2)
Total Protein: 6.9 g/dL (ref 6.5–8.1)

## 2021-10-08 LAB — TROPONIN I (HIGH SENSITIVITY): Troponin I (High Sensitivity): 13 ng/L (ref <18)

## 2021-10-08 NOTE — ED Provider Notes (Signed)
[facility] Provider Note    Event Date/Time   First MD Initiated Contact with Patient 10/08/21 2302     (approximate)   History   Altered Mental Status (Patient brought in by EMS from [facility]; EMS states that staff told them that she was altered but did not explain what her baseline function is; EMS also reports multiple falls but does now know when or how many falls have occurred; Patient's LEFT ankle and calf are tender and hot to touch; Patient is nonverbal and grunts, but can answer yes and no questions) and Abnormal Lab (SNF reported to EMS that her sodium was low but could not tell them what the value was nor could they look it up because they're computers were not working)   HPI  Level V caveat: Limited by altered mentation  Katrina Chen is a 86 y.o. adult brought to the ED via [facility] with a chief complaint of altered mentation.  Nursing home staff did not tell EMS what patient's baseline mental state is.  Staff also reports multiple falls.  Left ankle and calf noted to be tender and hot to the touch.  Reportedly patient's sodium was also low but staff unable to tell EMS what the number was.  Rest of history is limited secondary to patient's decreased LOC.     Past Medical History   Past Medical History:  Diagnosis Date   A-fib [facility])    Anxiety    Chronic kidney disease    Depression    Hyperlipidemia    Hypertension    Iron deficiency    Vascular dementia [facility])      Active Problem List   Patient Active Problem List   Diagnosis Date Noted   Gastric atrophy    Gastric polyps    Acute UTI 06/16/2021   General weakness 06/16/2021   Pleural effusion w/ enlarged cardiac silhouette on CXR, trace edema 06/16/2021   Spinal stenosis of lumbar region 11/12/2020   Nonspecific chest pain 11/05/2020   CAD (coronary artery disease) 11/05/2020   Hyperkalemia 11/05/2020   Obstipation 11/05/2020   History of bipolar disorder  11/05/2020   Atrial fibrillation, chronic (HCC) 11/05/2020   CKD (chronic kidney disease), stage III (HCC) 11/05/2020   Weakness 11/05/2020   Protein-calorie malnutrition (HCC) 11/05/2020   Disorder of refraction 06/05/2019   Dry eye syndrome of both eyes 06/05/2019   Posterior vitreous detachment of left eye 06/05/2019   Presence of intraocular lens 06/05/2019   Bipolar I disorder (HCC) 12/11/2013   Status post total bilateral knee replacement 05/27/2013   Previous back surgery 05/27/2013   Essential hypertension 04/22/2013   Knee pain, chronic 04/22/2013     Past Surgical History   Past Surgical History:  Procedure Laterality Date   ESOPHAGOGASTRODUODENOSCOPY (EGD) WITH PROPOFOL N/A 07/01/2021   Procedure: ESOPHAGOGASTRODUODENOSCOPY (EGD) WITH PROPOFOL;  Surgeon: Wyline Mood, MD;  Location: [facility] ENDOSCOPY;  Service: Gastroenterology;  Laterality: N/A;   ESOPHAGOGASTRODUODENOSCOPY (EGD) WITH PROPOFOL N/A 07/27/2021   Procedure: ESOPHAGOGASTRODUODENOSCOPY (EGD) WITH PROPOFOL;  Surgeon: Toney Reil, MD;  Location: [facility] ENDOSCOPY;  Service: Gastroenterology;  Laterality: N/A;   PACEMAKER IMPLANT       Home Medications   Prior to Admission medications   Medication Sig Start Date End Date Taking? Authorizing Provider  bisacodyl (DULCOLAX) 10 MG suppository Place 1 suppository (10 mg total) rectally daily as needed for mild constipation. 11/08/20   Tresa Moore, MD  carvedilol (COREG) 25 MG  tablet Take 25 mg by mouth 2 (two) times daily. 05/24/21   [provider]  celecoxib (CELEBREX) 100 MG capsule Take 1 capsule (100 mg total) by mouth 2 (two) times daily as needed. Patient not taking: Reported on 09/30/2021 11/16/20   Shelly Coss, MD  cloNIDine (CATAPRES - DOSED IN MG/24 HR) 0.2 mg/24hr patch Place 0.2 mg onto the skin once a week. 06/02/21   [provider]  cycloSPORINE (RESTASIS) 0.05 % ophthalmic emulsion Place 1 drop into both eyes 2 (two) times  daily. 05/26/21   [provider]  divalproex (DEPAKOTE SPRINKLE) 125 MG capsule Take 125 mg by mouth 2 (two) times daily. 02/18/21   [provider]  ELIQUIS 2.5 MG TABS tablet Take 2.5 mg by mouth 2 (two) times daily. 11/03/20   [provider]  FLONASE ALLERGY RELIEF 50 MCG/ACT nasal spray Place 1 spray into both nostrils daily. 05/31/21   [provider]  furosemide (LASIX) 20 MG tablet Take 20 mg by mouth daily. 09/30/21   [provider]  gabapentin (NEURONTIN) 100 MG capsule Take 100 mg by mouth every 8 (eight) hours. 11/03/20   [provider]  hydrALAZINE (APRESOLINE) 25 MG tablet Take 25 mg by mouth 3 (three) times daily. 06/24/21   [provider]  levocetirizine (XYZAL) 5 MG tablet Take 5 mg by mouth daily. 06/11/21   [provider]  lidocaine (LIDODERM) 5 % Place 1 patch onto the skin daily. Remove & Discard patch within 12 hours or as directed by MD Patient not taking: Reported on 09/30/2021 06/20/21   Sidney Ace, MD  losartan (COZAAR) 100 MG tablet Take 100 mg by mouth daily. 10/18/20   [provider]  melatonin 5 MG TABS Take 10 mg by mouth at bedtime.    [provider]  methocarbamol (ROBAXIN) 500 MG tablet Take 1 tablet (500 mg total) by mouth every 8 (eight) hours as needed for muscle spasms. 06/20/21   Sreenath, Sudheer B, MD  OLANZapine (ZYPREXA) 2.5 MG tablet Take 2.5 mg by mouth at bedtime. 10/28/20   [provider]  olopatadine (PATANOL) 0.1 % ophthalmic solution Place 1 drop into both eyes 2 (two) times daily.    [provider]  omeprazole (PRILOSEC) 20 MG capsule Take 1 capsule (20 mg total) by mouth 2 (two) times daily before a meal for 14 days. 07/29/21 09/30/21  Lin Landsman, MD  ondansetron (ZOFRAN-ODT) 4 MG disintegrating tablet Take by mouth. 07/14/21   [provider]  oxybutynin (DITROPAN XL) 5 MG 24 hr tablet Take 1 tablet (5 mg total) by mouth 2  (two) times daily as needed (bladder spasm). Patient not taking: Reported on 09/30/2021 06/20/21   Ralene Muskrat B, MD  polyethylene glycol (MIRALAX / GLYCOLAX) 17 g packet Take 17 g by mouth 2 (two) times daily. 11/16/20   Shelly Coss, MD  prazosin (MINIPRESS) 1 MG capsule Take 1 mg by mouth at bedtime. 06/03/21   [provider]  senna-docusate (SENOKOT-S) 8.6-50 MG tablet Take 1 tablet by mouth 2 (two) times daily. 11/08/20   Sidney Ace, MD  tamsulosin (FLOMAX) 0.4 MG CAPS capsule Take 1 capsule (0.4 mg total) by mouth daily. Patient not taking: Reported on 09/30/2021 11/16/20   Shelly Coss, MD  traMADol (ULTRAM) 50 MG tablet Take 50 mg by mouth every 6 (six) hours as needed. Patient not taking: Reported on 09/30/2021 07/22/21   [provider]  venlafaxine XR (EFFEXOR-XR) 75 MG 24  hr capsule Take 75 mg by mouth daily. 06/20/21   [provider]     Allergies  Iodine, Amlodipine, Nifedipine, Iodinated contrast media, Lisinopril, Quinapril, Shellfish allergy, and Simvastatin   Family History  No family history on file.   Physical Exam  Triage Vital Signs: ED Triage Vitals  Enc Vitals Group     BP 10/08/21 2207 (!) 154/50     Pulse Rate 10/08/21 2208 62     Resp 10/08/21 2233 20     Temp 10/08/21 2218 98.8 F (37.1 C)     Temp Source 10/08/21 2218 Oral     SpO2 10/08/21 2208 96 %     Weight 10/08/21 2207 217 lb 12.8 oz (98.8 kg)     Height --      Head Circumference --      [facility] --      Pain Score --      Pain Loc --      Pain Edu? --      Excl. in [place]? --     Updated Vital Signs: BP (!) 153/57   Pulse 60   Temp 98.8 F (37.1 C) (Oral)   Resp 20   Wt 98.8 kg   SpO2 98%   BMI 34.11 kg/m    General: Awake, no distress.  Minimally verbal but able to grunt yes or no.  Cooperative with exam. CV:  RRR.  Good peripheral perfusion.  Resp:  Normal effort.  CTAB. Abd:  Nontender.  No distention.  Other:  Minimally verbal.   Responds to basic commands.  Left lower leg swollen and warm to the touch.  No erythema.  Tender to palpation.  2+ distal pulses.  Brisk, less than 5-second capillary refill.   ED Results / Procedures / Treatments  Labs (all labs ordered are listed, but only abnormal results are displayed) Labs Reviewed  CBC WITH DIFFERENTIAL/PLATELET - Abnormal; Notable for the following components:      Result Value   Hemoglobin 9.8 (*)    HCT 32.4 (*)    MCV 76.2 (*)    MCH 23.1 (*)    RDW 16.1 (*)    Monocytes Absolute 1.1 (*)    All other components within normal limits  COMPREHENSIVE METABOLIC PANEL - Abnormal; Notable for the following components:   Sodium 124 (*)    Chloride 88 (*)    Glucose, Bld 100 (*)    Creatinine, Ser 1.24 (*)    Albumin 3.4 (*)    GFR, Estimated 43 (*)    All other components within normal limits  CULTURE, BLOOD (ROUTINE X 2)  CULTURE, BLOOD (ROUTINE X 2)  SARS CORONAVIRUS 2 BY RT PCR  URINE CULTURE  URINALYSIS, ROUTINE W REFLEX MICROSCOPIC  LACTIC ACID, PLASMA  LACTIC ACID, PLASMA  PROCALCITONIN  TROPONIN I (HIGH SENSITIVITY)     EKG  ED ECG REPORT I, Salif Tay J, the attending physician, personally viewed and interpreted this ECG.   Date: 10/08/2021  EKG Time: 2213  Rate: 60  Rhythm: Junctional  Axis: LAD  Intervals:nonspecific intraventricular conduction delay  ST&T Change: Nonspecific    RADIOLOGY I have independently visualized and interpreted patient's chest and ankle x-rays, CT head as well as noted the radiology interpretation:  Chest x-ray: Mild vascular congestion  Left ankle x-rays: Distal fibula fracture  CT head:  Official radiology report(s): DG Chest Port 1 View  Result Date: 10/08/2021 CLINICAL DATA:  Altered mental status EXAM: PORTABLE CHEST 1 VIEW COMPARISON:  07/01/2021 FINDINGS: Cardiac shadow is enlarged but stable. Aortic calcifications are again seen. Implantable pacemaker is noted and stable. Mild increased central  vascular congestion is noted. No sizable effusion or focal infiltrate is noted. IMPRESSION: Mild vascular congestion. Electronically Signed   By: Inez Catalina M.D.   On: 10/08/2021 23:30   DG Ankle Complete Left  Result Date: 10/08/2021 CLINICAL DATA:  Pain and swelling in the ankle, initial encounter EXAM: LEFT ANKLE COMPLETE - 3+ VIEW COMPARISON:  None Available. FINDINGS: Oblique fracture is noted in the distal diaphysis of the fibula. No definitive tibial fracture is seen. Mild soft tissue swelling is noted. Widening of the tibiotalar space is noted on the lateral projection. No other fractures are seen. Flattening of the plantar arch is noted. IMPRESSION: Oblique fracture through the distal fibular diaphysis is noted. Some widening of the tibiotalar space is noted on the lateral projection. Flattening of the plantar arch is seen. Electronically Signed   By: Inez Catalina M.D.   On: 10/08/2021 23:17     PROCEDURES:  Critical Care performed: Yes, see critical care procedure note(s)  CRITICAL CARE Performed by: Paulette Blanch   Total critical care time: *** minutes  Critical care time was exclusive of separately billable procedures and treating other patients.  Critical care was necessary to treat or prevent imminent or life-threatening deterioration.  Critical care was time spent personally by me on the following activities: development of treatment plan with patient and/or surrogate as well as nursing, discussions with consultants, evaluation of patient's response to treatment, examination of patient, obtaining history from patient or surrogate, ordering and performing treatments and interventions, ordering and review of laboratory studies, ordering and review of radiographic studies, pulse oximetry and re-evaluation of patient's condition.   Marland Kitchen1-3 Lead EKG Interpretation  Performed by: Paulette Blanch, MD Authorized by: Paulette Blanch, MD     Interpretation: normal     ECG rate:  60   ECG  rate assessment: normal     Rhythm: sinus rhythm     Ectopy: none     Conduction: normal   Comments:     Patient placed on cardiac monitor to evaluate for arrhythmias-year-old female laboratory results    [place] ED: Medications - No data to display   IMPRESSION / MDM / [place] / ED COURSE  I reviewed the triage vital signs and the nursing notes.                             86 year old female brought for altered mental status. Differential diagnosis includes, but is not limited to, alcohol, illicit or prescription medications, or other toxic ingestion; intracranial pathology such as stroke or intracerebral hemorrhage; fever or infectious causes including sepsis; hypoxemia and/or hypercarbia; uremia; trauma; endocrine related disorders such as diabetes, hypoglycemia, and thyroid-related diseases; hypertensive encephalopathy; etc. I have personally reviewed patient's records and note her routine PCP office visit from 09/21/2021 for post-menopause, hypertension and hyperlipidemia  Patient's presentation is most consistent with acute presentation with potential threat to life or bodily function.  The patient is on the cardiac monitor to evaluate for evidence of arrhythmia and/or significant heart rate changes.  Laboratory results demonstrate normal WBC 8.1, hyponatremia Na+ 124, mild AKI 1.24. Chest xray unremarkable. Left fibula fracture noted. Will add sepsis markers, Covid swab, CT Head. Initiate IV fluids for hyponatremia. Anticipate hospitalization.      FINAL CLINICAL IMPRESSION(S) /  ED DIAGNOSES   Final diagnoses:  Altered mental status, unspecified altered mental status type  Hyponatremia  Closed fracture of distal end of left fibula, unspecified fracture morphology, initial encounter     Rx / DC Orders   ED Discharge Orders     None        Note:  This document was prepared using Dragon voice recognition software and may include  unintentional dictation errors.

## 2021-10-08 NOTE — ED Triage Notes (Addendum)
Patient brought in by EMS from [facility]; EMS states that staff told them that she was altered but did not explain what her baseline function is; Patient brought in by EMS from [facility]; EMS states that staff told them that she was altered but did not explain what her baseline function is; EMS also reports multiple falls but does now know when or how many falls have occurred; Patient's LEFT ankle and calf are tender and hot to touch; Patient is nonverbal and grunts but is able to answer yes and no questions; SNF reported to EMS that her sodium was low but could not tell them what the value was nor could they look it up because they're computers were not working

## 2021-10-09 ENCOUNTER — Encounter: Payer: Self-pay | Admitting: Internal Medicine

## 2021-10-09 ENCOUNTER — Inpatient Hospital Stay: Payer: Medicare Other

## 2021-10-09 ENCOUNTER — Other Ambulatory Visit: Payer: Self-pay

## 2021-10-09 DIAGNOSIS — I2510 Atherosclerotic heart disease of native coronary artery without angina pectoris: Secondary | ICD-10-CM | POA: Diagnosis present

## 2021-10-09 DIAGNOSIS — N1832 Chronic kidney disease, stage 3b: Secondary | ICD-10-CM | POA: Diagnosis present

## 2021-10-09 DIAGNOSIS — Z96653 Presence of artificial knee joint, bilateral: Secondary | ICD-10-CM | POA: Diagnosis present

## 2021-10-09 DIAGNOSIS — Z91041 Radiographic dye allergy status: Secondary | ICD-10-CM | POA: Diagnosis not present

## 2021-10-09 DIAGNOSIS — F0390 Unspecified dementia without behavioral disturbance: Secondary | ICD-10-CM | POA: Diagnosis not present

## 2021-10-09 DIAGNOSIS — S82832A Other fracture of upper and lower end of left fibula, initial encounter for closed fracture: Secondary | ICD-10-CM | POA: Diagnosis present

## 2021-10-09 DIAGNOSIS — F319 Bipolar disorder, unspecified: Secondary | ICD-10-CM | POA: Diagnosis present

## 2021-10-09 DIAGNOSIS — Z20822 Contact with and (suspected) exposure to covid-19: Secondary | ICD-10-CM | POA: Diagnosis present

## 2021-10-09 DIAGNOSIS — I482 Chronic atrial fibrillation, unspecified: Secondary | ICD-10-CM

## 2021-10-09 DIAGNOSIS — Z91013 Allergy to seafood: Secondary | ICD-10-CM | POA: Diagnosis not present

## 2021-10-09 DIAGNOSIS — E871 Hypo-osmolality and hyponatremia: Secondary | ICD-10-CM

## 2021-10-09 DIAGNOSIS — I10 Essential (primary) hypertension: Secondary | ICD-10-CM

## 2021-10-09 DIAGNOSIS — I129 Hypertensive chronic kidney disease with stage 1 through stage 4 chronic kidney disease, or unspecified chronic kidney disease: Secondary | ICD-10-CM | POA: Diagnosis present

## 2021-10-09 DIAGNOSIS — R4182 Altered mental status, unspecified: Secondary | ICD-10-CM | POA: Diagnosis present

## 2021-10-09 DIAGNOSIS — Z888 Allergy status to other drugs, medicaments and biological substances status: Secondary | ICD-10-CM | POA: Diagnosis not present

## 2021-10-09 DIAGNOSIS — Y92129 Unspecified place in nursing home as the place of occurrence of the external cause: Secondary | ICD-10-CM | POA: Diagnosis not present

## 2021-10-09 DIAGNOSIS — Z950 Presence of cardiac pacemaker: Secondary | ICD-10-CM | POA: Diagnosis not present

## 2021-10-09 DIAGNOSIS — Z79899 Other long term (current) drug therapy: Secondary | ICD-10-CM | POA: Diagnosis not present

## 2021-10-09 DIAGNOSIS — M48061 Spinal stenosis, lumbar region without neurogenic claudication: Secondary | ICD-10-CM | POA: Diagnosis present

## 2021-10-09 DIAGNOSIS — N179 Acute kidney failure, unspecified: Secondary | ICD-10-CM | POA: Diagnosis present

## 2021-10-09 DIAGNOSIS — F0153 Vascular dementia, unspecified severity, with mood disturbance: Secondary | ICD-10-CM | POA: Diagnosis present

## 2021-10-09 DIAGNOSIS — R296 Repeated falls: Secondary | ICD-10-CM | POA: Diagnosis present

## 2021-10-09 DIAGNOSIS — Z8659 Personal history of other mental and behavioral disorders: Secondary | ICD-10-CM

## 2021-10-09 DIAGNOSIS — W19XXXA Unspecified fall, initial encounter: Secondary | ICD-10-CM | POA: Diagnosis present

## 2021-10-09 DIAGNOSIS — Z993 Dependence on wheelchair: Secondary | ICD-10-CM | POA: Diagnosis not present

## 2021-10-09 DIAGNOSIS — Z7901 Long term (current) use of anticoagulants: Secondary | ICD-10-CM | POA: Diagnosis not present

## 2021-10-09 DIAGNOSIS — Z66 Do not resuscitate: Secondary | ICD-10-CM | POA: Diagnosis present

## 2021-10-09 DIAGNOSIS — E785 Hyperlipidemia, unspecified: Secondary | ICD-10-CM | POA: Diagnosis present

## 2021-10-09 LAB — TSH: TSH: 1.197 u[IU]/mL (ref 0.350–4.500)

## 2021-10-09 LAB — CBC
HCT: 31.2 % — ABNORMAL LOW (ref 36.0–46.0)
Hemoglobin: 9.5 g/dL — ABNORMAL LOW (ref 12.0–15.0)
MCH: 23 pg — ABNORMAL LOW (ref 26.0–34.0)
MCHC: 30.4 g/dL (ref 30.0–36.0)
MCV: 75.5 fL — ABNORMAL LOW (ref 80.0–100.0)
Platelets: 255 10*3/uL (ref 150–400)
RBC: 4.13 MIL/uL (ref 3.87–5.11)
RDW: 16 % — ABNORMAL HIGH (ref 11.5–15.5)
WBC: 7.9 10*3/uL (ref 4.0–10.5)
nRBC: 0 % (ref 0.0–0.2)

## 2021-10-09 LAB — BASIC METABOLIC PANEL
Anion gap: 8 (ref 5–15)
Anion gap: 8 (ref 5–15)
Anion gap: 9 (ref 5–15)
Anion gap: 6 (ref 5–15)
BUN: 12 mg/dL (ref 8–23)
BUN: 13 mg/dL (ref 8–23)
BUN: 12 mg/dL (ref 8–23)
BUN: 12 mg/dL (ref 8–23)
CO2: 27 mmol/L (ref 22–32)
CO2: 28 mmol/L (ref 22–32)
CO2: 26 mmol/L (ref 22–32)
CO2: 29 mmol/L (ref 22–32)
Calcium: 9.2 mg/dL (ref 8.9–10.3)
Calcium: 9.4 mg/dL (ref 8.9–10.3)
Calcium: 9.2 mg/dL (ref 8.9–10.3)
Calcium: 9.1 mg/dL (ref 8.9–10.3)
Chloride: 88 mmol/L — ABNORMAL LOW (ref 98–111)
Chloride: 90 mmol/L — ABNORMAL LOW (ref 98–111)
Chloride: 93 mmol/L — ABNORMAL LOW (ref 98–111)
Chloride: 95 mmol/L — ABNORMAL LOW (ref 98–111)
Creatinine, Ser: 1.23 mg/dL — ABNORMAL HIGH (ref 0.44–1.00)
Creatinine, Ser: 1.21 mg/dL — ABNORMAL HIGH (ref 0.44–1.00)
Creatinine, Ser: 1.2 mg/dL — ABNORMAL HIGH (ref 0.44–1.00)
Creatinine, Ser: 1.22 mg/dL — ABNORMAL HIGH (ref 0.44–1.00)
GFR, Estimated: 43 mL/min — ABNORMAL LOW (ref >60)
GFR, Estimated: 44 mL/min — ABNORMAL LOW (ref >60)
GFR, Estimated: 45 mL/min — ABNORMAL LOW (ref >60)
GFR, Estimated: 44 mL/min — ABNORMAL LOW (ref >60)
Glucose, Bld: 96 mg/dL (ref 70–99)
Glucose, Bld: 101 mg/dL — ABNORMAL HIGH (ref 70–99)
Glucose, Bld: 95 mg/dL (ref 70–99)
Glucose, Bld: 99 mg/dL (ref 70–99)
Potassium: 4.8 mmol/L (ref 3.5–5.1)
Potassium: 4.5 mmol/L (ref 3.5–5.1)
Potassium: 4.3 mmol/L (ref 3.5–5.1)
Potassium: 4.7 mmol/L (ref 3.5–5.1)
Sodium: 123 mmol/L — ABNORMAL LOW (ref 135–145)
Sodium: 126 mmol/L — ABNORMAL LOW (ref 135–145)
Sodium: 128 mmol/L — ABNORMAL LOW (ref 135–145)
Sodium: 130 mmol/L — ABNORMAL LOW (ref 135–145)

## 2021-10-09 LAB — OSMOLALITY, URINE: Osmolality, Ur: 83 mOsm/kg — ABNORMAL LOW (ref 300–900)

## 2021-10-09 LAB — URINALYSIS, ROUTINE W REFLEX MICROSCOPIC
Bilirubin Urine: NEGATIVE
Glucose, UA: NEGATIVE mg/dL
Hgb urine dipstick: NEGATIVE
Ketones, ur: NEGATIVE mg/dL
Leukocytes,Ua: NEGATIVE
Nitrite: NEGATIVE
Protein, ur: NEGATIVE mg/dL
Specific Gravity, Urine: 1.002 — ABNORMAL LOW (ref 1.005–1.030)
pH: 7 (ref 5.0–8.0)

## 2021-10-09 LAB — SARS CORONAVIRUS 2 BY RT PCR: SARS Coronavirus 2 by RT PCR: NEGATIVE

## 2021-10-09 LAB — LACTIC ACID, PLASMA: Lactic Acid, Venous: 1.6 mmol/L (ref 0.5–1.9)

## 2021-10-09 LAB — PROCALCITONIN: Procalcitonin: <0.1 ng/mL

## 2021-10-09 LAB — OSMOLALITY: Osmolality: 266 mOsm/kg — ABNORMAL LOW (ref 275–295)

## 2021-10-09 LAB — SODIUM, URINE, RANDOM: Sodium, Ur: 12 mmol/L

## 2021-10-09 LAB — MAGNESIUM: Magnesium: 2.2 mg/dL (ref 1.7–2.4)

## 2021-10-09 MED ORDER — ACETAMINOPHEN 650 MG RE SUPP: 650.0000 mg | Freq: Four times a day (QID) | RECTAL | Status: DC | PRN

## 2021-10-09 MED ORDER — LOSARTAN POTASSIUM 50 MG PO TABS
100.0000 mg | ORAL_TABLET | Freq: Every day | ORAL | Status: DC
  Administered 2021-10-09 – 2021-10-10 (×2): 100 mg via ORAL
  Filled 2021-10-09 (×2): qty 2

## 2021-10-09 MED ORDER — OLANZAPINE 5 MG PO TABS
2.5000 mg | ORAL_TABLET | Freq: Every day | ORAL | Status: DC
  Administered 2021-10-09: 2.5 mg via ORAL
  Filled 2021-10-09: qty 1

## 2021-10-09 MED ORDER — APIXABAN 2.5 MG PO TABS
2.5000 mg | ORAL_TABLET | Freq: Two times a day (BID) | ORAL | Status: DC
  Administered 2021-10-09 – 2021-10-10 (×3): 2.5 mg via ORAL
  Filled 2021-10-09 (×3): qty 1

## 2021-10-09 MED ORDER — HALOPERIDOL LACTATE 5 MG/ML IJ SOLN
5.0000 mg | Freq: Once | INTRAMUSCULAR | Status: AC
  Administered 2021-10-09: 5 mg via INTRAVENOUS
  Filled 2021-10-09: qty 1

## 2021-10-09 MED ORDER — SODIUM CHLORIDE 0.9 % IV SOLN: INTRAVENOUS | Status: DC

## 2021-10-09 MED ORDER — ACETAMINOPHEN 325 MG PO TABS
650.0000 mg | ORAL_TABLET | Freq: Four times a day (QID) | ORAL | Status: DC | PRN
  Administered 2021-10-09: 650 mg via ORAL
  Filled 2021-10-09: qty 2

## 2021-10-09 MED ORDER — HYDRALAZINE HCL 25 MG PO TABS
25.0000 mg | ORAL_TABLET | Freq: Three times a day (TID) | ORAL | Status: DC
  Administered 2021-10-09 – 2021-10-10 (×4): 25 mg via ORAL
  Filled 2021-10-09 (×4): qty 1

## 2021-10-09 MED ORDER — POLYETHYLENE GLYCOL 3350 17 G PO PACK: 17.0000 g | PACK | Freq: Every day | ORAL | Status: DC | PRN

## 2021-10-09 MED ORDER — PANTOPRAZOLE SODIUM 40 MG PO TBEC
40.0000 mg | DELAYED_RELEASE_TABLET | Freq: Every day | ORAL | Status: DC
  Administered 2021-10-09 – 2021-10-10 (×2): 40 mg via ORAL
  Filled 2021-10-09 (×2): qty 1

## 2021-10-09 MED ORDER — VENLAFAXINE HCL ER 75 MG PO CP24
75.0000 mg | ORAL_CAPSULE | Freq: Every day | ORAL | Status: DC
  Administered 2021-10-09 – 2021-10-10 (×2): 75 mg via ORAL
  Filled 2021-10-09 (×2): qty 1

## 2021-10-09 MED ORDER — CARVEDILOL 25 MG PO TABS
25.0000 mg | ORAL_TABLET | Freq: Two times a day (BID) | ORAL | Status: DC
  Administered 2021-10-09 – 2021-10-10 (×3): 25 mg via ORAL
  Filled 2021-10-09 (×3): qty 1

## 2021-10-09 MED ORDER — DIVALPROEX SODIUM 125 MG PO CSDR
125.0000 mg | DELAYED_RELEASE_CAPSULE | Freq: Two times a day (BID) | ORAL | Status: DC
  Administered 2021-10-09 – 2021-10-10 (×3): 125 mg via ORAL
  Filled 2021-10-09 (×3): qty 1

## 2021-10-09 MED ORDER — SODIUM CHLORIDE 0.9% FLUSH
3.0000 mL | Freq: Two times a day (BID) | INTRAVENOUS | Status: DC
  Administered 2021-10-09 – 2021-10-10 (×3): 3 mL via INTRAVENOUS

## 2021-10-09 NOTE — Progress Notes (Signed)
Ortho Tech called by unit clerk, MD notified of unavailability of brace.

## 2021-10-09 NOTE — H&P (Signed)
History and Physical   Katrina Chen [id number] DOB: January 17, 1936 DOA: 10/08/2021  PCP: Cephas Darby, FNP   Patient coming from: [facility]  Chief Complaint: Altered mental status, falls  HPI: Katrina Chen is a 86 y.o. adult with medical history significant of CAD, atrial fibrillation, hypertension, CKD 3B, bipolar, dementia presenting from facility with altered mental status.  History obtained with assistance of chart review due to patient's altered mental status/dementia.  Patient reportedly was sent from facility with altered mental status.  EMS was unable to find out what her baseline was but was simply told she was altered.  Was also informed that she had some low sodium and had some reported falls.  Patient in the ED has been largely nonverbal other than answering yes/no questions and at times subsequently yelling out nonspecifically.  EMS noted patient to have tender and warm left ankle.  Unable obtain review of systems due to patient's altered mentation.  ED Course: Vital signs in the ED significant for blood pressure in the [id number] to [id number] systolic.  Lab work-up included CMP with sodium 124, chloride 88, creatinine 1.24 which is near baseline, glucose 100, albumin 3.4.  CBC with hemoglobin stable at 9.8.  Procalcitonin pending.  Troponin negative.  Lactic acid negative.  COVID screening pending.  Urinalysis without evidence of infection.  Urine culture and blood culture pending.  Left ankle x-ray showed fracture at the fibular diaphysis.  Chest x-ray showed mild vascular congestion.  CT head showed no acute normality.  Patient received a dose of Haldol as she has been yelling out and very active and there was concern that she would be at risk for further injuring her left ankle which was put in a splint.  Review of Systems: Unable to obtain review of systems due to patient's altered mentation.  Past Medical History:  Diagnosis Date   A-fib [facility])    Anxiety    Chronic kidney disease     Depression    Hyperlipidemia    Hypertension    Iron deficiency    Vascular dementia [facility])     Past Surgical History:  Procedure Laterality Date   ESOPHAGOGASTRODUODENOSCOPY (EGD) WITH PROPOFOL N/A 07/01/2021   Procedure: ESOPHAGOGASTRODUODENOSCOPY (EGD) WITH PROPOFOL;  Surgeon: Jonathon Bellows, MD;  Location: [facility] ENDOSCOPY;  Service: Gastroenterology;  Laterality: N/A;   ESOPHAGOGASTRODUODENOSCOPY (EGD) WITH PROPOFOL N/A 07/27/2021   Procedure: ESOPHAGOGASTRODUODENOSCOPY (EGD) WITH PROPOFOL;  Surgeon: Lin Landsman, MD;  Location: [facility] ENDOSCOPY;  Service: Gastroenterology;  Laterality: N/A;   PACEMAKER IMPLANT      Social History  reports that she has never smoked. She has never used smokeless tobacco. She reports that she does not drink alcohol and does not use drugs.  Allergies  Allergen Reactions   Iodine Swelling   Amlodipine Other (See Comments)   Nifedipine Swelling and Other (See Comments)   Iodinated Contrast Media    Lisinopril Swelling   Quinapril Swelling   Shellfish Allergy    Simvastatin Swelling    No family history on file.  Prior to Admission medications   Medication Sig Start Date End Date Taking? Authorizing Provider  bisacodyl (DULCOLAX) 10 MG suppository Place 1 suppository (10 mg total) rectally daily as needed for mild constipation. 11/08/20   Sidney Ace, MD  carvedilol (COREG) 25 MG tablet Take 25 mg by mouth 2 (two) times daily. 05/24/21   [provider]  cloNIDine (CATAPRES - DOSED IN MG/24 HR) 0.2 mg/24hr patch Place 0.2 mg onto the skin once  a week. 06/02/21   [provider]  cycloSPORINE (RESTASIS) 0.05 % ophthalmic emulsion Place 1 drop into both eyes 2 (two) times daily. 05/26/21   [provider]  divalproex (DEPAKOTE SPRINKLE) 125 MG capsule Take 125 mg by mouth 2 (two) times daily. 02/18/21   [provider]  ELIQUIS 2.5 MG TABS tablet Take 2.5 mg by mouth 2 (two) times daily. 11/03/20   [provider]  FLONASE ALLERGY RELIEF 50 MCG/ACT nasal spray Place 1 spray into both nostrils daily. 05/31/21   [provider]  furosemide (LASIX) 20 MG tablet Take 20 mg by mouth daily. 09/30/21   [provider]  gabapentin (NEURONTIN) 100 MG capsule Take 100 mg by mouth every 8 (eight) hours. 11/03/20   [provider]  hydrALAZINE (APRESOLINE) 25 MG tablet Take 25 mg by mouth 3 (three) times daily. 06/24/21   [provider]  levocetirizine (XYZAL) 5 MG tablet Take 5 mg by mouth daily. 06/11/21   [provider]  lidocaine (LIDODERM) 5 % Place 1 patch onto the skin daily. Remove & Discard patch within 12 hours or as directed by MD Patient not taking: Reported on 09/30/2021 06/20/21   Sidney Ace, MD  losartan (COZAAR) 100 MG tablet Take 100 mg by mouth daily. 10/18/20   [provider]  melatonin 5 MG TABS Take 10 mg by mouth at bedtime.    [provider]  methocarbamol (ROBAXIN) 500 MG tablet Take 1 tablet (500 mg total) by mouth every 8 (eight) hours as needed for muscle spasms. 06/20/21   Sreenath, Sudheer B, MD  OLANZapine (ZYPREXA) 2.5 MG tablet Take 2.5 mg by mouth at bedtime. 10/28/20   [provider]  olopatadine (PATANOL) 0.1 % ophthalmic solution Place 1 drop into both eyes 2 (two) times daily.    [provider]  omeprazole (PRILOSEC) 20 MG capsule Take 1 capsule (20 mg total) by mouth 2 (two) times daily before a meal for 14 days. 07/29/21 09/30/21  Lin Landsman, MD  ondansetron (ZOFRAN-ODT) 4 MG disintegrating tablet Take by mouth. 07/14/21   [provider]  oxybutynin (DITROPAN XL) 5 MG 24 hr tablet Take 1 tablet (5 mg total) by mouth 2 (two) times daily as needed (bladder spasm). Patient not taking: Reported on 09/30/2021 06/20/21   Ralene Muskrat B, MD  polyethylene glycol (MIRALAX / GLYCOLAX) 17 g packet Take 17 g by mouth 2 (two) times daily. 11/16/20   Shelly Coss, MD  prazosin  (MINIPRESS) 1 MG capsule Take 1 mg by mouth at bedtime. 06/03/21   [provider]  senna-docusate (SENOKOT-S) 8.6-50 MG tablet Take 1 tablet by mouth 2 (two) times daily. 11/08/20   Sidney Ace, MD  tamsulosin (FLOMAX) 0.4 MG CAPS capsule Take 1 capsule (0.4 mg total) by mouth daily. Patient not taking: Reported on 09/30/2021 11/16/20   Shelly Coss, MD  venlafaxine XR (EFFEXOR-XR) 75 MG 24 hr capsule Take 75 mg by mouth daily. 06/20/21   [provider]    Physical Exam: Vitals:   10/08/21 2218 10/08/21 2233 10/08/21 2300 10/08/21 2330  BP:  (!) 153/57 (!) 155/57 (!) 164/53  Pulse:  60 60 60  Resp:  20 (!) 21 13  Temp: 98.8 F (37.1 C)     TempSrc: Oral     SpO2:  98% 98% 100%  Weight:        Physical Exam Constitutional:      General: She is not in  acute distress.    Comments: Altered elderly female.  HENT:     Head: Normocephalic and atraumatic.     Mouth/Throat:     Mouth: Mucous membranes are moist.     Pharynx: Oropharynx is clear.  Eyes:     Extraocular Movements: Extraocular movements intact.     Pupils: Pupils are equal, round, and reactive to light.  Cardiovascular:     Rate and Rhythm: Normal rate and regular rhythm.     Pulses: Normal pulses.     Heart sounds: Normal heart sounds.  Pulmonary:     Effort: Pulmonary effort is normal. No respiratory distress.     Breath sounds: Normal breath sounds.  Abdominal:     General: Bowel sounds are normal. There is no distension.     Palpations: Abdomen is soft.     Tenderness: There is no abdominal tenderness.  Musculoskeletal:        General: No swelling or deformity.     Comments: Swollen left ankle.  Skin:    General: Skin is warm and dry.  Neurological:     General: No focal deficit present.     Mental Status: She is disoriented.     Comments: Altered elderly female with unclear baseline.    Labs on Admission: I have personally reviewed following labs and imaging  studies  CBC: Recent Labs  Lab 10/08/21 2216  WBC 8.1  NEUTROABS 5.8  HGB 9.8*  HCT 32.4*  MCV 76.2*  PLT 285    Basic Metabolic Panel: Recent Labs  Lab 10/08/21 2216  NA 124*  K 4.7  CL 88*  CO2 28  GLUCOSE 100*  BUN 12  CREATININE 1.24*  CALCIUM 9.2    GFR: Estimated Creatinine Clearance (by C-G formula based on SCr of 1.24 mg/dL (H)) Female: 73.7 mL/min (A) Female: 49.7 mL/min (A)  Liver Function Tests: Recent Labs  Lab 10/08/21 2216  AST 21  ALT 11  ALKPHOS 81  BILITOT 0.6  PROT 6.9  ALBUMIN 3.4*    Urine analysis:    Component Value Date/Time   COLORURINE STRAW (A) 10/08/2021 2216   APPEARANCEUR CLEAR (A) 10/08/2021 2216   LABSPEC 1.002 (L) 10/08/2021 2216   PHURINE 7.0 10/08/2021 2216   GLUCOSEU NEGATIVE 10/08/2021 2216   HGBUR NEGATIVE 10/08/2021 2216   BILIRUBINUR NEGATIVE 10/08/2021 2216   KETONESUR NEGATIVE 10/08/2021 2216   PROTEINUR NEGATIVE 10/08/2021 2216   NITRITE NEGATIVE 10/08/2021 2216   LEUKOCYTESUR NEGATIVE 10/08/2021 2216    Radiological Exams on Admission: CT Head Wo Contrast  Result Date: 10/09/2021 CLINICAL DATA:  Altered mental status. EXAM: CT HEAD WITHOUT CONTRAST TECHNIQUE: Contiguous axial images were obtained from the base of the skull through the vertex without intravenous contrast. RADIATION DOSE REDUCTION: This exam was performed according to the departmental dose-optimization program which includes automated exposure control, adjustment of the mA and/or kV according to patient size and/or use of iterative reconstruction technique. COMPARISON:  Head CT dated 11/05/2020. FINDINGS: Brain: Mild age-related atrophy and chronic microvascular ischemic changes. There is no acute intracranial hemorrhage. No mass effect or midline shift. No extra-axial fluid collection. Vascular: No hyperdense vessel or unexpected calcification. Skull: Normal. Negative for fracture or focal lesion. Sinuses/Orbits: No acute finding. Other: None  IMPRESSION: 1. No acute intracranial pathology. 2. Mild age-related atrophy and chronic microvascular ischemic changes. Electronically Signed   By: Elgie Collard M.D.   On: 10/09/2021 00:18   DG Chest Port 1 View  Result Date: 10/08/2021 CLINICAL DATA:  Altered mental status EXAM: PORTABLE CHEST 1 VIEW COMPARISON:  07/01/2021 FINDINGS: Cardiac shadow is enlarged but stable. Aortic calcifications are again seen. Implantable pacemaker is noted and stable. Mild increased central vascular congestion is noted. No sizable effusion or focal infiltrate is noted. IMPRESSION: Mild vascular congestion. Electronically Signed   By: Inez Catalina M.D.   On: 10/08/2021 23:30   DG Ankle Complete Left  Result Date: 10/08/2021 CLINICAL DATA:  Pain and swelling in the ankle, initial encounter EXAM: LEFT ANKLE COMPLETE - 3+ VIEW COMPARISON:  None Available. FINDINGS: Oblique fracture is noted in the distal diaphysis of the fibula. No definitive tibial fracture is seen. Mild soft tissue swelling is noted. Widening of the tibiotalar space is noted on the lateral projection. No other fractures are seen. Flattening of the plantar arch is noted. IMPRESSION: Oblique fracture through the distal fibular diaphysis is noted. Some widening of the tibiotalar space is noted on the lateral projection. Flattening of the plantar arch is seen. Electronically Signed   By: Inez Catalina M.D.   On: 10/08/2021 23:17    EKG: Independently reviewed.  Junctional rhythm.  Evidence of right bundle branch block versus paced rhythm.  Intraventricular conduction delay at 166 ms.  QTc 480.  Similar to previous.  Assessment/Plan Principal Problem:   Hyponatremia Active Problems:   CAD (coronary artery disease)   History of bipolar disorder   Atrial fibrillation, chronic (HCC)   CKD (chronic kidney disease), stage III (HCC)   Essential hypertension   Dementia (HCC)   Hyponatremia > Patient noted to have low sodium facility and has had this  intermittently in the past with chart review showing sodium around 130 frequently.  Currently at 124. > Unclear etiology though with the chronicity may be related to her antipsychotics. - Monitor on telemetry - Initiate sodium at 100 cc an hour - Every 4 hour BMPs - Serum osmolality, urine osmolality, urine sodium - Rechecking TSH   Left ankle fracture > Patient reportedly had some falls at facility although is unclear how mobile she has. > Noted to have tender warm left ankle and imaging in the ED confirmed fracture at the fibular diaphysis. -Splint ordered in the ED - May benefit from orthopedics consult while admitted depending on her mobility  Altered mental status Dementia Bipolar disorder > Patient presenting with reported altered mental status in the setting of dementia and bipolar disorder. > Unclear baseline this was not communicated to EMS nor the staff in the ED. > Attempted to contact daughter but there was no answer. > Etiology could be acute fracture of ankle versus delirium in the setting of moderate hyponatremia. > No infectious etiology identified urinalysis looks okay, chest x-ray without pneumonia, CT head normal, no leukocytosis.  Procalcitonin pending.  Normal lactic acid.  Troponin pending. > Did receive a dose of Haldol in the ED due to concern that patient may further harm her ankle due to her level of activity. - Monitor on telemetry - Monitor response to treatment as above - Check TSH, VBG, magnesium for completeness - Safety sitter - Continue home olanzapine, venlafaxine, Depakote  CAD - Continue home carvedilol, losartan  Atrial fibrillation - Continue home carvedilol, Eliquis  CKD 3B > Creatinine stable at 1.24 - Holding home Lasix - Trend renal function and electrolytes  Hypertension - Continue home hydralazine, losartan, carvedilol - Is also on weekly clonidine patch - Holding home Lasix as above  DVT prophylaxis: Eliquis Code Status:    DNR Family Communication:  Attempted to contact daughter by phone, but there was no answer. Disposition Plan:   Patient is from:  [facility]  Anticipated DC to:  Same as above  Anticipated DC date:  1 to 3 days  Anticipated DC barriers: None  Consults called:  None Admission status:  Observation, telemetry  Severity of Illness: The appropriate patient status for this patient is OBSERVATION. Observation status is judged to be reasonable and necessary in order to provide the required intensity of service to ensure the patient's safety. The patient's presenting symptoms, physical exam findings, and initial radiographic and laboratory data in the context of their medical condition is felt to place them at decreased risk for further clinical deterioration. Furthermore, it is anticipated that the patient will be medically stable for discharge from the hospital within 2 midnights of admission.    Synetta Fail MD [facility]  How to contact the [facility] Attending or Consulting provider 7A - 7P or covering provider during after hours 7P -7A, for this patient?   Check the care team in [facility] and look for a) attending/consulting [facility] provider listed and b) the [facility] team listed Log into [email or web address] and use [place]'s universal password to access. If you do not have the password, please contact the hospital operator. Locate the [facility] provider you are looking for under [facility] and page to a number that you can be directly reached. If you still have difficulty reaching the provider, please page the [facility] (Director on Call) for the Hospitalists listed on amion for assistance.  10/09/2021, 1:01 AM

## 2021-10-09 NOTE — Progress Notes (Signed)
PT Cancellation Note  Patient Details Name: Katrina Chen MRN: [id number] DOB: 10-22-1935   Cancelled Treatment:    Reason Eval/Treat Not Completed: Patient not medically ready.  PT consult received.  Chart reviewed.  Pt currently resting in bed (no splint or cast or anything noted on pt's L ankle to address L distal fibular fx (nurse aware and reports plan for Dr. Gigi Gin- to see pt).  Per MD note, "PT orders after seen by ortho".  Will hold PT at this time until pt seen by ortho and appropriate for OOB mobility.  Hendricks Limes, PT 10/09/21, 4:24 PM

## 2021-10-09 NOTE — Progress Notes (Signed)
Patient admitted early hours of the morning. No family at bedside. Currently sleeping. Has baseline dementia does not give any history review of systems upon trying to talk to her. Came in after mechanical fall at her facility. Follow-up with distal fibular fracture on the left. Splint present. Orthopedic consultation with Dr. Rosita Kea. PT orders after seen by ortho. Received IV fluids. Patient has history of chronic hyponatremia. Hemodynamically stable. DC tele patient is DNR TOC for discharge planning PT OT orders will be placed after seen by Dr. Rosita Kea  no charge

## 2021-10-09 NOTE — Plan of Care (Signed)

## 2021-10-09 NOTE — TOC Progression Note (Addendum)
Transition of Care [facility]) - Progression Note    Patient Details  Name: Leetta Hendriks MRN: [id number] Date of Birth: December 13, 1935  Transition of Care [facility]) CM/SW Contact  Ashley Royalty Lutricia Feil, RN Phone Number:[phone] 10/09/2021, 3:07 PM  Clinical Narrative:    TOC has attempted to reach out to [facility] for the return of a patient however all calls have been unsuccessful to [name] and unable to leave a HIPAA voice message.  TOC will continue to reach out attempts for pt's return.  3: 25 pm-Addendum: Spoke with pt [name] who was willing to take the pt today  and contated the daughter [name] who indicated it would not be today via Dr. Allena Katz pt's needs a "cast". Confirm with Dr. Allena Katz that pt needs additional intervention and will not be ready for discharge today. Possibly tomorrow. [facility] ([name]) updated that pt will bot be ready for discharge today. Please reach out to [name] on pt's return when pt is stable for discharge.  TOC will continue to follow for ongoing discharge needs.         Expected Discharge Plan and Services                                                 Social Determinants of Health (SDOH) Interventions    Readmission Risk Interventions     No data to display

## 2021-10-09 NOTE — Consult Note (Signed)
Reason for Consult: Left ankle fracture Referring Physician: Dr. Janet Berlin Friske is an 86 y.o. adult.  HPI: Patient is an 86 year old who is poor historian.  She does not exactly know why she injured her ankle apparently had a fall she currently is in nursing home facility.  X-rays were obtained that show widening of the mortise and distal fibula lateral displacement with probable deltoid ligament and syndesmosis injury.  She apparently is a very minimal ambulator just doing 1-2 steps for transfers.  Past Medical History:  Diagnosis Date   A-fib [facility])    Anxiety    Chronic kidney disease    Depression    Hyperlipidemia    Hypertension    Iron deficiency    Vascular dementia [facility])     Past Surgical History:  Procedure Laterality Date   ESOPHAGOGASTRODUODENOSCOPY (EGD) WITH PROPOFOL N/A 07/01/2021   Procedure: ESOPHAGOGASTRODUODENOSCOPY (EGD) WITH PROPOFOL;  Surgeon: Wyline Mood, MD;  Location: [facility] ENDOSCOPY;  Service: Gastroenterology;  Laterality: N/A;   ESOPHAGOGASTRODUODENOSCOPY (EGD) WITH PROPOFOL N/A 07/27/2021   Procedure: ESOPHAGOGASTRODUODENOSCOPY (EGD) WITH PROPOFOL;  Surgeon: Toney Reil, MD;  Location: [facility] ENDOSCOPY;  Service: Gastroenterology;  Laterality: N/A;   PACEMAKER IMPLANT      History reviewed. No pertinent family history.  Social History:  reports that she has never smoked. She has never used smokeless tobacco. She reports that she does not drink alcohol and does not use drugs.  Allergies:  Allergies  Allergen Reactions   Iodine Swelling   Amlodipine Other (See Comments)   Nifedipine Swelling and Other (See Comments)   Iodinated Contrast Media    Lisinopril Swelling   Quinapril Swelling   Shellfish Allergy    Simvastatin Swelling    Medications: I have reviewed the patient's current medications.  Results for orders placed or performed during the hospital encounter of 10/08/21 (from the past 48 hour(s))  CBC with Differential     Status:  Abnormal   Collection Time: 10/08/21 10:16 PM  Result Value Ref Range   WBC 8.1 4.0 - 10.5 K/uL   RBC 4.25 3.87 - 5.11 MIL/uL   Hemoglobin 9.8 (L) 12.0 - 15.0 g/dL   HCT 00.9 (L) 38.1 - 82.9 %   MCV 76.2 (L) 80.0 - 100.0 fL   MCH 23.1 (L) 26.0 - 34.0 pg   MCHC 30.2 30.0 - 36.0 g/dL   RDW 93.7 (H) 16.9 - 67.8 %   Platelets 285 150 - 400 K/uL   nRBC 0.0 0.0 - 0.2 %   Neutrophils Relative % 72 %   Neutro Abs 5.8 1.7 - 7.7 K/uL   Lymphocytes Relative 14 %   Lymphs Abs 1.1 0.7 - 4.0 K/uL   Monocytes Relative 13 %   Monocytes Absolute 1.1 (H) 0.1 - 1.0 K/uL   Eosinophils Relative 0 %   Eosinophils Absolute 0.0 0.0 - 0.5 K/uL   Basophils Relative 0 %   Basophils Absolute 0.0 0.0 - 0.1 K/uL   Immature Granulocytes 1 %   Abs Immature Granulocytes 0.05 0.00 - 0.07 K/uL    Comment: Performed at [facility], [address] [place], [place] [address]  Comprehensive metabolic panel     Status: Abnormal   Collection Time: 10/08/21 10:16 PM  Result Value Ref Range   Sodium 124 (L) 135 - 145 mmol/L   Potassium 4.7 3.5 - 5.1 mmol/L   Chloride 88 (L) 98 - 111 mmol/L   CO2 28 22 - 32 mmol/L   Glucose, Bld  100 (H) 70 - 99 mg/dL    Comment: Glucose reference range applies only to samples taken after fasting for at least 8 hours.   BUN 12 8 - 23 mg/dL   Creatinine, Ser 1.61 (H) 0.44 - 1.00 mg/dL   Calcium 9.2 8.9 - 09.6 mg/dL   Total Protein 6.9 6.5 - 8.1 g/dL   Albumin 3.4 (L) 3.5 - 5.0 g/dL   AST 21 15 - 41 U/L   ALT 11 0 - 44 U/L   Alkaline Phosphatase 81 38 - 126 U/L   Total Bilirubin 0.6 0.3 - 1.2 mg/dL   GFR, Estimated 43 (L) >60 mL/min    Comment: (NOTE) Calculated using the CKD-EPI Creatinine Equation (2021)    Anion gap 8 5 - 15    Comment: Performed at [facility], [address] [place], [place] [address]  Urinalysis, Routine w reflex microscopic Urine, Clean Catch     Status: Abnormal   Collection Time: 10/08/21 10:16 PM  Result Value Ref Range    Color, Urine STRAW (A) YELLOW   APPearance CLEAR (A) CLEAR   Specific Gravity, Urine 1.002 (L) 1.005 - 1.030   pH 7.0 5.0 - 8.0   Glucose, UA NEGATIVE NEGATIVE mg/dL   Hgb urine dipstick NEGATIVE NEGATIVE   Bilirubin Urine NEGATIVE NEGATIVE   Ketones, ur NEGATIVE NEGATIVE mg/dL   Protein, ur NEGATIVE NEGATIVE mg/dL   Nitrite NEGATIVE NEGATIVE   Leukocytes,Ua NEGATIVE NEGATIVE    Comment: Performed at [facility], [address] [place], [place] [address]  Procalcitonin - Baseline     Status: None   Collection Time: 10/08/21 10:36 PM  Result Value Ref Range   Procalcitonin <0.10 ng/mL    Comment:        Interpretation: PCT (Procalcitonin) <= 0.5 ng/mL: Systemic infection (sepsis) is not likely. Local bacterial infection is possible. (NOTE)       Sepsis PCT Algorithm           Lower Respiratory Tract                                      Infection PCT Algorithm    ----------------------------     ----------------------------         PCT < 0.25 ng/mL                PCT < 0.10 ng/mL          Strongly encourage             Strongly discourage   discontinuation of antibiotics    initiation of antibiotics    ----------------------------     -----------------------------       PCT 0.25 - 0.50 ng/mL            PCT 0.10 - 0.25 ng/mL               OR       >80% decrease in PCT            Discourage initiation of                                            antibiotics      Encourage discontinuation           of antibiotics    ----------------------------     -----------------------------  PCT >= 0.50 ng/mL              PCT 0.26 - 0.50 ng/mL               AND        <80% decrease in PCT             Encourage initiation of                                             antibiotics       Encourage continuation           of antibiotics    ----------------------------     -----------------------------        PCT >= 0.50 ng/mL                  PCT > 0.50 ng/mL                AND         increase in PCT                  Strongly encourage                                      initiation of antibiotics    Strongly encourage escalation           of antibiotics                                     -----------------------------                                           PCT <= 0.25 ng/mL                                                 OR                                        > 80% decrease in PCT                                      Discontinue / Do not initiate                                             antibiotics  Performed at [facility], [address] [place], [place] [id number]   Troponin I (High Sensitivity)     Status: None   Collection Time: 10/08/21 10:36 PM  Result Value Ref Range   Troponin I (High Sensitivity) 13 <18 ng/L    Comment: (NOTE) Elevated high sensitivity troponin I (hsTnI) values and significant  changes across serial  measurements may suggest ACS but many other  chronic and acute conditions are known to elevate hsTnI results.  Refer to the "Links" section for chest pain algorithms and additional  guidance. Performed at [facility], [address] [place], [place] [address]   Culture, blood (routine x 2)     Status: None (Preliminary result)   Collection Time: 10/08/21 11:04 PM   Specimen: BLOOD LEFT FOREARM  Result Value Ref Range   Specimen Description BLOOD LEFT FOREARM    Special Requests      BOTTLES DRAWN AEROBIC AND ANAEROBIC Blood Culture adequate volume   Culture      NO GROWTH < 12 HOURS Performed at [facility], [address] [place], [place] [address]    Report Status PENDING   Lactic acid, plasma     Status: None   Collection Time: 10/08/21 11:04 PM  Result Value Ref Range   Lactic Acid, Venous 1.6 0.5 - 1.9 mmol/L    Comment: Performed at [facility], [address] [place], [place] [address]  SARS Coronavirus 2 by RT PCR (hospital order, performed in [facility] hospital lab) *cepheid single result test* Urine, Clean Catch     Status: None   Collection Time: 10/08/21 11:04 PM   Specimen: Urine, Clean Catch; Nasal Swab  Result Value Ref Range   SARS Coronavirus 2 by RT PCR NEGATIVE NEGATIVE    Comment: (NOTE) SARS-CoV-2 target nucleic acids are NOT DETECTED.  The SARS-CoV-2 RNA is generally detectable in upper and lower respiratory specimens during the acute phase of infection. The lowest concentration of SARS-CoV-2 viral copies this assay can detect is 250 copies / mL. A negative result does not preclude SARS-CoV-2 infection and should not be used as the sole basis for treatment or other patient management decisions.  A negative result may occur with improper specimen collection / handling, submission of specimen other than nasopharyngeal swab, presence of viral mutation(s) within the areas targeted by this assay, and inadequate number of viral copies (<250 copies / mL). A negative result must be combined with clinical observations, patient history, and epidemiological information.  Fact Sheet for Patients:   RoadLapTop.co.za  Fact Sheet for Healthcare Providers: http://kim-miller.com/  This test is not yet approved or  cleared by the [place] FDA and has been authorized for detection and/or diagnosis of SARS-CoV-2 by FDA under an Emergency Use Authorization (EUA).  This EUA will remain in effect (meaning this test can be used) for the duration of the COVID-19 declaration under Section 564(b)(1) of the Act, 21 U.S.C. section 360bbb-3(b)(1), unless the authorization is terminated or revoked sooner.  Performed at [facility], [address] [place], [place] [address]   Basic metabolic panel     Status: Abnormal   Collection Time: 10/09/21  1:01 AM  Result Value Ref Range   Sodium 123 (L) 135 - 145 mmol/L   Potassium 4.8 3.5 - 5.1 mmol/L   Chloride 88 (L) 98 - 111  mmol/L   CO2 27 22 - 32 mmol/L   Glucose, Bld 96 70 - 99 mg/dL    Comment: Glucose reference range applies only to samples taken after fasting for at least 8 hours.   BUN 12 8 - 23 mg/dL   Creatinine, Ser 0.10 (H) 0.44 - 1.00 mg/dL   Calcium 9.2 8.9 - 93.2 mg/dL   GFR, Estimated 43 (L) >60 mL/min    Comment: (NOTE) Calculated using the CKD-EPI Creatinine Equation (2021)  Anion gap 8 5 - 15    Comment: Performed at [facility], [address] [place], [place] [address]  Magnesium     Status: None   Collection Time: 10/09/21  1:01 AM  Result Value Ref Range   Magnesium 2.2 1.7 - 2.4 mg/dL    Comment: Performed at [facility], [address] [place], [place] [address]  TSH     Status: None   Collection Time: 10/09/21  1:01 AM  Result Value Ref Range   TSH 1.197 0.350 - 4.500 uIU/mL    Comment: Performed by a 3rd Generation assay with a functional sensitivity of <=0.01 uIU/mL. Performed at [facility], [address] [place], [place] [address]   Osmolality     Status: Abnormal   Collection Time: 10/09/21  1:01 AM  Result Value Ref Range   Osmolality 266 (L) 275 - 295 mOsm/kg    Comment: Performed at [facility], [address] [place], [place] [address]  Culture, blood (routine x 2)     Status: None (Preliminary result)   Collection Time: 10/09/21  3:03 AM   Specimen: BLOOD LEFT HAND  Result Value Ref Range   Specimen Description BLOOD LEFT HAND    Special Requests      BOTTLES DRAWN AEROBIC AND ANAEROBIC Blood Culture adequate volume   Culture      NO GROWTH < 12 HOURS Performed at [facility], [address] [place], [place] [address]    Report Status PENDING   Basic metabolic panel     Status: Abnormal   Collection Time: 10/09/21  3:03 AM  Result Value Ref Range   Sodium 126 (L) 135 - 145 mmol/L   Potassium 4.5 3.5 - 5.1 mmol/L   Chloride 90 (L) 98 - 111 mmol/L   CO2 28 22 - 32 mmol/L   Glucose, Bld 101 (H)  70 - 99 mg/dL    Comment: Glucose reference range applies only to samples taken after fasting for at least 8 hours.   BUN 13 8 - 23 mg/dL   Creatinine, Ser 8.81 (H) 0.44 - 1.00 mg/dL   Calcium 9.4 8.9 - 10.3 mg/dL   GFR, Estimated 44 (L) >60 mL/min    Comment: (NOTE) Calculated using the CKD-EPI Creatinine Equation (2021)    Anion gap 8 5 - 15    Comment: Performed at [facility], [address] [place], [place] [address]  CBC     Status: Abnormal   Collection Time: 10/09/21  3:03 AM  Result Value Ref Range   WBC 7.9 4.0 - 10.5 K/uL   RBC 4.13 3.87 - 5.11 MIL/uL   Hemoglobin 9.5 (L) 12.0 - 15.0 g/dL   HCT 85.9 (L) 29.2 - 44.6 %   MCV 75.5 (L) 80.0 - 100.0 fL   MCH 23.0 (L) 26.0 - 34.0 pg   MCHC 30.4 30.0 - 36.0 g/dL   RDW 28.6 (H) 38.1 - 77.1 %   Platelets 255 150 - 400 K/uL   nRBC 0.0 0.0 - 0.2 %    Comment: Performed at [facility], [address] [place], [place] [address]  Basic metabolic panel     Status: Abnormal   Collection Time: 10/09/21  8:56 AM  Result Value Ref Range   Sodium 128 (L) 135 - 145 mmol/L   Potassium 4.3 3.5 - 5.1 mmol/L   Chloride 93 (L) 98 - 111 mmol/L   CO2 26 22 - 32 mmol/L  Glucose, Bld 95 70 - 99 mg/dL    Comment: Glucose reference range applies only to samples taken after fasting for at least 8 hours.   BUN 12 8 - 23 mg/dL   Creatinine, Ser 9.60 (H) 0.44 - 1.00 mg/dL   Calcium 9.2 8.9 - 45.4 mg/dL   GFR, Estimated 45 (L) >60 mL/min    Comment: (NOTE) Calculated using the CKD-EPI Creatinine Equation (2021)    Anion gap 9 5 - 15    Comment: Performed at [facility], [address] [place], [place] [address]  Basic metabolic panel     Status: Abnormal   Collection Time: 10/09/21 12:44 PM  Result Value Ref Range   Sodium 130 (L) 135 - 145 mmol/L   Potassium 4.7 3.5 - 5.1 mmol/L   Chloride 95 (L) 98 - 111 mmol/L   CO2 29 22 - 32 mmol/L   Glucose, Bld 99 70 - 99 mg/dL    Comment: Glucose reference range  applies only to samples taken after fasting for at least 8 hours.   BUN 12 8 - 23 mg/dL   Creatinine, Ser 9.14 (H) 0.44 - 1.00 mg/dL   Calcium 9.1 8.9 - 78.2 mg/dL   GFR, Estimated 44 (L) >60 mL/min    Comment: (NOTE) Calculated using the CKD-EPI Creatinine Equation (2021)    Anion gap 6 5 - 15    Comment: Performed at [facility], [address] [place], [place] [address]    CT Head Wo Contrast  Result Date: 10/09/2021 CLINICAL DATA:  Altered mental status. EXAM: CT HEAD WITHOUT CONTRAST TECHNIQUE: Contiguous axial images were obtained from the base of the skull through the vertex without intravenous contrast. RADIATION DOSE REDUCTION: This exam was performed according to the departmental dose-optimization program which includes automated exposure control, adjustment of the mA and/or kV according to patient size and/or use of iterative reconstruction technique. COMPARISON:  Head CT dated 11/05/2020. FINDINGS: Brain: Mild age-related atrophy and chronic microvascular ischemic changes. There is no acute intracranial hemorrhage. No mass effect or midline shift. No extra-axial fluid collection. Vascular: No hyperdense vessel or unexpected calcification. Skull: Normal. Negative for fracture or focal lesion. Sinuses/Orbits: No acute finding. Other: None IMPRESSION: 1. No acute intracranial pathology. 2. Mild age-related atrophy and chronic microvascular ischemic changes. Electronically Signed   By: Elgie Collard M.D.   On: 10/09/2021 00:18   DG Chest Port 1 View  Result Date: 10/08/2021 CLINICAL DATA:  Altered mental status EXAM: PORTABLE CHEST 1 VIEW COMPARISON:  07/01/2021 FINDINGS: Cardiac shadow is enlarged but stable. Aortic calcifications are again seen. Implantable pacemaker is noted and stable. Mild increased central vascular congestion is noted. No sizable effusion or focal infiltrate is noted. IMPRESSION: Mild vascular congestion. Electronically Signed   By: Alcide Clever M.D.    On: 10/08/2021 23:30   DG Ankle Complete Left  Result Date: 10/08/2021 CLINICAL DATA:  Pain and swelling in the ankle, initial encounter EXAM: LEFT ANKLE COMPLETE - 3+ VIEW COMPARISON:  None Available. FINDINGS: Oblique fracture is noted in the distal diaphysis of the fibula. No definitive tibial fracture is seen. Mild soft tissue swelling is noted. Widening of the tibiotalar space is noted on the lateral projection. No other fractures are seen. Flattening of the plantar arch is noted. IMPRESSION: Oblique fracture through the distal fibular diaphysis is noted. Some widening of the tibiotalar space is noted on the lateral projection. Flattening of the plantar arch is seen. Electronically Signed   By: Loraine Leriche  Lukens M.D.   On: 10/08/2021 23:17    Review of Systems Blood pressure (!) 150/48, pulse (!) 59, temperature 97.6 F (36.4 C), resp. rate 16, height 5\' 7"  (1.702 m), weight 95.4 kg, SpO2 100 %. Physical Exam Both ankles have moderate edema the have flatfoot deformity there is more swelling to the left without ecchymosis and skin is intact.  She has tenderness to medial and lateral ankle.  There is no crepitation to palpation but there is a palpable deformity at the distal fibula. Assessment/Plan: Displaced distal fibula fracture with widening of the mortise consistent with syndesmosis rupture.  Normally this is something that would be treated operatively with internal fixation but with her being minimally ambulatory that is probably not necessary.  She is placed in a cast today trying to mold this to a better position and x-ray will be taken with the cast on she will be toe-touch weightbearing.  10/09/2021, 5:47 PM

## 2021-10-10 DIAGNOSIS — S82832A Other fracture of upper and lower end of left fibula, initial encounter for closed fracture: Secondary | ICD-10-CM | POA: Diagnosis not present

## 2021-10-10 DIAGNOSIS — I482 Chronic atrial fibrillation, unspecified: Secondary | ICD-10-CM | POA: Diagnosis not present

## 2021-10-10 DIAGNOSIS — S82839A Other fracture of upper and lower end of unspecified fibula, initial encounter for closed fracture: Secondary | ICD-10-CM

## 2021-10-10 DIAGNOSIS — E871 Hypo-osmolality and hyponatremia: Secondary | ICD-10-CM | POA: Diagnosis not present

## 2021-10-10 DIAGNOSIS — N1832 Chronic kidney disease, stage 3b: Secondary | ICD-10-CM | POA: Diagnosis not present

## 2021-10-10 MED ORDER — TRAMADOL HCL 50 MG PO TABS
50.0000 mg | ORAL_TABLET | Freq: Four times a day (QID) | ORAL | Status: DC | PRN
  Administered 2021-10-10: 50 mg via ORAL
  Filled 2021-10-10: qty 1

## 2021-10-10 MED ORDER — KETOROLAC TROMETHAMINE 15 MG/ML IJ SOLN
15.0000 mg | Freq: Once | INTRAMUSCULAR | Status: AC
  Administered 2021-10-10: 15 mg via INTRAVENOUS
  Filled 2021-10-10: qty 1

## 2021-10-10 MED ORDER — TRAMADOL HCL 50 MG PO TABS: 50.0000 mg | ORAL_TABLET | Freq: Four times a day (QID) | ORAL | 0 refills | Status: DC | PRN

## 2021-10-10 NOTE — Evaluation (Signed)
Occupational Therapy Evaluation Patient Details Name: Katrina Chen MRN: [id number] DOB: 13-Sep-1935 Today's Date: 10/10/2021   History of Present Illness Pt is an 86 y.o. female presenting to hospital from [facility] with AMS and s/p fall; L ankle and calf are tender and hot to touch; pt non-verbal and grunting but answering yes and no questions; also reports of low sodium.  Pt admitted with hyponatremia, L ankle fx (distal fibular fx), AMS (h/o dementia and bipolar disorder).  PMH includes a-fib, anxiety, CKD, depression, htn, vascular dementia, gastric atrophy, UTI, spinal stenosis, h/o bipolar disorder, h/o back surgery, chronic knee pain, and pacemaker implant.   Clinical Impression   Katrina Chen was seen for OT/PT co-evaluation this date. Prior to hospital admission, pt was receiving care in a STR facility. Pt noted with history of cognitive impairments at baseline. Per chart, pt was minimally ambulatory, able to take a few steps to a WC prior to this hospital stay. Currently pt demonstrates impairments as described below (See OT problem list) which functionally limit her ability to perform ADL/self-care tasks. Pt currently requires MOD-MAX A for LB ADL management, MOD A for bed/functional mobility with +2 for safety and to ensure adherence to NWB orders in her LLE. Pt would benefit from skilled OT services to address noted impairments and functional limitations (see below for any additional details) in order to maximize safety and independence while minimizing falls risk and caregiver burden. Upon hospital discharge, recommend STR to maximize pt safety and return to PLOF.        Recommendations for follow up therapy are one component of a multi-disciplinary discharge planning process, led by the attending physician.  Recommendations may be updated based on patient status, additional functional criteria and insurance authorization.   Follow Up Recommendations  Skilled nursing-short term rehab (<3  hours/day) (at facility)    Assistance Recommended at Discharge Frequent or constant Supervision/Assistance  Patient can return home with the following A lot of help with walking and/or transfers;A lot of help with bathing/dressing/bathroom;Assistance with cooking/housework    Functional Status Assessment  Patient has had a recent decline in their functional status and demonstrates the ability to make significant improvements in function in a reasonable and predictable amount of time.  Equipment Recommendations  Other (comment) (Defer)    Recommendations for Other Services       Precautions / Restrictions Precautions Precautions: Fall Required Braces or Orthoses: Splint/Cast Splint/Cast: L LE Restrictions Weight Bearing Restrictions: Yes LLE Weight Bearing: Non weight bearing Other Position/Activity Restrictions: Per orders: TTWB L leg with cast in place for transfers; would not recommend trying ambulation until ankle is healed      Mobility Bed Mobility Overal bed mobility: Needs Assistance Bed Mobility: Supine to Sit, Sit to Supine     Supine to sit: Supervision, HOB elevated Sit to supine: Min assist, Mod assist, +2 for physical assistance   General bed mobility comments: 2 assist to boost pt up in bed end of session    Transfers Overall transfer level: Needs assistance Equipment used: 1 person hand held assist Transfers: Bed to chair/wheelchair/BSC     Squat pivot transfers: Max assist, Min assist, +2 safety/equipment       General transfer comment: max assist x1 plus min assist of 2nd for L LE squat pivot bed to recliner towards R side; max assist x2 lateral scoot to L recliner to bed; vc's for technique and positioning. +2 assist to support LLE NWB during transfers      Balance  Overall balance assessment: Needs assistance Sitting-balance support: Single extremity supported, Feet unsupported Sitting balance-Leahy Scale: Fair Sitting balance - Comments: pt  with intermittent posterior lean requiring min assist and cueing for balance       Standing balance comment: Not tested.                           ADL either performed or assessed with clinical judgement   ADL Overall ADL's : Needs assistance/impaired                                       General ADL Comments: Pt functionally limited by increased pain and decreased functional use of her LLE. She requires SET UP assist for meal tray items and self feeding at end of sesison. SUPERVISION for initial bed mobility but requires assist for BLE elevation during SIT>SUP. MAX A for squat pivot to/from recliner.     Vision Patient Visual Report: No change from baseline       Perception     Praxis      Pertinent Vitals/Pain Pain Assessment Pain Assessment: 0-10 Pain Score: 5  Pain Location: L ankle Pain Descriptors / Indicators: Sore, Guarding, Grimacing Pain Intervention(s): Limited activity within patient's tolerance, Monitored during session, RN gave pain meds during session, Repositioned     Hand Dominance     Extremity/Trunk Assessment Upper Extremity Assessment Upper Extremity Assessment: Generalized weakness (grossly 3/5 t/o BUE. No focal weakness appreciated.)   Lower Extremity Assessment Lower Extremity Assessment: LLE deficits/detail LLE Deficits / Details: L ankle cast in place; pt remained NWB per orders. LLE: Unable to fully assess due to immobilization   Cervical / Trunk Assessment Cervical / Trunk Assessment: Other exceptions Cervical / Trunk Exceptions: forward head/shoulders   Communication Communication Communication:  (intermittently difficult to understand pt (garbled speech))   Cognition Arousal/Alertness: Awake/alert Behavior During Therapy: WFL for tasks assessed/performed Overall Cognitive Status: No family/caregiver present to determine baseline cognitive functioning                                 General  Comments: Oriented to person, place, general situation, and year.  Increased time to follow cues. Per chart, pt has hx of dementia at baseline.     General Comments  LLE Cast in place at start/end of session    Exercises Other Exercises Other Exercises: Pt educated on NWB precuations through her LLE, compensatory strategies for ADL management in setting of NWB status. OT/PT facilitated functional mobility during session.   Shoulder Instructions      Home Living Family/patient expects to be discharged to:: Assisted living                                 Additional Comments: [facility] long term care      Prior Functioning/Environment Prior Level of Function : Needs assist       Physical Assist : Mobility (physical)     Mobility Comments: Per MD "spoke with [facility] staff and patient's daughter on the phone. Patient has baseline dementia. Last couple weeks she has been wheelchair-bound due to weakness. She has had falls at the facility. Per daughter she would take 1 to 2 steps at [facility]. Before that used  to take 4 to 5 steps. Wheelchair-bound most of the time per staff." ADLs Comments: Assist from staff with ADLs        OT Problem List: Decreased strength;Decreased coordination;Pain;Decreased range of motion;Decreased cognition;Decreased activity tolerance;Decreased safety awareness;Impaired balance (sitting and/or standing);Decreased knowledge of use of DME or AE;Decreased knowledge of precautions      OT Treatment/Interventions: Self-care/ADL training;Therapeutic activities;Therapeutic exercise;Energy conservation;DME and/or AE instruction;Patient/family education;Balance training    OT Goals(Current goals can be found in the care plan section) Acute Rehab OT Goals Patient Stated Goal: To go home OT Goal Formulation: With patient Time For Goal Achievement: 10/24/21 Potential to Achieve Goals: Good ADL Goals Pt Will Perform Lower Body Dressing: with min  assist;sitting/lateral leans;with adaptive equipment (c LRAD PRN) Pt Will Transfer to Toilet: bedside commode;with mod assist;squat pivot transfer (c LRAD PRN) Pt Will Perform Toileting - Clothing Manipulation and hygiene: sitting/lateral leans;with min assist (c LRAD PRN)  OT Frequency: Min 2X/week    Co-evaluation   Reason for Co-Treatment: For patient/therapist safety;To address functional/ADL transfers PT goals addressed during session: Mobility/safety with mobility OT goals addressed during session: ADL's and self-care      AM-PAC OT "6 Clicks" Daily Activity     Outcome Measure Help from another person eating meals?: A Little Help from another person taking care of personal grooming?: A Little Help from another person toileting, which includes using toliet, bedpan, or urinal?: A Lot Help from another person bathing (including washing, rinsing, drying)?: A Lot Help from another person to put on and taking off regular upper body clothing?: A Little Help from another person to put on and taking off regular lower body clothing?: A Lot 6 Click Score: 15   End of Session Equipment Utilized During Treatment: Gait belt Nurse Communication: Mobility status  Activity Tolerance: Patient tolerated treatment well Patient left: in bed;with call bell/phone within reach;with bed alarm set  OT Visit Diagnosis: Other abnormalities of gait and mobility (R26.89);Pain Pain - Right/Left: Left Pain - part of body: Leg;Ankle and joints of foot                Time: 2202-5427 OT Time Calculation (min): 25 min Charges:  OT General Charges $OT Visit: 1 Visit OT Evaluation $OT Eval Moderate Complexity: 1 Mod OT Treatments $Self Care/Home Management : 8-22 mins  Rockney Ghee, M.S., OTR/L Ascom: [phone] 10/10/21, 12:07 PM]

## 2021-10-10 NOTE — Plan of Care (Signed)
  Problem: Education: Goal: Knowledge of General Education information will improve Description: Including pain rating scale, medication(s)/side effects and non-pharmacologic comfort measures Outcome: Progressing   Problem: Health Behavior/Discharge Planning: Goal: Ability to manage health-related needs will improve Outcome: Progressing   Problem: Clinical Measurements: Goal: Diagnostic test results will improve Outcome: Progressing   Problem: Clinical Measurements: Goal: Respiratory complications will improve Outcome: Progressing   Problem: Activity: Goal: Risk for activity intolerance will decrease Outcome: Progressing   Problem: Nutrition: Goal: Adequate nutrition will be maintained Outcome: Progressing   Problem: Coping: Goal: Level of anxiety will decrease Outcome: Progressing   Problem: Elimination: Goal: Will not experience complications related to urinary retention Outcome: Progressing   Problem: Pain Managment: Goal: General experience of comfort will improve Outcome: Progressing

## 2021-10-10 NOTE — Progress Notes (Signed)
Attempted to call the nurse at [facility], my number was given to the Receptionist for the nurse to call me back.Patient is waiting for transport.

## 2021-10-10 NOTE — NC FL2 (Signed)
[place] MEDICAID FL2 LEVEL OF CARE SCREENING TOOL     IDENTIFICATION  Patient Name: Katrina Chen Birthdate: 1935-04-26 Sex: adult Admission Date (Current Location): 10/08/2021  [facility] and [place] Number:  Chiropodist and Address:  [facility], [address], [place], [place] [address]      Provider Number: [id number]  Attending Physician Name and Address:  Enedina Finner, MD  Relative Name and Phone Number:       Current Level of Care: Hospital Recommended Level of Care: Skilled Nursing Facility Prior Approval Number:    Date Approved/Denied:   PASRR Number:    Discharge Plan: SNF    Current Diagnoses: Patient Active Problem List   Diagnosis Date Noted   Dementia (HCC) 10/09/2021   Fall 10/09/2021   Gastric atrophy    Gastric polyps    Acute UTI 06/16/2021   General weakness 06/16/2021   Pleural effusion w/ enlarged cardiac silhouette on CXR, trace edema 06/16/2021   Spinal stenosis of lumbar region 11/12/2020   Hyponatremia 11/12/2020   Nonspecific chest pain 11/05/2020   CAD (coronary artery disease) 11/05/2020   Hyperkalemia 11/05/2020   Obstipation 11/05/2020   History of bipolar disorder 11/05/2020   Atrial fibrillation, chronic (HCC) 11/05/2020   CKD (chronic kidney disease), stage III (HCC) 11/05/2020   Weakness 11/05/2020   Protein-calorie malnutrition (HCC) 11/05/2020   Disorder of refraction 06/05/2019   Dry eye syndrome of both eyes 06/05/2019   Posterior vitreous detachment of left eye 06/05/2019   Presence of intraocular lens 06/05/2019   Bipolar I disorder (HCC) 12/11/2013   Status post total bilateral knee replacement 05/27/2013   Previous back surgery 05/27/2013   Essential hypertension 04/22/2013   Knee pain, chronic 04/22/2013    Orientation RESPIRATION BLADDER Height & Weight     Situation, Self  Normal Incontinent Weight: 210 lb 5.1 oz (95.4 kg) Height:  5\' 7"  (170.2 cm)  BEHAVIORAL SYMPTOMS/MOOD  NEUROLOGICAL BOWEL NUTRITION STATUS      Continent Diet (heart healthy, thin liquids)  AMBULATORY STATUS COMMUNICATION OF NEEDS Skin   Extensive Assist Verbally Normal                       Personal Care Assistance Level of Assistance  Feeding, Dressing, Bathing Bathing Assistance: Maximum assistance Feeding assistance: Independent Dressing Assistance: Maximum assistance     Functional Limitations Info  Sight, Hearing, Speech Sight Info: Adequate Hearing Info: Adequate Speech Info: Adequate    SPECIAL CARE FACTORS FREQUENCY  PT (By licensed PT), OT (By licensed OT)     PT Frequency: 5x OT Frequency: 5x            Contractures Contractures Info: Not present    Additional Factors Info  Code Status, Allergies Code Status Info: DNR Allergies Info: Iodine, Amlodipine, Nifedipine, Iodinated Contrast Media, Lisinopril, Quinapril, Shellfish Allergy, Simvastatin           Current Medications (10/10/2021):  This is the current hospital active medication list Current Facility-Administered Medications  Medication Dose Route Frequency Provider Last Rate Last Admin   acetaminophen (TYLENOL) tablet 650 mg  650 mg Oral Q6H PRN 10/12/2021, MD   650 mg at 10/09/21 2226   Or   acetaminophen (TYLENOL) suppository 650 mg  650 mg Rectal Q6H PRN 2227, MD       apixaban Synetta Fail) tablet 2.5 mg  2.5 mg Oral BID Everlene Balls, MD   2.5 mg at 10/10/21 [phone]  carvedilol (COREG) tablet 25 mg  25 mg Oral BID Synetta Fail, MD   25 mg at 10/10/21 0847   divalproex (DEPAKOTE SPRINKLE) capsule 125 mg  125 mg Oral BID Synetta Fail, MD   125 mg at 10/09/21 2227   hydrALAZINE (APRESOLINE) tablet 25 mg  25 mg Oral TID Synetta Fail, MD   25 mg at 10/10/21 0847   losartan (COZAAR) tablet 100 mg  100 mg Oral Daily Synetta Fail, MD   100 mg at 10/10/21 0847   OLANZapine (ZYPREXA) tablet 2.5 mg  2.5 mg Oral QHS Synetta Fail, MD   2.5 mg at  10/09/21 2227   pantoprazole (PROTONIX) EC tablet 40 mg  40 mg Oral Daily Synetta Fail, MD   40 mg at 10/10/21 0847   polyethylene glycol (MIRALAX / GLYCOLAX) packet 17 g  17 g Oral Daily PRN Synetta Fail, MD       sodium chloride flush (NS) 0.9 % injection 3 mL  3 mL Intravenous Q12H Synetta Fail, MD   3 mL at 10/09/21 2234   traMADol (ULTRAM) tablet 50 mg  50 mg Oral Q6H PRN Enedina Finner, MD       venlafaxine XR (EFFEXOR-XR) 24 hr capsule 75 mg  75 mg Oral Daily Synetta Fail, MD   75 mg at 10/10/21 1884     Discharge Medications: Please see discharge summary for a list of discharge medications.  Relevant Imaging Results:  Relevant Lab Results:   Additional Information SSN:[id number]  Maree Krabbe, LCSW
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
## 2021-10-10 NOTE — Progress Notes (Signed)
[facility] nurse returned call and report was given , patient is waiting transport by EMS, daughter is at bedside.

## 2021-10-10 NOTE — Discharge Instructions (Addendum)
NWB on left LE

## 2021-10-10 NOTE — TOC Transition Note (Addendum)
Transition of Care [facility]) - CM/SW Discharge Note   Patient Details  Name: Katrina Chen MRN: [id number] Date of Birth: [birth year]  Transition of Care [facility]) CM/SW Contact:  Maree Krabbe, LCSW Phone Number: 10/10/2021, 9:35 AM   Clinical Narrative:   Clinical Social Worker facilitated patient discharge including contacting patient family and facility to confirm patient discharge plans.  Clinical information faxed to facility and family (pt's daughter- [name]) agreeable with plan.  CSW arranged ambulance transport via [facility] to [facility] ([phone] p)  RN to call [phone] for report prior to discharge.  EMS scheduled, RN aware.    Final next level of care: Skilled Nursing Facility Barriers to Discharge: No Barriers Identified   Patient Goals and CMS Choice        Discharge Placement              Patient chooses bed at:  ([facility]) Patient to be transferred to facility by: [facility] Name of family member notified: daughter- [name] Patient and family notified of of transfer: 10/10/21  Discharge Plan and Services                                     Social Determinants of Health (SDOH) Interventions     Readmission Risk Interventions     No data to display

## 2021-10-10 NOTE — Progress Notes (Addendum)
       CROSS COVER NOTE  NAME: Neisha Hinger MRN: [id number] DOB : 1935-10-31    Date of Service   10/10/2021  HPI/Events of Note   Medication request received for pain meds. M(r)s Salvas is reporting 9/10 ankle pain unrelieved by tylenol.  Interventions   Plan: IV Toradol x1      This document was prepared using Dragon voice recognition software and may include unintentional dictation errors.  Bishop Limbo DNP, MHA, FNP-BC Nurse Practitioner [facility] Pager [phone]

## 2021-10-10 NOTE — Progress Notes (Signed)
X-ray post casting shows well aligned ankle with improvement of the ankle mortise.  This should be acceptable alignment.  She will need to wear the cast for 3 weeks and then follow-up with me in the office for cast change and probably repeat casting versus bracing depending on whether there is callus present.  Right ankle has similar deformity with flatfoot deformity but x-ray is normal.

## 2021-10-10 NOTE — Plan of Care (Signed)

## 2021-10-10 NOTE — Discharge Summary (Signed)
Physician Discharge Summary   Patient: Katrina Chen MRN: [id number] DOB: 10-18-1935  Admit date:     10/08/2021  Discharge date: 10/10/21  Discharge Physician: Enedina Finner   PCP: Wardell Honour, FNP   Recommendations at discharge:    F/u Dr Rosita Kea ortho in 3 weeks for f/u left LE fracture  Discharge Diagnoses: Left distal fibular fracture s/p mechanical fall--now has cast  Hospital Course: Katrina Chen is a 86 y.o. adult with medical history significant of CAD, atrial fibrillation, hypertension, CKD 3B, bipolar, dementia presenting from facility with altered mental status.  Hyponatremia chronic > Patient noted to have low sodium facility and has had this intermittently in the past with chart review showing sodium around 130 frequently.  --came in with sodium of 124--130 > Unclear etiology though with the chronicity may be related to her antipsychotics.   Left ankle fracture/distal fibular fracture post fall --seen by ortho Dr Rosita Kea-- Left LE cast and f/u in 3 weeks --conservative mnx since at baseline per staff [facility] and dter cynthia pt mainly WC bound and takes 1-2 steps   Altered mental status Dementia Bipolar disorder > Patient presenting with reported altered mental status in the setting of dementia and bipolar disorder. - Continue home olanzapine, venlafaxine, Depakote --sitter d/ced. Pt is calm   CAD - Continue home carvedilol, losartan  Atrial fibrillation - Continue home carvedilol, Eliquis  CKD 3B > Creatinine stable at 1.24 --stable  Hypertension - Continue home hydralazine, losartan, carvedilol - Is also on weekly clonidine patch  Overall stable. Seen by PT. D/c to [facility]   DVT prophylaxis:      Eliquis Code Status:              DNR Family Communication:       Attempted to contact daughter by phone, but there was no answer today. Was able to speak with dter [name] yday and she is aware of the plan        Consultants: ortho dr Rosita Kea Procedures  performed: left LE cast placed  Disposition: Rehabilitation facility Diet recommendation:  Discharge Diet Orders (From admission, onward)     Start     Ordered   10/10/21 0000  Diet - low sodium heart healthy        10/10/21 0851           Cardiac diet DISCHARGE MEDICATION: Allergies as of 10/10/2021       Reactions   Iodine Swelling   Amlodipine Other (See Comments)   Nifedipine Swelling, Other (See Comments)   Iodinated Contrast Media    Lisinopril Swelling   Quinapril Swelling   Shellfish Allergy    Simvastatin Swelling        Medication List     STOP taking these medications    chlorthalidone 25 MG tablet Commonly known as: HYGROTON       TAKE these medications    Alumina-Magnesia-Simethicone 200-200-20 MG/5ML suspension Generic drug: alum & mag hydroxide-simeth Take 15 mLs by mouth every 6 (six) hours as needed for indigestion or heartburn.   apixaban 2.5 MG Tabs tablet Commonly known as: ELIQUIS Take 2.5 mg by mouth 2 (two) times daily.   bisacodyl 10 MG suppository Commonly known as: DULCOLAX Place 1 suppository (10 mg total) rectally daily as needed for mild constipation.   carvedilol 25 MG tablet Commonly known as: COREG Take 25 mg by mouth 2 (two) times daily with a meal.   cloNIDine 0.3 mg/24hr patch Commonly known as: CATAPRES - Dosed  in mg/24 hr Place 0.3 mg onto the skin every Friday.   cycloSPORINE 0.05 % ophthalmic emulsion Commonly known as: RESTASIS Place 1 drop into both eyes 2 (two) times daily.   divalproex 125 MG capsule Commonly known as: DEPAKOTE SPRINKLE Take 125 mg by mouth 2 (two) times daily.   fluticasone 50 MCG/ACT nasal spray Commonly known as: FLONASE Place 1 spray into both nostrils daily.   gabapentin 100 MG capsule Commonly known as: NEURONTIN Take 100 mg by mouth every 8 (eight) hours.   hydrALAZINE 100 MG tablet Commonly known as: APRESOLINE Take 25 mg by mouth every 8 (eight) hours.   iron  polysaccharides 150 MG capsule Commonly known as: NIFEREX Take 150 mg by mouth daily.   levocetirizine 5 MG tablet Commonly known as: XYZAL Take 5 mg by mouth daily.   losartan 100 MG tablet Commonly known as: COZAAR Take 100 mg by mouth daily.   melatonin 5 MG Tabs Take 10 mg by mouth at bedtime.   methocarbamol 500 MG tablet Commonly known as: ROBAXIN Take 1 tablet (500 mg total) by mouth every 8 (eight) hours as needed for muscle spasms.   OLANZapine 2.5 MG tablet Commonly known as: ZYPREXA Take 2.5 mg by mouth at bedtime.   olopatadine 0.1 % ophthalmic solution Commonly known as: PATANOL Place 1 drop into both eyes 2 (two) times daily.   omeprazole 20 MG capsule Commonly known as: PRILOSEC Take 20 mg by mouth daily.   ondansetron 4 MG disintegrating tablet Commonly known as: ZOFRAN-ODT Take 4 mg by mouth every 8 (eight) hours as needed for vomiting or nausea.   polyethylene glycol 17 g packet Commonly known as: MIRALAX / GLYCOLAX Take 17 g by mouth 2 (two) times daily. What changed: when to take this   prazosin 1 MG capsule Commonly known as: MINIPRESS Take 1 mg by mouth at bedtime.   senna-docusate 8.6-50 MG tablet Commonly known as: Senokot-S Take 1 tablet by mouth 2 (two) times daily. What changed: how much to take   simethicone 80 MG chewable tablet Commonly known as: MYLICON Chew 80 mg by mouth 3 (three) times daily as needed for flatulence.   traMADol 50 MG tablet Commonly known as: ULTRAM Take 1 tablet (50 mg total) by mouth every 6 (six) hours as needed for moderate pain.   venlafaxine XR 75 MG 24 hr capsule Commonly known as: EFFEXOR-XR Take 75 mg by mouth daily.        Follow-up Information     Wardell Honour, FNP. Schedule an appointment as soon as possible for a visit in 1 week(s).   Specialty: Family Medicine Contact information: [address] [place] [address] [phone]         Kennedy Bucker, MD. Schedule an  appointment as soon as possible for a visit in 3 week(s).   Specialty: Orthopedic Surgery Why: left distal fibular fracture f/u Contact information: [address] [place] [address] [phone]                Discharge Exam: Ceasar Mons Weights   10/08/21 2207 10/09/21 0254  Weight: 98.8 kg 95.4 kg     Condition at discharge: fair  The results of significant diagnostics from this hospitalization (including imaging, microbiology, ancillary and laboratory) are listed below for reference.   Imaging Studies: DG Ankle Complete Left  Result Date: 10/09/2021 CLINICAL DATA:  Known left fibular fracture, initial encounter EXAM: LEFT ANKLE COMPLETE - 3+ VIEW COMPARISON:  10/08/2021 FINDINGS: Oblique fracture of the distal fibula is again noted. Casting material is seen in place. The widening of the anterior aspect of the tibiotalar joint has been reduced. No other focal abnormality is noted. IMPRESSION: Stable distal fibular fracture. Reduction in widening of the tibiotalar space. Electronically Signed   By: Alcide Clever M.D.   On: 10/09/2021 21:00   DG Ankle Complete Right  Result Date: 10/09/2021 CLINICAL DATA:  Known left ankle fracture EXAM: RIGHT ANKLE - COMPLETE 3+ VIEW COMPARISON:  None Available. FINDINGS: Flattening of the plantar arch is noted. Tarsal degenerative changes are seen. No acute fracture or dislocation is noted. No soft tissue abnormality is seen. IMPRESSION: Degenerative changes without acute abnormality. Electronically Signed   By: Alcide Clever M.D.   On: 10/09/2021 21:00   CT Head Wo Contrast  Result Date: 10/09/2021 CLINICAL DATA:  Altered mental status. EXAM: CT HEAD WITHOUT CONTRAST TECHNIQUE: Contiguous axial images were obtained from the base of the skull through the vertex without intravenous contrast. RADIATION DOSE REDUCTION: This exam was performed according to the departmental dose-optimization program which includes  automated exposure control, adjustment of the mA and/or kV according to patient size and/or use of iterative reconstruction technique. COMPARISON:  Head CT dated 11/05/2020. FINDINGS: Brain: Mild age-related atrophy and chronic microvascular ischemic changes. There is no acute intracranial hemorrhage. No mass effect or midline shift. No extra-axial fluid collection. Vascular: No hyperdense vessel or unexpected calcification. Skull: Normal. Negative for fracture or focal lesion. Sinuses/Orbits: No acute finding. Other: None IMPRESSION: 1. No acute intracranial pathology. 2. Mild age-related atrophy and chronic microvascular ischemic changes. Electronically Signed   By: Elgie Collard M.D.   On: 10/09/2021 00:18   DG Chest Port 1 View  Result Date: 10/08/2021 CLINICAL DATA:  Altered mental status EXAM: PORTABLE CHEST 1 VIEW COMPARISON:  07/01/2021 FINDINGS: Cardiac shadow is enlarged but stable. Aortic calcifications are again seen. Implantable pacemaker is noted and stable. Mild increased central vascular congestion is noted. No sizable effusion or focal infiltrate is noted. IMPRESSION: Mild vascular congestion. Electronically Signed   By: Alcide Clever M.D.   On: 10/08/2021 23:30   DG Ankle Complete Left  Result Date: 10/08/2021 CLINICAL DATA:  Pain and swelling in the ankle, initial encounter EXAM: LEFT ANKLE COMPLETE - 3+ VIEW COMPARISON:  None Available. FINDINGS: Oblique fracture is noted in the distal diaphysis of the fibula. No definitive tibial fracture is seen. Mild soft tissue swelling is noted. Widening of the tibiotalar space is noted on the lateral projection. No other fractures are seen. Flattening of the plantar arch is noted. IMPRESSION: Oblique fracture through the distal fibular diaphysis is noted. Some widening of the tibiotalar space is noted on the lateral projection. Flattening of the plantar arch is seen. Electronically Signed   By: Alcide Clever M.D.   On: 10/08/2021 23:17     Microbiology: Results for orders placed or performed during the hospital encounter of 10/08/21  Culture, blood (routine x 2)     Status: None (Preliminary result)   Collection Time: 10/08/21 11:04 PM   Specimen: BLOOD LEFT FOREARM  Result Value Ref Range Status   Specimen Description BLOOD LEFT FOREARM  Final   Special Requests   Final    BOTTLES DRAWN AEROBIC AND ANAEROBIC Blood Culture adequate volume   Culture   Final    NO GROWTH 2 DAYS Performed at [facility], [address], [place], [place] [address]    Report Status PENDING  Incomplete  SARS Coronavirus 2 by RT PCR (hospital order, performed in [facility] hospital lab) *cepheid single result test* Urine, Clean Catch     Status: None   Collection Time: 10/08/21 11:04 PM   Specimen: Urine, Clean Catch; Nasal Swab  Result Value Ref Range Status   SARS Coronavirus 2 by RT PCR NEGATIVE NEGATIVE Final    Comment: (NOTE) SARS-CoV-2 target nucleic acids are NOT DETECTED.  The SARS-CoV-2 RNA is generally detectable in upper and lower respiratory specimens during the acute phase of infection. The lowest concentration of SARS-CoV-2 viral copies this assay can detect is 250 copies / mL. A negative result does not preclude SARS-CoV-2 infection and should not be used as the sole basis for treatment or other patient management decisions.  A negative result may occur with improper specimen collection / handling, submission of specimen other than nasopharyngeal swab, presence of viral mutation(s) within the areas targeted by this assay, and inadequate number of viral copies (<250 copies / mL). A negative result must be combined with clinical observations, patient history, and epidemiological information.  Fact Sheet for Patients:   RoadLapTop.co.za  Fact Sheet for Healthcare Providers: http://kim-miller.com/  This test is not yet approved or  cleared by the [place] FDA  and has been authorized for detection and/or diagnosis of SARS-CoV-2 by FDA under an Emergency Use Authorization (EUA).  This EUA will remain in effect (meaning this test can be used) for the duration of the COVID-19 declaration under Section 564(b)(1) of the Act, 21 U.S.C. section 360bbb-3(b)(1), unless the authorization is terminated or revoked sooner.  Performed at [facility], [address] [place], [place] [address]   Culture, blood (routine x 2)     Status: None (Preliminary result)   Collection Time: 10/09/21  3:03 AM   Specimen: BLOOD LEFT HAND  Result Value Ref Range Status   Specimen Description BLOOD LEFT HAND  Final   Special Requests   Final    BOTTLES DRAWN AEROBIC AND ANAEROBIC Blood Culture adequate volume   Culture   Final    NO GROWTH 1 DAY Performed at [facility], [address] [place], [place] [address]    Report Status PENDING  Incomplete    Labs: CBC: Recent Labs  Lab 10/08/21 2216 10/09/21 0303  WBC 8.1 7.9  NEUTROABS 5.8  --   HGB 9.8* 9.5*  HCT 32.4* 31.2*  MCV 76.2* 75.5*  PLT 285 255   Basic Metabolic Panel: Recent Labs  Lab 10/08/21 2216 10/09/21 0101 10/09/21 0303 10/09/21 0856 10/09/21 1244  NA 124* 123* 126* 128* 130*  K 4.7 4.8 4.5 4.3 4.7  CL 88* 88* 90* 93* 95*  CO2 28 27 28 26 29   GLUCOSE 100* 96 101* 95 99  BUN 12 12 13 12 12   CREATININE 1.24* 1.23* 1.21* 1.20* 1.22*  CALCIUM 9.2 9.2 9.4 9.2 9.1  MG  --  2.2  --   --   --    Liver Function Tests: Recent Labs  Lab 10/08/21 2216  AST 21  ALT 11  ALKPHOS 81  BILITOT 0.6  PROT 6.9  ALBUMIN 3.4*   CBG: No results for input(s): "GLUCAP" in the last 168 hours.  Discharge time spent: greater than 30 minutes.  Signed: , MD [facility] 10/10/2021

## 2021-10-11 LAB — URINE CULTURE: Culture: 80000 — AB

## 2021-10-13 LAB — CULTURE, BLOOD (ROUTINE X 2)
Culture: NO GROWTH
Special Requests: ADEQUATE

## 2021-10-14 LAB — CULTURE, BLOOD (ROUTINE X 2)
Culture: NO GROWTH
Special Requests: ADEQUATE

## 2021-10-25 ENCOUNTER — Emergency Department: Payer: Medicare Other

## 2021-10-25 ENCOUNTER — Inpatient Hospital Stay
Admission: EM | Admit: 2021-10-25 | Discharge: 2021-10-29 | DRG: 640 | Disposition: A | Discharge status: Skilled Nursing Facility | Payer: Medicare Other | Source: Skilled Nursing Facility | Attending: Internal Medicine | Admitting: Internal Medicine

## 2021-10-25 DIAGNOSIS — N183 Chronic kidney disease, stage 3 unspecified: Secondary | ICD-10-CM | POA: Diagnosis present

## 2021-10-25 DIAGNOSIS — E785 Hyperlipidemia, unspecified: Secondary | ICD-10-CM | POA: Diagnosis present

## 2021-10-25 DIAGNOSIS — K7460 Unspecified cirrhosis of liver: Secondary | ICD-10-CM | POA: Diagnosis present

## 2021-10-25 DIAGNOSIS — G9340 Encephalopathy, unspecified: Secondary | ICD-10-CM

## 2021-10-25 DIAGNOSIS — I482 Chronic atrial fibrillation, unspecified: Secondary | ICD-10-CM | POA: Diagnosis present

## 2021-10-25 DIAGNOSIS — Z91041 Radiographic dye allergy status: Secondary | ICD-10-CM | POA: Diagnosis not present

## 2021-10-25 DIAGNOSIS — F419 Anxiety disorder, unspecified: Secondary | ICD-10-CM | POA: Diagnosis present

## 2021-10-25 DIAGNOSIS — F319 Bipolar disorder, unspecified: Secondary | ICD-10-CM | POA: Diagnosis present

## 2021-10-25 DIAGNOSIS — F01518 Vascular dementia, unspecified severity, with other behavioral disturbance: Secondary | ICD-10-CM | POA: Diagnosis present

## 2021-10-25 DIAGNOSIS — I10 Essential (primary) hypertension: Secondary | ICD-10-CM | POA: Diagnosis present

## 2021-10-25 DIAGNOSIS — R269 Unspecified abnormalities of gait and mobility: Secondary | ICD-10-CM | POA: Insufficient documentation

## 2021-10-25 DIAGNOSIS — N1831 Chronic kidney disease, stage 3a: Secondary | ICD-10-CM | POA: Diagnosis present

## 2021-10-25 DIAGNOSIS — M2060 Acquired deformities of toe(s), unspecified, unspecified foot: Secondary | ICD-10-CM | POA: Insufficient documentation

## 2021-10-25 DIAGNOSIS — D649 Anemia, unspecified: Secondary | ICD-10-CM

## 2021-10-25 DIAGNOSIS — Z91013 Allergy to seafood: Secondary | ICD-10-CM | POA: Diagnosis not present

## 2021-10-25 DIAGNOSIS — E878 Other disorders of electrolyte and fluid balance, not elsewhere classified: Secondary | ICD-10-CM | POA: Diagnosis present

## 2021-10-25 DIAGNOSIS — S82832D Other fracture of upper and lower end of left fibula, subsequent encounter for closed fracture with routine healing: Secondary | ICD-10-CM | POA: Diagnosis not present

## 2021-10-25 DIAGNOSIS — R2990 Unspecified symptoms and signs involving the nervous system: Secondary | ICD-10-CM | POA: Diagnosis not present

## 2021-10-25 DIAGNOSIS — G9341 Metabolic encephalopathy: Secondary | ICD-10-CM | POA: Diagnosis present

## 2021-10-25 DIAGNOSIS — I129 Hypertensive chronic kidney disease with stage 1 through stage 4 chronic kidney disease, or unspecified chronic kidney disease: Secondary | ICD-10-CM | POA: Diagnosis present

## 2021-10-25 DIAGNOSIS — E871 Hypo-osmolality and hyponatremia: Secondary | ICD-10-CM | POA: Diagnosis present

## 2021-10-25 DIAGNOSIS — D509 Iron deficiency anemia, unspecified: Secondary | ICD-10-CM | POA: Diagnosis present

## 2021-10-25 DIAGNOSIS — Z79899 Other long term (current) drug therapy: Secondary | ICD-10-CM | POA: Diagnosis not present

## 2021-10-25 DIAGNOSIS — E669 Obesity, unspecified: Secondary | ICD-10-CM | POA: Diagnosis present

## 2021-10-25 DIAGNOSIS — Z888 Allergy status to other drugs, medicaments and biological substances status: Secondary | ICD-10-CM

## 2021-10-25 DIAGNOSIS — Z6832 Body mass index (BMI) 32.0-32.9, adult: Secondary | ICD-10-CM | POA: Diagnosis not present

## 2021-10-25 DIAGNOSIS — Z7901 Long term (current) use of anticoagulants: Secondary | ICD-10-CM | POA: Diagnosis not present

## 2021-10-25 DIAGNOSIS — S82839A Other fracture of upper and lower end of unspecified fibula, initial encounter for closed fracture: Secondary | ICD-10-CM

## 2021-10-25 DIAGNOSIS — F0153 Vascular dementia, unspecified severity, with mood disturbance: Secondary | ICD-10-CM | POA: Diagnosis present

## 2021-10-25 DIAGNOSIS — Z66 Do not resuscitate: Secondary | ICD-10-CM | POA: Diagnosis present

## 2021-10-25 DIAGNOSIS — S82832S Other fracture of upper and lower end of left fibula, sequela: Secondary | ICD-10-CM | POA: Diagnosis not present

## 2021-10-25 LAB — CBC WITH DIFFERENTIAL/PLATELET
Abs Immature Granulocytes: 0.03 10*3/uL (ref 0.00–0.07)
Basophils Absolute: 0 10*3/uL (ref 0.0–0.1)
Basophils Relative: 0 %
Eosinophils Absolute: 0.1 10*3/uL (ref 0.0–0.5)
Eosinophils Relative: 2 %
HCT: 33.4 % — ABNORMAL LOW (ref 36.0–46.0)
Hemoglobin: 9.9 g/dL — ABNORMAL LOW (ref 12.0–15.0)
Immature Granulocytes: 1 %
Lymphocytes Relative: 17 %
Lymphs Abs: 1.1 10*3/uL (ref 0.7–4.0)
MCH: 22.8 pg — ABNORMAL LOW (ref 26.0–34.0)
MCHC: 29.6 g/dL — ABNORMAL LOW (ref 30.0–36.0)
MCV: 77 fL — ABNORMAL LOW (ref 80.0–100.0)
Monocytes Absolute: 0.6 10*3/uL (ref 0.1–1.0)
Monocytes Relative: 10 %
Neutro Abs: 4.7 10*3/uL (ref 1.7–7.7)
Neutrophils Relative %: 70 %
Platelets: 330 10*3/uL (ref 150–400)
RBC: 4.34 MIL/uL (ref 3.87–5.11)
RDW: 16.5 % — ABNORMAL HIGH (ref 11.5–15.5)
WBC: 6.6 10*3/uL (ref 4.0–10.5)
nRBC: 0 % (ref 0.0–0.2)

## 2021-10-25 LAB — COMPREHENSIVE METABOLIC PANEL
ALT: 11 U/L (ref 0–44)
AST: 21 U/L (ref 15–41)
Albumin: 3.4 g/dL — ABNORMAL LOW (ref 3.5–5.0)
Alkaline Phosphatase: 89 U/L (ref 38–126)
Anion gap: 9 (ref 5–15)
BUN: 18 mg/dL (ref 8–23)
CO2: 21 mmol/L — ABNORMAL LOW (ref 22–32)
Calcium: 9.4 mg/dL (ref 8.9–10.3)
Chloride: 92 mmol/L — ABNORMAL LOW (ref 98–111)
Creatinine, Ser: 1.19 mg/dL — ABNORMAL HIGH (ref 0.44–1.00)
GFR, Estimated: 45 mL/min — ABNORMAL LOW (ref >60)
Glucose, Bld: 90 mg/dL (ref 70–99)
Potassium: 5 mmol/L (ref 3.5–5.1)
Sodium: 122 mmol/L — ABNORMAL LOW (ref 135–145)
Total Bilirubin: 0.5 mg/dL (ref 0.3–1.2)
Total Protein: 6.9 g/dL (ref 6.5–8.1)

## 2021-10-25 LAB — TROPONIN I (HIGH SENSITIVITY): Troponin I (High Sensitivity): 10 ng/L (ref <18)

## 2021-10-25 LAB — LIPASE, BLOOD: Lipase: 22 U/L (ref 11–51)

## 2021-10-25 MED ORDER — CYCLOSPORINE 0.05 % OP EMUL
1.0000 [drp] | Freq: Two times a day (BID) | OPHTHALMIC | Status: DC
Start: 2021-10-26 — End: 2021-10-29
  Administered 2021-10-26 – 2021-10-29 (×7): 1 [drp] via OPHTHALMIC
  Filled 2021-10-25 (×7): qty 30

## 2021-10-25 MED ORDER — ACETAMINOPHEN 325 MG PO TABS
650.0000 mg | ORAL_TABLET | Freq: Four times a day (QID) | ORAL | Status: DC | PRN
  Administered 2021-10-27: 650 mg via ORAL
  Filled 2021-10-25 (×2): qty 2

## 2021-10-25 MED ORDER — TRAMADOL HCL 50 MG PO TABS: 50.0000 mg | ORAL_TABLET | Freq: Four times a day (QID) | ORAL | Status: DC | PRN

## 2021-10-25 MED ORDER — MIDAZOLAM HCL 2 MG/2ML IJ SOLN
INTRAMUSCULAR | Status: AC
  Filled 2021-10-25: qty 2

## 2021-10-25 MED ORDER — SIMETHICONE 80 MG PO CHEW
80.0000 mg | CHEWABLE_TABLET | Freq: Three times a day (TID) | ORAL | Status: DC | PRN
Start: 2021-10-25 — End: 2021-10-29

## 2021-10-25 MED ORDER — MORPHINE SULFATE (PF) 4 MG/ML IV SOLN
4.0000 mg | Freq: Once | INTRAVENOUS | Status: AC
  Administered 2021-10-25: 4 mg via INTRAVENOUS
  Filled 2021-10-25: qty 1

## 2021-10-25 MED ORDER — HALOPERIDOL LACTATE 5 MG/ML IJ SOLN
2.0000 mg | Freq: Once | INTRAMUSCULAR | Status: DC
  Filled 2021-10-25: qty 1

## 2021-10-25 MED ORDER — CETIRIZINE HCL 10 MG PO TABS
10.0000 mg | ORAL_TABLET | Freq: Every day | ORAL | Status: DC
  Administered 2021-10-26 – 2021-10-29 (×4): 10 mg via ORAL
  Filled 2021-10-25 (×4): qty 1

## 2021-10-25 MED ORDER — HYDRALAZINE HCL 50 MG PO TABS
50.0000 mg | ORAL_TABLET | Freq: Three times a day (TID) | ORAL | Status: DC
  Administered 2021-10-26 – 2021-10-29 (×10): 50 mg via ORAL
  Filled 2021-10-25 (×11): qty 1

## 2021-10-25 MED ORDER — PANTOPRAZOLE SODIUM 40 MG PO TBEC
40.0000 mg | DELAYED_RELEASE_TABLET | Freq: Every day | ORAL | Status: DC
  Administered 2021-10-26 – 2021-10-29 (×4): 40 mg via ORAL
  Filled 2021-10-25 (×4): qty 1

## 2021-10-25 MED ORDER — ONDANSETRON HCL 4 MG/2ML IJ SOLN: 4.0000 mg | Freq: Four times a day (QID) | INTRAMUSCULAR | Status: DC | PRN

## 2021-10-25 MED ORDER — BISACODYL 10 MG RE SUPP: 10.0000 mg | Freq: Every day | RECTAL | Status: DC | PRN

## 2021-10-25 MED ORDER — METHOCARBAMOL 500 MG PO TABS
500.0000 mg | ORAL_TABLET | Freq: Three times a day (TID) | ORAL | Status: DC | PRN
  Filled 2021-10-25: qty 1

## 2021-10-25 MED ORDER — MORPHINE SULFATE (PF) 2 MG/ML IV SOLN
2.0000 mg | Freq: Once | INTRAVENOUS | Status: AC
  Administered 2021-10-25: 2 mg via INTRAVENOUS
  Filled 2021-10-25: qty 1

## 2021-10-25 MED ORDER — LOSARTAN POTASSIUM 50 MG PO TABS
100.0000 mg | ORAL_TABLET | Freq: Every day | ORAL | Status: DC
  Administered 2021-10-26 – 2021-10-28 (×3): 100 mg via ORAL
  Filled 2021-10-25 (×4): qty 2

## 2021-10-25 MED ORDER — ALUM & MAG HYDROXIDE-SIMETH 200-200-20 MG/5ML PO SUSP
15.0000 mL | Freq: Four times a day (QID) | ORAL | Status: DC | PRN
Start: 2021-10-25 — End: 2021-10-29

## 2021-10-25 MED ORDER — DIVALPROEX SODIUM 125 MG PO CSDR
125.0000 mg | DELAYED_RELEASE_CAPSULE | Freq: Two times a day (BID) | ORAL | Status: DC
  Administered 2021-10-26 – 2021-10-29 (×6): 125 mg via ORAL
  Filled 2021-10-25 (×7): qty 1

## 2021-10-25 MED ORDER — SODIUM CHLORIDE 0.9 % IV BOLUS
1000.0000 mL | Freq: Once | INTRAVENOUS | Status: AC
  Administered 2021-10-25: 1000 mL via INTRAVENOUS

## 2021-10-25 MED ORDER — FLUTICASONE PROPIONATE 50 MCG/ACT NA SUSP
1.0000 | Freq: Every day | NASAL | Status: DC
  Administered 2021-10-26 – 2021-10-28 (×3): 1 via NASAL
  Filled 2021-10-25: qty 16

## 2021-10-25 MED ORDER — CARVEDILOL 25 MG PO TABS
25.0000 mg | ORAL_TABLET | Freq: Two times a day (BID) | ORAL | Status: DC
  Administered 2021-10-26 – 2021-10-29 (×7): 25 mg via ORAL
  Filled 2021-10-25 (×7): qty 1

## 2021-10-25 MED ORDER — PRAZOSIN HCL 1 MG PO CAPS
1.0000 mg | ORAL_CAPSULE | Freq: Every day | ORAL | Status: DC
  Administered 2021-10-26 – 2021-10-27 (×3): 1 mg via ORAL
  Filled 2021-10-25 (×4): qty 1

## 2021-10-25 MED ORDER — ACETAMINOPHEN 650 MG RE SUPP: 650.0000 mg | Freq: Four times a day (QID) | RECTAL | Status: DC | PRN

## 2021-10-25 MED ORDER — SODIUM CHLORIDE 0.9 % IV SOLN: INTRAVENOUS | Status: DC

## 2021-10-25 MED ORDER — CLONIDINE HCL 0.3 MG/24HR TD PTWK
0.3000 mg | MEDICATED_PATCH | TRANSDERMAL | Status: DC
  Administered 2021-10-28: 0.3 mg via TRANSDERMAL
  Filled 2021-10-25: qty 1

## 2021-10-25 MED ORDER — VENLAFAXINE HCL ER 75 MG PO CP24
75.0000 mg | ORAL_CAPSULE | Freq: Every day | ORAL | Status: DC
  Administered 2021-10-26 – 2021-10-29 (×4): 75 mg via ORAL
  Filled 2021-10-25 (×4): qty 1

## 2021-10-25 MED ORDER — ONDANSETRON 4 MG PO TBDP: 4.0000 mg | ORAL_TABLET | Freq: Three times a day (TID) | ORAL | Status: DC | PRN

## 2021-10-25 MED ORDER — GABAPENTIN 100 MG PO CAPS
100.0000 mg | ORAL_CAPSULE | Freq: Three times a day (TID) | ORAL | Status: DC
  Administered 2021-10-26 – 2021-10-27 (×6): 100 mg via ORAL
  Filled 2021-10-25 (×6): qty 1

## 2021-10-25 MED ORDER — POLYETHYLENE GLYCOL 3350 17 G PO PACK
17.0000 g | PACK | Freq: Every day | ORAL | Status: DC
  Administered 2021-10-26 – 2021-10-28 (×3): 17 g via ORAL
  Filled 2021-10-25 (×4): qty 1

## 2021-10-25 MED ORDER — SENNOSIDES-DOCUSATE SODIUM 8.6-50 MG PO TABS
2.0000 | ORAL_TABLET | Freq: Two times a day (BID) | ORAL | Status: DC
  Administered 2021-10-26 – 2021-10-29 (×7): 2 via ORAL
  Filled 2021-10-25 (×8): qty 2

## 2021-10-25 MED ORDER — FLEET ENEMA 7-19 GM/118ML RE ENEM
1.0000 | ENEMA | Freq: Once | RECTAL | Status: AC
  Administered 2021-10-26: 1 via RECTAL

## 2021-10-25 MED ORDER — APIXABAN 2.5 MG PO TABS
2.5000 mg | ORAL_TABLET | Freq: Two times a day (BID) | ORAL | Status: DC
  Administered 2021-10-26 – 2021-10-29 (×7): 2.5 mg via ORAL
  Filled 2021-10-25 (×8): qty 1

## 2021-10-25 MED ORDER — ONDANSETRON HCL 4 MG PO TABS
4.0000 mg | ORAL_TABLET | Freq: Four times a day (QID) | ORAL | Status: DC | PRN
  Filled 2021-10-25: qty 1

## 2021-10-25 MED ORDER — TRAZODONE HCL 50 MG PO TABS
25.0000 mg | ORAL_TABLET | Freq: Every evening | ORAL | Status: DC | PRN
  Administered 2021-10-26 – 2021-10-27 (×2): 25 mg via ORAL
  Filled 2021-10-25 (×3): qty 1

## 2021-10-25 MED ORDER — POLYSACCHARIDE IRON COMPLEX 150 MG PO CAPS: 150.0000 mg | ORAL_CAPSULE | Freq: Every day | ORAL | Status: DC

## 2021-10-25 MED ORDER — MELATONIN 5 MG PO TABS
10.0000 mg | ORAL_TABLET | Freq: Every day | ORAL | Status: DC
  Administered 2021-10-26 – 2021-10-27 (×3): 10 mg via ORAL
  Filled 2021-10-25 (×4): qty 2

## 2021-10-25 MED ORDER — OLANZAPINE 5 MG PO TABS
2.5000 mg | ORAL_TABLET | Freq: Every day | ORAL | Status: DC
  Administered 2021-10-26 – 2021-10-27 (×3): 2.5 mg via ORAL
  Filled 2021-10-25 (×4): qty 0.5

## 2021-10-25 MED ORDER — OLOPATADINE HCL 0.1 % OP SOLN
1.0000 [drp] | Freq: Two times a day (BID) | OPHTHALMIC | Status: DC
Start: 2021-10-26 — End: 2021-10-29
  Administered 2021-10-26 – 2021-10-28 (×6): 1 [drp] via OPHTHALMIC
  Filled 2021-10-25: qty 5

## 2021-10-25 MED ORDER — MAGNESIUM HYDROXIDE 400 MG/5ML PO SUSP: 30.0000 mL | Freq: Every day | ORAL | Status: DC | PRN

## 2021-10-25 NOTE — H&P (Incomplete)
[place]   PATIENT NAME: Katrina Chen    MR#:  [id number]  DATE OF BIRTH:  October 31, 1935  DATE OF ADMISSION:  10/25/2021  PRIMARY CARE PHYSICIAN: Cephas Darby, FNP   Patient is coming from: Home  REQUESTING/REFERRING PHYSICIAN: Rada Hay, MD  CHIEF COMPLAINT:   Chief Complaint  Patient presents with  . Altered Mental Status    HISTORY OF PRESENT ILLNESS:  Katrina Chen is a 86 y.o. adult with medical history significant for atrial fibrillation on Eliquis, anxiety, depression, vascular dementia with behavioral changes, CKD, discharged hypertension and dyslipidemia, who presented to the emergency room with acute onset of altered mental status.  ED Course: Upon presentation to the emergency room, BP was 177/66 and respiratory rate was 24 with otherwise normal vital signs.  Labs revealed hyponatremia and hypochloremia with albumin of 3.4.  CBC showed anemia close to baseline.  Imaging: Abdominal and pelvic CT scan revealed the following: 1. Fecal impaction within the distal sigmoid colon and rectum. 2. Sigmoid diverticulosis. 3. Small bilateral pleural effusions. 4. 3.2 cm right adnexal simple-appearing cyst, not adequately characterized. Recommend prompt follow-up with pelvic US. Reference: JACR 2020 Feb;17(2):248-254 5. Postoperative changes throughout the lumbar spine. 6. Aortic atherosclerosis.  Noncontrast head CT scan revealed no acute intracranial abnormalities. Portable chest x-ray showed pulmonary hypoinflation.  The patient was given 1 L bolus of IV normal saline, Fleet enema, 2 mg and later 4 mg of IV morphine sulfate and 2 mg of IV Haldol for agitation.  She was given Versed for disimpaction.  She will be admitted to medical telemetry bed for further evaluation and management.  PAST MEDICAL HISTORY:   Past Medical History:  Diagnosis Date  . A-fib ([place])   . Anxiety   . Chronic kidney disease   . Depression   . Hyperlipidemia   . Hypertension    . Iron deficiency   . Vascular dementia ([place])     PAST SURGICAL HISTORY:   Past Surgical History:  Procedure Laterality Date  . ESOPHAGOGASTRODUODENOSCOPY (EGD) WITH PROPOFOL N/A 07/01/2021   Procedure: ESOPHAGOGASTRODUODENOSCOPY (EGD) WITH PROPOFOL;  Surgeon: Jonathon Bellows, MD;  Location: [facility] ENDOSCOPY;  Service: Gastroenterology;  Laterality: N/A;  . ESOPHAGOGASTRODUODENOSCOPY (EGD) WITH PROPOFOL N/A 07/27/2021   Procedure: ESOPHAGOGASTRODUODENOSCOPY (EGD) WITH PROPOFOL;  Surgeon: Lin Landsman, MD;  Location: [facility] ENDOSCOPY;  Service: Gastroenterology;  Laterality: N/A;  . PACEMAKER IMPLANT      SOCIAL HISTORY:   Social History   Tobacco Use  . Smoking status: Never  . Smokeless tobacco: Never  Substance Use Topics  . Alcohol use: Never    FAMILY HISTORY:  No family history on file.  DRUG ALLERGIES:   Allergies  Allergen Reactions  . Iodine Swelling  . Amlodipine Other (See Comments)  . Nifedipine Swelling and Other (See Comments)  . Iodinated Contrast Media   . Lisinopril Swelling  . Quinapril Swelling  . Shellfish Allergy   . Simvastatin Swelling    REVIEW OF SYSTEMS:   ROS As per history of present illness. All pertinent systems were reviewed above. Constitutional, HEENT, cardiovascular, respiratory, GI, GU, musculoskeletal, neuro, psychiatric, endocrine, integumentary and hematologic systems were reviewed and are otherwise negative/unremarkable except for positive findings mentioned above in the HPI.   MEDICATIONS AT HOME:   Prior to Admission medications   Medication Sig Start Date End Date Taking? Authorizing Provider  alum & mag hydroxide-simeth (ALUMINA-MAGNESIA-SIMETHICONE) [id number] MG/5ML suspension Take 15 mLs by mouth every 6 (six) hours as  needed for indigestion or heartburn.    [provider]  apixaban (ELIQUIS) 2.5 MG TABS tablet Take 2.5 mg by mouth 2 (two) times daily.    [provider]  bisacodyl (DULCOLAX) 10 MG  suppository Place 1 suppository (10 mg total) rectally daily as needed for mild constipation. 11/08/20   Tresa Moore, MD  carvedilol (COREG) 25 MG tablet Take 25 mg by mouth 2 (two) times daily with a meal.    [provider]  cloNIDine (CATAPRES - DOSED IN MG/24 HR) 0.3 mg/24hr patch Place 0.3 mg onto the skin every Friday.    [provider]  cycloSPORINE (RESTASIS) 0.05 % ophthalmic emulsion Place 1 drop into both eyes 2 (two) times daily. 05/26/21   [provider]  divalproex (DEPAKOTE SPRINKLE) 125 MG capsule Take 125 mg by mouth 2 (two) times daily. 02/18/21   [provider]  fluticasone (FLONASE) 50 MCG/ACT nasal spray Place 1 spray into both nostrils daily.    [provider]  gabapentin (NEURONTIN) 100 MG capsule Take 100 mg by mouth every 8 (eight) hours. 11/03/20   [provider]  hydrALAZINE (APRESOLINE) 100 MG tablet Take 25 mg by mouth every 8 (eight) hours.    [provider]  iron polysaccharides (NIFEREX) 150 MG capsule Take 150 mg by mouth daily.    [provider]  levocetirizine (XYZAL) 5 MG tablet Take 5 mg by mouth daily. 06/11/21   [provider]  losartan (COZAAR) 100 MG tablet Take 100 mg by mouth daily.    [provider]  melatonin 5 MG TABS Take 10 mg by mouth at bedtime.    [provider]  methocarbamol (ROBAXIN) 500 MG tablet Take 1 tablet (500 mg total) by mouth every 8 (eight) hours as needed for muscle spasms. 06/20/21   Sreenath, Sudheer B, MD  OLANZapine (ZYPREXA) 2.5 MG tablet Take 2.5 mg by mouth at bedtime.    [provider]  olopatadine (PATANOL) 0.1 % ophthalmic solution Place 1 drop into both eyes 2 (two) times daily.    [provider]  omeprazole (PRILOSEC) 20 MG capsule Take 20 mg by mouth daily.    [provider]  ondansetron (ZOFRAN-ODT) 4 MG disintegrating tablet Take 4 mg by mouth every 8 (eight) hours as needed for  vomiting or nausea.    [provider]  polyethylene glycol (MIRALAX / GLYCOLAX) 17 g packet Take 17 g by mouth 2 (two) times daily. Patient taking differently: Take 17 g by mouth daily. 11/16/20   Burnadette Pop, MD  prazosin (MINIPRESS) 1 MG capsule Take 1 mg by mouth at bedtime.    [provider]  senna-docusate (SENOKOT-S) 8.6-50 MG tablet Take 1 tablet by mouth 2 (two) times daily. Patient taking differently: Take 2 tablets by mouth 2 (two) times daily. 11/08/20   Tresa Moore, MD  simethicone (MYLICON) 80 MG chewable tablet Chew 80 mg by mouth 3 (three) times daily as needed for flatulence.    [provider]  traMADol (ULTRAM) 50 MG tablet Take 1 tablet (50 mg total) by mouth every 6 (six) hours as needed for moderate pain. 10/10/21   Enedina Finner, MD  venlafaxine XR (EFFEXOR-XR) 75 MG 24 hr capsule Take 75 mg by mouth daily. 06/20/21   [provider]      VITAL SIGNS:  Temperature (!) 97.3 F (36.3 C), temperature source Axillary.  PHYSICAL EXAMINATION:  Physical Exam  GENERAL:  86 y.o.-year-old patient lying  in the bed with no acute distress.  EYES: Pupils equal, round, reactive to light and accommodation. No scleral icterus. Extraocular muscles intact.  HEENT: Head atraumatic, normocephalic. Oropharynx and nasopharynx clear.  NECK:  Supple, no jugular venous distention. No thyroid enlargement, no tenderness.  LUNGS: Normal breath sounds bilaterally, no wheezing, rales,rhonchi or crepitation. No use of accessory muscles of respiration.  CARDIOVASCULAR: Regular rate and rhythm, S1, S2 normal. No murmurs, rubs, or gallops.  ABDOMEN: Soft, nondistended, nontender. Bowel sounds present. No organomegaly or mass.  EXTREMITIES: No pedal edema, cyanosis, or clubbing.  NEUROLOGIC: Cranial nerves II through XII are intact. Muscle strength 5/5 in all extremities. Sensation intact. Gait not checked.  PSYCHIATRIC: The patient is alert and oriented x 3.   Normal affect and good eye contact. SKIN: No obvious rash, lesion, or ulcer.   LABORATORY PANEL:   CBC Recent Labs  Lab 10/25/21 2026  WBC 6.6  HGB 9.9*  HCT 33.4*  PLT 330   ------------------------------------------------------------------------------------------------------------------  Chemistries  Recent Labs  Lab 10/25/21 2026  NA 122*  K 5.0  CL 92*  CO2 21*  GLUCOSE 90  BUN 18  CREATININE 1.19*  CALCIUM 9.4  AST 21  ALT 11  ALKPHOS 89  BILITOT 0.5   ------------------------------------------------------------------------------------------------------------------  Cardiac Enzymes No results for input(s): "TROPONINI" in the last 168 hours. ------------------------------------------------------------------------------------------------------------------  RADIOLOGY:  CT ABDOMEN PELVIS WO CONTRAST  Result Date: 10/25/2021 CLINICAL DATA:  Abdominal pain. EXAM: CT ABDOMEN AND PELVIS WITHOUT CONTRAST TECHNIQUE: Multidetector CT imaging of the abdomen and pelvis was performed following the standard protocol without IV contrast. RADIATION DOSE REDUCTION: This exam was performed according to the departmental dose-optimization program which includes automated exposure control, adjustment of the mA and/or kV according to patient size and/or use of iterative reconstruction technique. COMPARISON:  June 26, 2021 FINDINGS: Lower chest: Moderate severity atelectasis is seen within the posterior aspect of the bilateral lung bases. There are small bilateral pleural effusions. Hepatobiliary: No focal liver abnormality is seen. No gallstones, gallbladder wall thickening, or biliary dilatation. Pancreas: Unremarkable. No pancreatic ductal dilatation or surrounding inflammatory changes. Spleen: Normal in size without focal abnormality. Adrenals/Urinary Tract: Adrenal glands are unremarkable. Kidneys are normal, without renal calculi, focal lesion, or hydronephrosis. Bladder is  unremarkable. Stomach/Bowel: There is a small hiatal hernia. Appendix appears normal. A large amount of stool is seen within the distal sigmoid colon and rectum. No evidence of bowel dilatation. Noninflamed diverticula are seen throughout the sigmoid colon. A moderate amount of presacral inflammatory fat stranding is seen, posterior to the distal sigmoid colon. Vascular/Lymphatic: Aortic atherosclerosis. No enlarged abdominal or pelvic lymph nodes. Reproductive: Status post hysterectomy. A 3.2 cm x 2.6 cm cyst is seen along the anterior aspect of the right adnexa. Other: No abdominal wall hernia or abnormality. A very small amount of perihepatic fluid is seen. Musculoskeletal: Bilateral metallic density pedicle screws are seen at the levels of L3, L4, L5 and S1. Degenerative changes are noted throughout the remainder of the lumbar spine. IMPRESSION: 1. Fecal impaction within the distal sigmoid colon and rectum. 2. Sigmoid diverticulosis. 3. Small bilateral pleural effusions. 4. 3.2 cm right adnexal simple-appearing cyst, not adequately characterized. Recommend prompt follow-up with pelvic US. Reference: JACR 2020 Feb;17(2):248-254 5. Postoperative changes throughout the lumbar spine. 6. Aortic atherosclerosis. Aortic Atherosclerosis (ICD10-I70.0). Electronically Signed   By: Aram Candela M.D.   On: 10/25/2021 20:21   CT HEAD WO CONTRAST ( )  Result Date: 10/25/2021 CLINICAL DATA:  Altered mental status EXAM:  CT HEAD WITHOUT CONTRAST TECHNIQUE: Contiguous axial images were obtained from the base of the skull through the vertex without intravenous contrast. RADIATION DOSE REDUCTION: This exam was performed according to the departmental dose-optimization program which includes automated exposure control, adjustment of the mA and/or kV according to patient size and/or use of iterative reconstruction technique. COMPARISON:  10/09/2021 FINDINGS: Brain: No evidence of acute infarction, hemorrhage, mass, mass  effect, or midline shift. No hydrocephalus or extra-axial fluid collection. Vascular: No hyperdense vessel. Skull: Normal. Negative for fracture or focal lesion. Sinuses/Orbits: No acute finding. Status post bilateral lens replacements. Other: The mastoid air cells are well aerated. IMPRESSION: No acute intracranial process. No etiology seen for the patient's altered mental status. Electronically Signed   By: Merilyn Baba M.D.   On: 10/25/2021 20:20   DG Chest Portable 1 View  Result Date: 10/25/2021 CLINICAL DATA:  Dyspnea EXAM: PORTABLE CHEST 1 VIEW COMPARISON:  None Available. FINDINGS: Lungs volumes are small, but are symmetric and are clear. No pneumothorax or pleural effusion. Cardiac size within normal limits. Lead less pacemaker overlies the cardiac silhouette. Pulmonary vascularity is normal. Osseous structures are age-appropriate. No acute bone abnormality. IMPRESSION: Pulmonary hypoinflation. Electronically Signed   By: Fidela Salisbury M.D.   On: 10/25/2021 20:08      IMPRESSION AND PLAN:  Assessment and Plan: No notes have been filed under this hospital service. Service: Hospitalist      DVT prophylaxis: Lovenox***  Advanced Care Planning:  Code Status: full code***  Family Communication:  The plan of care was discussed in details with the patient (and family). I answered all questions. The patient agreed to proceed with the above mentioned plan. Further management will depend upon hospital course. Disposition Plan: Back to previous home environment Consults called: none***  All the records are reviewed and case discussed with ED provider.  Status is: Inpatient {Inpatient:23812}   At the time of the admission, it appears that the appropriate admission status for this patient is inpatient.  This is judged to be reasonable and necessary in order to provide the required intensity of service to ensure the patient's safety given the presenting symptoms, physical exam findings and  initial radiographic and laboratory data in the context of comorbid conditions.  The patient requires inpatient status due to high intensity of service, high risk of further deterioration and high frequency of surveillance required.  I certify that at the time of admission, it is my clinical judgment that the patient will require inpatient hospital care extending more than 2 midnights.                            Dispo: The patient is from: Home              Anticipated d/c is to: Home              Patient currently is not medically stable to d/c.              Difficult to place patient: No  Christel Mormon M.D on 10/25/2021 at 11:31 PM  [facility]   From 7 PM-7 AM, contact night-coverage [email or web address]  CC: Primary care physician; Cephas Darby, FNP

## 2021-10-25 NOTE — ED Provider Notes (Signed)
[facility] Provider Note    Event Date/Time   First MD Initiated Contact with Patient 10/25/21 1933     (approximate)   History   Altered Mental Status   HPI  Keris Ebanks is a 86 y.o. adult  with pmh atrial fibrillation, dementia, CKD presents with altered mental status.  Patient presents today because of altered mental status.  Currently she was at her baseline which is typically interactive and able to converse until today when she started yelling out and was agitated.  Patient is really not able to provide me much history at all she is not able to tell me she is in pain she is repeatedly yelling out help.     Past Medical History:  Diagnosis Date   A-fib [facility])    Anxiety    Chronic kidney disease    Depression    Hyperlipidemia    Hypertension    Iron deficiency    Vascular dementia [facility])     Patient Active Problem List   Diagnosis Date Noted   Acute metabolic encephalopathy [id number]   Vascular dementia with behavioral disturbance ([place]) 10/26/2021   Closed fracture of left distal fibula    Dementia ([place]) 10/09/2021   Fall 10/09/2021   Gastric atrophy    Gastric polyps    Acute UTI 06/16/2021   General weakness 06/16/2021   Pleural effusion w/ enlarged cardiac silhouette on CXR, trace edema 06/16/2021   Spinal stenosis of lumbar region 11/12/2020   Hyponatremia 11/12/2020   Nonspecific chest pain 11/05/2020   CAD (coronary artery disease) 11/05/2020   Hyperkalemia 11/05/2020   Obstipation 11/05/2020   History of bipolar disorder 11/05/2020   Atrial fibrillation, chronic ([place]) 11/05/2020   CKD (chronic kidney disease), stage III ([place]) 11/05/2020   Weakness 11/05/2020   Protein-calorie malnutrition ([place]) 11/05/2020   Disorder of refraction 06/05/2019   Dry eye syndrome of both eyes 06/05/2019   Posterior vitreous detachment of left eye 06/05/2019   Presence of intraocular lens 06/05/2019   Bipolar I disorder ([place]) 12/11/2013    Status post total bilateral knee replacement 05/27/2013   Previous back surgery 05/27/2013   Essential hypertension 04/22/2013   Knee pain, chronic 04/22/2013     Physical Exam  Triage Vital Signs: ED Triage Vitals [10/25/21 1945]  Enc Vitals Group     BP      Pulse      Resp      Temp (!) 97.3 F (36.3 C)     Temp Source Axillary     SpO2      Weight      Height      Head Circumference      Peak Flow      Pain Score      Pain Loc      Pain Edu?      Excl. in [place]?     Most recent vital signs: Vitals:   10/25/21 1945 10/25/21 2300  BP:  (!) 188/79  Pulse:  66  Resp:  16  Temp: (!) 97.3 F (36.3 C) 98 F (36.7 C)  SpO2:  100%     General: Awake, patient is distressed appearing yelling for help CV:  Good peripheral perfusion.  Pitting edema in the right lower extremity, left lower extremity with cast in place Resp:  Normal effort.  No increased work of breathing Abd:  No distention.  Abdomen is distended and tender to palpation throughout Neuro:  Patient is awake she is yelling she has somewhat increased tone in her bilateral upper extremities and has good strength and fights the exam, seems to have somewhat difficulty controlling her tongue which initially is protruding but then she is able to put it back in her mouth, face is symmetric pupils equal Other:  Stool ball on rectal exam   ED Results / Procedures / Treatments  Labs (all labs ordered are listed, but only abnormal results are displayed) Labs Reviewed  COMPREHENSIVE METABOLIC PANEL - Abnormal; Notable for the following components:      Result Value   Sodium 122 (*)    Chloride 92 (*)    CO2 21 (*)    Creatinine, Ser 1.19 (*)    Albumin 3.4 (*)    GFR, Estimated 45 (*)    All other components within normal limits  CBC WITH DIFFERENTIAL/PLATELET - Abnormal; Notable for the following components:   Hemoglobin 9.9 (*)    HCT 33.4 (*)    MCV 77.0 (*)    MCH 22.8 (*)    MCHC 29.6 (*)     RDW 16.5 (*)    All other components within normal limits  LIPASE, BLOOD  URINALYSIS, ROUTINE W REFLEX MICROSCOPIC  BASIC METABOLIC PANEL  CBC  TSH  OSMOLALITY, URINE  NA AND K (SODIUM & POTASSIUM), RAND UR  OSMOLALITY  TROPONIN I (HIGH SENSITIVITY)  TROPONIN I (HIGH SENSITIVITY)     EKG     RADIOLOGY .  I reviewed and interpreted the CT scan of the brain which does not show any acute intracranial process  PROCEDURES:  Critical Care performed: No  .1-3 Lead EKG Interpretation  Performed by: Georga Hacking, MD Authorized by: Georga Hacking, MD     Interpretation: normal     ECG rate assessment: normal     Rhythm: sinus rhythm     Ectopy: none     Conduction: normal     The patient is on the cardiac monitor to evaluate for evidence of arrhythmia and/or significant heart rate changes.   MEDICATIONS ORDERED IN ED: Medications  haloperidol lactate (HALDOL) injection 2 mg (2 mg Intravenous Not Given 10/25/21 2345)  sodium phosphate (FLEET) 7-19 GM/118ML enema 1 enema (has no administration in time range)  traMADol (ULTRAM) tablet 50 mg (has no administration in time range)  carvedilol (COREG) tablet 25 mg (has no administration in time range)  cloNIDine (CATAPRES - Dosed in mg/24 hr) patch 0.3 mg (has no administration in time range)  hydrALAZINE (APRESOLINE) tablet 50 mg (has no administration in time range)  losartan (COZAAR) tablet 100 mg (has no administration in time range)  prazosin (MINIPRESS) capsule 1 mg (has no administration in time range)  OLANZapine (ZYPREXA) tablet 2.5 mg (has no administration in time range)  venlafaxine XR (EFFEXOR-XR) 24 hr capsule 75 mg (has no administration in time range)  alum & mag hydroxide-simeth (MAALOX/MYLANTA) 200-200-20 MG/5ML suspension 15 mL (has no administration in time range)  bisacodyl (DULCOLAX) suppository 10 mg (has no administration in time range)  pantoprazole (PROTONIX) EC tablet 40 mg (has no  administration in time range)  ondansetron (ZOFRAN-ODT) disintegrating tablet 4 mg (has no administration in time range)  polyethylene glycol (MIRALAX / GLYCOLAX) packet 17 g (has no administration in time range)  senna-docusate (Senokot-S) tablet 2 tablet (has no administration in time range)  simethicone (MYLICON) chewable tablet 80 mg (has no administration in time range)  apixaban (ELIQUIS) tablet 2.5 mg (has no administration in  time range)  iron polysaccharides (NIFEREX) capsule 150 mg (has no administration in time range)  melatonin tablet 10 mg (has no administration in time range)  divalproex (DEPAKOTE SPRINKLE) capsule 125 mg (has no administration in time range)  gabapentin (NEURONTIN) capsule 100 mg (has no administration in time range)  methocarbamol (ROBAXIN) tablet 500 mg (has no administration in time range)  fluticasone (FLONASE) 50 MCG/ACT nasal spray 1 spray (has no administration in time range)  levocetirizine (XYZAL) tablet 5 mg (has no administration in time range)  cycloSPORINE (RESTASIS) 0.05 % ophthalmic emulsion 1 drop (has no administration in time range)  olopatadine (PATANOL) 0.1 % ophthalmic solution 1 drop (has no administration in time range)  0.9 %  sodium chloride infusion (has no administration in time range)  acetaminophen (TYLENOL) tablet 650 mg (has no administration in time range)    Or  acetaminophen (TYLENOL) suppository 650 mg (has no administration in time range)  traZODone (DESYREL) tablet 25 mg (has no administration in time range)  magnesium hydroxide (MILK OF MAGNESIA) suspension 30 mL (has no administration in time range)  ondansetron (ZOFRAN) tablet 4 mg (has no administration in time range)    Or  ondansetron (ZOFRAN) injection 4 mg (has no administration in time range)  morphine (PF) 2 MG/ML injection 2 mg (2 mg Intravenous Given 10/25/21 2037)  morphine (PF) 4 MG/ML injection 4 mg (4 mg Intravenous Given 10/25/21 2145)  sodium chloride 0.9 %  bolus 1,000 mL (1,000 mLs Intravenous New Bag/Given 10/25/21 2147)  midazolam (VERSED) 2 MG/2ML injection (  Given 10/25/21 2235)     IMPRESSION / MDM / ASSESSMENT AND PLAN / ED COURSE  I reviewed the triage vital signs and the nursing notes.                              Patient's presentation is most consistent with acute presentation with potential threat to life or bodily function.  Differential diagnosis includes, but is not limited to, acute abdomen, electrolyte abnormality, infection, intracranial mass, medication side effect, dystonic reaction  The patient is a 86 year old female with underlying dementia presenting with altered mental status.  Patient is quite agitated on arrival she is yelling for help seems distressed she fights the physical exam with her bilateral upper extremities which seems to have somewhat increased tone in but has good strength.  She does not like when I palpate her abdomen which seems somewhat distended.  She has a cast on the left lower extremity good perfusion to the right lower extremity with some edema.  Her tongue does protrude some but then she is able to put it back in her mouth does not look swollen to me.  CT head process.  CT abdomen pelvis shows fecal impaction which certainly could be driving some of her altered mental status.  I did perform manual disimpaction patient required some morphine and Versed tolerated this overall well.  Labs are notable for hyponatremia sodium 122.  Patient has been hyponatremic recently as well and is being fluid restricted at the facility.  Patient much calmer after Versed.  I have discussed with the hospitalist for admission for hyponatremia ongoing medical treatment of altered mental status.     FINAL CLINICAL IMPRESSION(S) / ED DIAGNOSES   Final diagnoses:  Hyponatremia  Acute encephalopathy     Rx / DC Orders   ED Discharge Orders     None  Note:  This document was prepared using Dragon voice  recognition software and may include unintentional dictation errors.   Rada Hay, MD 10/26/21 [id number]

## 2021-10-25 NOTE — ED Triage Notes (Signed)
Patient BIB EMS from [facility]. Per EMS patient has h/x of UTI. Nursing staff at [facility] state that she is altered, when asked if she is usually verbal EMS staff states" none of the night nursing home nurses know her, they are all new". Patient is not verbal , although she is  continuously  humming sound as if she is in pain. Patient is alert , unsure of orientation . Patient has h/x of afib , dementia, schixzoaffective disorder, and UTI. Patient placed on cardiac monitor, ekg complete. Molst form present .

## 2021-10-25 NOTE — H&P (Signed)
[place]   PATIENT NAME: Katrina Chen    MR#:  [id number]  DATE OF BIRTH:  December 07, 1935  DATE OF ADMISSION:  10/25/2021  PRIMARY CARE PHYSICIAN: Wardell Honour, FNP   Patient is coming from: Home  REQUESTING/REFERRING PHYSICIAN: Georga Hacking, MD  CHIEF COMPLAINT:   Chief Complaint  Patient presents with  . Altered Mental Status    HISTORY OF PRESENT ILLNESS:  Katrina Chen is a 86 y.o. adult with medical history significant for atrial fibrillation on Eliquis, anxiety, depression, vascular dementia with behavioral changes, discharged hypertension and dyslipidemia, who presented to the emergency room with acute onset of altered status ED Course: *** EKG as reviewed by me : *** Imaging: *** PAST MEDICAL HISTORY:   Past Medical History:  Diagnosis Date  . A-fib (HCC)   . Anxiety   . Chronic kidney disease   . Depression   . Hyperlipidemia   . Hypertension   . Iron deficiency   . Vascular dementia (HCC)     PAST SURGICAL HISTORY:   Past Surgical History:  Procedure Laterality Date  . ESOPHAGOGASTRODUODENOSCOPY (EGD) WITH PROPOFOL N/A 07/01/2021   Procedure: ESOPHAGOGASTRODUODENOSCOPY (EGD) WITH PROPOFOL;  Surgeon: Wyline Mood, MD;  Location: [facility] ENDOSCOPY;  Service: Gastroenterology;  Laterality: N/A;  . ESOPHAGOGASTRODUODENOSCOPY (EGD) WITH PROPOFOL N/A 07/27/2021   Procedure: ESOPHAGOGASTRODUODENOSCOPY (EGD) WITH PROPOFOL;  Surgeon: Toney Reil, MD;  Location: [facility] ENDOSCOPY;  Service: Gastroenterology;  Laterality: N/A;  . PACEMAKER IMPLANT      SOCIAL HISTORY:   Social History   Tobacco Use  . Smoking status: Never  . Smokeless tobacco: Never  Substance Use Topics  . Alcohol use: Never    FAMILY HISTORY:  No family history on file.  DRUG ALLERGIES:   Allergies  Allergen Reactions  . Iodine Swelling  . Amlodipine Other (See Comments)  . Nifedipine Swelling and Other (See Comments)  . Iodinated Contrast Media   . Lisinopril  Swelling  . Quinapril Swelling  . Shellfish Allergy   . Simvastatin Swelling    REVIEW OF SYSTEMS:   ROS As per history of present illness. All pertinent systems were reviewed above. Constitutional, HEENT, cardiovascular, respiratory, GI, GU, musculoskeletal, neuro, psychiatric, endocrine, integumentary and hematologic systems were reviewed and are otherwise negative/unremarkable except for positive findings mentioned above in the HPI.   MEDICATIONS AT HOME:   Prior to Admission medications   Medication Sig Start Date End Date Taking? Authorizing Provider  alum & mag hydroxide-simeth (ALUMINA-MAGNESIA-SIMETHICONE) 200-200-20 MG/5ML suspension Take 15 mLs by mouth every 6 (six) hours as needed for indigestion or heartburn.    [provider]  apixaban (ELIQUIS) 2.5 MG TABS tablet Take 2.5 mg by mouth 2 (two) times daily.    [provider]  bisacodyl (DULCOLAX) 10 MG suppository Place 1 suppository (10 mg total) rectally daily as needed for mild constipation. 11/08/20   Tresa Moore, MD  carvedilol (COREG) 25 MG tablet Take 25 mg by mouth 2 (two) times daily with a meal.    [provider]  cloNIDine (CATAPRES - DOSED IN MG/24 HR) 0.3 mg/24hr patch Place 0.3 mg onto the skin every Friday.    [provider]  cycloSPORINE (RESTASIS) 0.05 % ophthalmic emulsion Place 1 drop into both eyes 2 (two) times daily. 05/26/21   [provider]  divalproex (DEPAKOTE SPRINKLE) 125 MG capsule Take 125 mg by mouth 2 (two) times daily. 02/18/21   [provider]  fluticasone (FLONASE) 50  MCG/ACT nasal spray Place 1 spray into both nostrils daily.    [provider]  gabapentin (NEURONTIN) 100 MG capsule Take 100 mg by mouth every 8 (eight) hours. 11/03/20   [provider]  hydrALAZINE (APRESOLINE) 100 MG tablet Take 25 mg by mouth every 8 (eight) hours.    [provider]  iron polysaccharides (NIFEREX) 150 MG capsule Take  150 mg by mouth daily.    [provider]  levocetirizine (XYZAL) 5 MG tablet Take 5 mg by mouth daily. 06/11/21   [provider]  losartan (COZAAR) 100 MG tablet Take 100 mg by mouth daily.    [provider]  melatonin 5 MG TABS Take 10 mg by mouth at bedtime.    [provider]  methocarbamol (ROBAXIN) 500 MG tablet Take 1 tablet (500 mg total) by mouth every 8 (eight) hours as needed for muscle spasms. 06/20/21   Sreenath, Sudheer B, MD  OLANZapine (ZYPREXA) 2.5 MG tablet Take 2.5 mg by mouth at bedtime.    [provider]  olopatadine (PATANOL) 0.1 % ophthalmic solution Place 1 drop into both eyes 2 (two) times daily.    [provider]  omeprazole (PRILOSEC) 20 MG capsule Take 20 mg by mouth daily.    [provider]  ondansetron (ZOFRAN-ODT) 4 MG disintegrating tablet Take 4 mg by mouth every 8 (eight) hours as needed for vomiting or nausea.    [provider]  polyethylene glycol (MIRALAX / GLYCOLAX) 17 g packet Take 17 g by mouth 2 (two) times daily. Patient taking differently: Take 17 g by mouth daily. 11/16/20   Burnadette Pop, MD  prazosin (MINIPRESS) 1 MG capsule Take 1 mg by mouth at bedtime.    [provider]  senna-docusate (SENOKOT-S) 8.6-50 MG tablet Take 1 tablet by mouth 2 (two) times daily. Patient taking differently: Take 2 tablets by mouth 2 (two) times daily. 11/08/20   Tresa Moore, MD  simethicone (MYLICON) 80 MG chewable tablet Chew 80 mg by mouth 3 (three) times daily as needed for flatulence.    [provider]  traMADol (ULTRAM) 50 MG tablet Take 1 tablet (50 mg total) by mouth every 6 (six) hours as needed for moderate pain. 10/10/21   Enedina Finner, MD  venlafaxine XR (EFFEXOR-XR) 75 MG 24 hr capsule Take 75 mg by mouth daily. 06/20/21   [provider]      VITAL SIGNS:  Temperature (!) 97.3 F (36.3 C), temperature source Axillary.  PHYSICAL EXAMINATION:   Physical Exam  GENERAL:  86 y.o.-year-old patient lying in the bed with no acute distress.  EYES: Pupils equal, round, reactive to light and accommodation. No scleral icterus. Extraocular muscles intact.  HEENT: Head atraumatic, normocephalic. Oropharynx and nasopharynx clear.  NECK:  Supple, no jugular venous distention. No thyroid enlargement, no tenderness.  LUNGS: Normal breath sounds bilaterally, no wheezing, rales,rhonchi or crepitation. No use of accessory muscles of respiration.  CARDIOVASCULAR: Regular rate and rhythm, S1, S2 normal. No murmurs, rubs, or gallops.  ABDOMEN: Soft, nondistended, nontender. Bowel sounds present. No organomegaly or mass.  EXTREMITIES: No pedal edema, cyanosis, or clubbing.  NEUROLOGIC: Cranial nerves II through XII are intact. Muscle strength 5/5 in all extremities. Sensation intact. Gait not checked.  PSYCHIATRIC: The patient is alert and oriented x 3.  Normal affect and good eye contact. SKIN: No obvious rash, lesion, or ulcer.   LABORATORY PANEL:   CBC Recent Labs  Lab 10/25/21 2026  WBC 6.6  HGB 9.9*  HCT 33.4*  PLT 330   ------------------------------------------------------------------------------------------------------------------  Chemistries  Recent Labs  Lab 10/25/21 2026  NA 122*  K 5.0  CL 92*  CO2 21*  GLUCOSE 90  BUN 18  CREATININE 1.19*  CALCIUM 9.4  AST 21  ALT 11  ALKPHOS 89  BILITOT 0.5   ------------------------------------------------------------------------------------------------------------------  Cardiac Enzymes No results for input(s): "TROPONINI" in the last 168 hours. ------------------------------------------------------------------------------------------------------------------  RADIOLOGY:  CT ABDOMEN PELVIS WO CONTRAST  Result Date: 10/25/2021 CLINICAL DATA:  Abdominal pain. EXAM: CT ABDOMEN AND PELVIS WITHOUT CONTRAST TECHNIQUE: Multidetector CT imaging of the abdomen and pelvis was performed  following the standard protocol without IV contrast. RADIATION DOSE REDUCTION: This exam was performed according to the departmental dose-optimization program which includes automated exposure control, adjustment of the mA and/or kV according to patient size and/or use of iterative reconstruction technique. COMPARISON:  June 26, 2021 FINDINGS: Lower chest: Moderate severity atelectasis is seen within the posterior aspect of the bilateral lung bases. There are small bilateral pleural effusions. Hepatobiliary: No focal liver abnormality is seen. No gallstones, gallbladder wall thickening, or biliary dilatation. Pancreas: Unremarkable. No pancreatic ductal dilatation or surrounding inflammatory changes. Spleen: Normal in size without focal abnormality. Adrenals/Urinary Tract: Adrenal glands are unremarkable. Kidneys are normal, without renal calculi, focal lesion, or hydronephrosis. Bladder is unremarkable. Stomach/Bowel: There is a small hiatal hernia. Appendix appears normal. A large amount of stool is seen within the distal sigmoid colon and rectum. No evidence of bowel dilatation. Noninflamed diverticula are seen throughout the sigmoid colon. A moderate amount of presacral inflammatory fat stranding is seen, posterior to the distal sigmoid colon. Vascular/Lymphatic: Aortic atherosclerosis. No enlarged abdominal or pelvic lymph nodes. Reproductive: Status post hysterectomy. A 3.2 cm x 2.6 cm cyst is seen along the anterior aspect of the right adnexa. Other: No abdominal wall hernia or abnormality. A very small amount of perihepatic fluid is seen. Musculoskeletal: Bilateral metallic density pedicle screws are seen at the levels of L3, L4, L5 and S1. Degenerative changes are noted throughout the remainder of the lumbar spine. IMPRESSION: 1. Fecal impaction within the distal sigmoid colon and rectum. 2. Sigmoid diverticulosis. 3. Small bilateral pleural effusions. 4. 3.2 cm right adnexal simple-appearing cyst, not  adequately characterized. Recommend prompt follow-up with pelvic US. Reference: JACR 2020 Feb;17(2):248-254 5. Postoperative changes throughout the lumbar spine. 6. Aortic atherosclerosis. Aortic Atherosclerosis (ICD10-I70.0). Electronically Signed   By: Aram Candela M.D.   On: 10/25/2021 20:21   CT HEAD WO CONTRAST ( )  Result Date: 10/25/2021 CLINICAL DATA:  Altered mental status EXAM: CT HEAD WITHOUT CONTRAST TECHNIQUE: Contiguous axial images were obtained from the base of the skull through the vertex without intravenous contrast. RADIATION DOSE REDUCTION: This exam was performed according to the departmental dose-optimization program which includes automated exposure control, adjustment of the mA and/or kV according to patient size and/or use of iterative reconstruction technique. COMPARISON:  10/09/2021 FINDINGS: Brain: No evidence of acute infarction, hemorrhage, mass, mass effect, or midline shift. No hydrocephalus or extra-axial fluid collection. Vascular: No hyperdense vessel. Skull: Normal. Negative for fracture or focal lesion. Sinuses/Orbits: No acute finding. Status post bilateral lens replacements. Other: The mastoid air cells are well aerated. IMPRESSION: No acute intracranial process. No etiology seen for the patient's altered mental status. Electronically Signed   By: Wiliam Ke M.D.   On: 10/25/2021 20:20   DG Chest Portable 1 View  Result Date: 10/25/2021 CLINICAL DATA:  Dyspnea EXAM: PORTABLE CHEST 1 VIEW COMPARISON:  None Available. FINDINGS: Lungs volumes are small, but are symmetric and are clear. No pneumothorax or pleural effusion. Cardiac size within normal limits. Lead less pacemaker overlies the cardiac silhouette. Pulmonary vascularity is normal. Osseous structures are age-appropriate. No acute bone abnormality. IMPRESSION: Pulmonary hypoinflation. Electronically Signed   By: Helyn Numbers M.D.   On: 10/25/2021 20:08      IMPRESSION AND PLAN:  Assessment and  Plan: No notes have been filed under this hospital service. Service: Hospitalist      DVT prophylaxis: Lovenox***  Advanced Care Planning:  Code Status: full code***  Family Communication:  The plan of care was discussed in details with the patient (and family). I answered all questions. The patient agreed to proceed with the above mentioned plan. Further management will depend upon hospital course. Disposition Plan: Back to previous home environment Consults called: none***  All the records are reviewed and case discussed with ED provider.  Status is: Inpatient {Inpatient:23812}   At the time of the admission, it appears that the appropriate admission status for this patient is inpatient.  This is judged to be reasonable and necessary in order to provide the required intensity of service to ensure the patient's safety given the presenting symptoms, physical exam findings and initial radiographic and laboratory data in the context of comorbid conditions.  The patient requires inpatient status due to high intensity of service, high risk of further deterioration and high frequency of surveillance required.  I certify that at the time of admission, it is my clinical judgment that the patient will require inpatient hospital care extending more than 2 midnights.                            Dispo: The patient is from: Home              Anticipated d/c is to: Home              Patient currently is not medically stable to d/c.              Difficult to place patient: No  Hannah Beat M.D on 10/25/2021 at 11:31 PM  [facility]   From 7 PM-7 AM, contact night-coverage [email or web address]  CC: Primary care physician; Wardell Honour, FNP

## 2021-10-26 ENCOUNTER — Other Ambulatory Visit: Payer: Self-pay

## 2021-10-26 ENCOUNTER — Encounter: Payer: Self-pay | Admitting: Family Medicine

## 2021-10-26 ENCOUNTER — Inpatient Hospital Stay: Payer: Medicare Other

## 2021-10-26 DIAGNOSIS — E871 Hypo-osmolality and hyponatremia: Secondary | ICD-10-CM | POA: Diagnosis not present

## 2021-10-26 DIAGNOSIS — G9341 Metabolic encephalopathy: Secondary | ICD-10-CM | POA: Diagnosis not present

## 2021-10-26 DIAGNOSIS — F01518 Vascular dementia, unspecified severity, with other behavioral disturbance: Secondary | ICD-10-CM | POA: Diagnosis present

## 2021-10-26 DIAGNOSIS — K7460 Unspecified cirrhosis of liver: Secondary | ICD-10-CM

## 2021-10-26 DIAGNOSIS — E669 Obesity, unspecified: Secondary | ICD-10-CM

## 2021-10-26 DIAGNOSIS — I10 Essential (primary) hypertension: Secondary | ICD-10-CM | POA: Diagnosis not present

## 2021-10-26 DIAGNOSIS — D649 Anemia, unspecified: Secondary | ICD-10-CM

## 2021-10-26 DIAGNOSIS — I482 Chronic atrial fibrillation, unspecified: Secondary | ICD-10-CM | POA: Diagnosis not present

## 2021-10-26 LAB — URINALYSIS, ROUTINE W REFLEX MICROSCOPIC
Bilirubin Urine: NEGATIVE
Glucose, UA: NEGATIVE mg/dL
Ketones, ur: NEGATIVE mg/dL
Nitrite: NEGATIVE
Protein, ur: NEGATIVE mg/dL
Specific Gravity, Urine: 1.002 — ABNORMAL LOW (ref 1.005–1.030)
Squamous Epithelial / HPF: NONE SEEN (ref 0–5)
pH: 6 (ref 5.0–8.0)

## 2021-10-26 LAB — CBC
HCT: 29.8 % — ABNORMAL LOW (ref 36.0–46.0)
Hemoglobin: 8.8 g/dL — ABNORMAL LOW (ref 12.0–15.0)
MCH: 22.4 pg — ABNORMAL LOW (ref 26.0–34.0)
MCHC: 29.5 g/dL — ABNORMAL LOW (ref 30.0–36.0)
MCV: 76 fL — ABNORMAL LOW (ref 80.0–100.0)
Platelets: 274 10*3/uL (ref 150–400)
RBC: 3.92 MIL/uL (ref 3.87–5.11)
RDW: 16.3 % — ABNORMAL HIGH (ref 11.5–15.5)
WBC: 4.7 10*3/uL (ref 4.0–10.5)
nRBC: 0 % (ref 0.0–0.2)

## 2021-10-26 LAB — BASIC METABOLIC PANEL
Anion gap: 4 — ABNORMAL LOW (ref 5–15)
BUN: 17 mg/dL (ref 8–23)
CO2: 25 mmol/L (ref 22–32)
Calcium: 9.2 mg/dL (ref 8.9–10.3)
Chloride: 99 mmol/L (ref 98–111)
Creatinine, Ser: 1.15 mg/dL — ABNORMAL HIGH (ref 0.44–1.00)
GFR, Estimated: 47 mL/min — ABNORMAL LOW (ref >60)
Glucose, Bld: 88 mg/dL (ref 70–99)
Potassium: 4.8 mmol/L (ref 3.5–5.1)
Sodium: 128 mmol/L — ABNORMAL LOW (ref 135–145)

## 2021-10-26 LAB — NA AND K (SODIUM & POTASSIUM), RAND UR
Potassium Urine: 6 mmol/L
Sodium, Ur: 17 mmol/L

## 2021-10-26 LAB — TSH: TSH: 4.147 u[IU]/mL (ref 0.350–4.500)

## 2021-10-26 LAB — OSMOLALITY, URINE: Osmolality, Ur: 108 mOsm/kg — ABNORMAL LOW (ref 300–900)

## 2021-10-26 LAB — TROPONIN I (HIGH SENSITIVITY): Troponin I (High Sensitivity): 14 ng/L (ref <18)

## 2021-10-26 LAB — MRSA NEXT GEN BY PCR, NASAL: MRSA by PCR Next Gen: NOT DETECTED

## 2021-10-26 LAB — OSMOLALITY: Osmolality: 277 mOsm/kg (ref 275–295)

## 2021-10-26 MED ORDER — LABETALOL HCL 5 MG/ML IV SOLN
20.0000 mg | INTRAVENOUS | Status: DC | PRN
  Administered 2021-10-28: 20 mg via INTRAVENOUS
  Filled 2021-10-26 (×2): qty 4

## 2021-10-26 NOTE — Assessment & Plan Note (Addendum)
Mental status waxed and waned.  Repeat CT scan of the head negative.  ABG did not show any CO2 retention.  Discontinue gabapentin. Careful with Tramadol.  EEG negative.  Yesterday prior to discharge when EMS arrived the patient started grunting and got rigid with her arms.  We will try again for discharge today.

## 2021-10-26 NOTE — Assessment & Plan Note (Addendum)
Continue her Zyprexa. Zyprexa as needed for agitation.  Seen by psychiatry and no indication for psychiatric admission.

## 2021-10-26 NOTE — Assessment & Plan Note (Addendum)
Sodium came up from 122 to 128.  Yesterdays sodium is up at 136.  Urine osmolarity very low likely over drinking water.  Continue fluid restriction daily

## 2021-10-26 NOTE — Assessment & Plan Note (Signed)
BMI listed at 32.8

## 2021-10-26 NOTE — Assessment & Plan Note (Addendum)
Continue her Eliquis and Coreg.

## 2021-10-26 NOTE — Assessment & Plan Note (Addendum)
Patient on numerous antihypertensive medications.  Blood pressure very high this morning but came down after morning medications.  Can consider adding spironolactone as outpatient if blood pressure still remains elevated.  If this medication is started will have to follow-up potassium very closely.  Do not start hydrochlorothiazide because that can worsen sodium.

## 2021-10-26 NOTE — Progress Notes (Signed)
  Progress Note   Patient: Katrina Chen HUT:[id number] DOB: 06-27-1935 DOA: 10/25/2021     1 DOS: the patient was seen and examined on 10/26/2021   Assessment and Plan: * Hyponatremia Sodium came up from 122 to 128.  Will discontinue IV fluids since the patient is eating better.  Recheck sodium tomorrow.  With urine osmolarity being low and urine sodium being low this could be consistent with over drinking water.  Continue fluid restriction.  CT scan of the chest negative for lung mass.  Acute metabolic encephalopathy Likely related to low sodium.  Mental status better today than yesterday.  Atrial fibrillation, chronic (HCC) Continue her Eliquis and Coreg.  Essential hypertension Patient on numerous antihypertensive medications.  Cirrhosis (HCC) CT scan of the chest commented on the contour of the liver consistent with cirrhosis.  I will check hepatitis profiles.  And ammonia level in the morning.  Anemia, unspecified We will send off iron studies for further clarification.  Hemoglobin came down from 9.9 down to 8.8.  Obesity (BMI 30-39.9) BMI listed at 32.8  Vascular dementia with behavioral disturbance (HCC) - We will continue her Zyprexa.  Closed fracture of distal fibula Left ankle and leg in cast.  Patient nonweightbearing.  Bipolar I disorder (HCC) - We will continue her Zyprexa, Depakote and Effexor XR.        Subjective: Patient came in with altered mental status and found to have a very low sodium.  As per patient's daughter.  Mental status better today than yesterday.  Patient stated that she had a poor appetite recently.  She ate better this morning than she has been in a while.  Physical Exam: Vitals:   10/26/21 0800 10/26/21 1007 10/26/21 1314 10/26/21 1652  BP: (!) 134/48 (!) 151/68 (!) 130/46 (!) 147/51  Pulse:  70 60 (!) 59  Resp:   16 16  Temp: (!) 97.5 F (36.4 C)  98.6 F (37 C) 98 F (36.7 C)  TempSrc: Oral     SpO2: 100% 100% 100% 100%  Weight:       Height:       Physical Exam HENT:     Head: Normocephalic.     Mouth/Throat:     Pharynx: No oropharyngeal exudate.  Eyes:     General: Lids are normal.     Conjunctiva/sclera: Conjunctivae normal.  Cardiovascular:     Rate and Rhythm: Normal rate and regular rhythm.     Heart sounds: Normal heart sounds, S1 normal and S2 normal.  Pulmonary:     Breath sounds: No decreased breath sounds, wheezing, rhonchi or rales.  Abdominal:     Palpations: Abdomen is soft.  Musculoskeletal:     Right lower leg: No swelling.     Left lower leg: No swelling.     Comments: Left leg in a cast.  Skin:    General: Skin is warm.     Findings: No rash.  Neurological:     Mental Status: She is alert and oriented to person, place, and time.     Data Reviewed: Urine osmolality very low, urine sodium 17, hemoglobin 8.8, sodium 128, creatinine 1.15  Family Communication: Spoke with daughter at the bedside  Disposition: Status is: Inpatient Remains inpatient appropriate because: Sodium is still pretty low  Planned Discharge Destination: Long-term care    Time spent: 28 minutes  Author: Alford Highland, MD 10/26/2021 5:16 PM  For on call review [email or web address].

## 2021-10-26 NOTE — Assessment & Plan Note (Signed)
We will send off iron studies for further clarification.  Hemoglobin came down from 9.9 down to 8.8.

## 2021-10-26 NOTE — Assessment & Plan Note (Addendum)
CT scan of the chest commented on the contour of the liver consistent with cirrhosis.  Hepatitis profile is negative.  Ammonia level normal range.  We will check AMA and ANA -results pending.  Referred to gastroenterology as outpatient.Marland Kitchen

## 2021-10-26 NOTE — Assessment & Plan Note (Addendum)
Continue her Zyprexa, Depakote and Effexor XR.

## 2021-10-26 NOTE — Assessment & Plan Note (Signed)
Left ankle and leg in cast.  Patient nonweightbearing.

## 2021-10-27 ENCOUNTER — Inpatient Hospital Stay: Payer: Medicare Other

## 2021-10-27 DIAGNOSIS — G9341 Metabolic encephalopathy: Secondary | ICD-10-CM | POA: Diagnosis not present

## 2021-10-27 DIAGNOSIS — I10 Essential (primary) hypertension: Secondary | ICD-10-CM | POA: Diagnosis not present

## 2021-10-27 DIAGNOSIS — E871 Hypo-osmolality and hyponatremia: Secondary | ICD-10-CM | POA: Diagnosis not present

## 2021-10-27 DIAGNOSIS — S82832S Other fracture of upper and lower end of left fibula, sequela: Secondary | ICD-10-CM

## 2021-10-27 DIAGNOSIS — D509 Iron deficiency anemia, unspecified: Secondary | ICD-10-CM

## 2021-10-27 DIAGNOSIS — I482 Chronic atrial fibrillation, unspecified: Secondary | ICD-10-CM | POA: Diagnosis not present

## 2021-10-27 LAB — BASIC METABOLIC PANEL
Anion gap: 5 (ref 5–15)
BUN: 18 mg/dL (ref 8–23)
CO2: 26 mmol/L (ref 22–32)
Calcium: 9.5 mg/dL (ref 8.9–10.3)
Chloride: 103 mmol/L (ref 98–111)
Creatinine, Ser: 1.08 mg/dL — ABNORMAL HIGH (ref 0.44–1.00)
GFR, Estimated: 51 mL/min — ABNORMAL LOW (ref >60)
Glucose, Bld: 90 mg/dL (ref 70–99)
Potassium: 4.7 mmol/L (ref 3.5–5.1)
Sodium: 134 mmol/L — ABNORMAL LOW (ref 135–145)

## 2021-10-27 LAB — AMMONIA: Ammonia: 33 umol/L (ref 9–35)

## 2021-10-27 LAB — BLOOD GAS, ARTERIAL
Acid-Base Excess: 1.5 mmol/L (ref 0.0–2.0)
Bicarbonate: 27.2 mmol/L (ref 20.0–28.0)
O2 Saturation: 97.1 %
Patient temperature: 37
pCO2 arterial: 46 mmHg (ref 32–48)
pH, Arterial: 7.38 (ref 7.35–7.45)
pO2, Arterial: 75 mmHg — ABNORMAL LOW (ref 83–108)

## 2021-10-27 LAB — IRON AND TIBC
Iron: 42 ug/dL (ref 28–170)
Saturation Ratios: 13 % (ref 10.4–31.8)
TIBC: 314 ug/dL (ref 250–450)
UIBC: 272 ug/dL

## 2021-10-27 LAB — HEPATITIS C ANTIBODY: HCV Ab: NONREACTIVE

## 2021-10-27 LAB — HEPATITIS B SURFACE ANTIGEN: Hepatitis B Surface Ag: NONREACTIVE

## 2021-10-27 LAB — HEPATITIS B CORE ANTIBODY, TOTAL: Hep B Core Total Ab: NONREACTIVE

## 2021-10-27 LAB — HEMOGLOBIN: Hemoglobin: 8.9 g/dL — ABNORMAL LOW (ref 12.0–15.0)

## 2021-10-27 LAB — FERRITIN: Ferritin: 59 ng/mL (ref 11–307)

## 2021-10-27 MED ORDER — TRAMADOL HCL 50 MG PO TABS
50.0000 mg | ORAL_TABLET | Freq: Two times a day (BID) | ORAL | Status: DC | PRN
Start: 2021-10-27 — End: 2021-10-27

## 2021-10-27 MED ORDER — FLEET ENEMA 7-19 GM/118ML RE ENEM
1.0000 | ENEMA | Freq: Every day | RECTAL | Status: DC | PRN
Start: 2021-10-27 — End: 2021-10-29

## 2021-10-27 MED ORDER — BISACODYL 10 MG RE SUPP
10.0000 mg | Freq: Once | RECTAL | Status: DC
Start: 2021-10-27 — End: 2021-10-29

## 2021-10-27 MED ORDER — FERROUS SULFATE 325 (65 FE) MG PO TABS
325.0000 mg | ORAL_TABLET | Freq: Every day | ORAL | Status: DC
Start: 2021-10-28 — End: 2021-10-29
  Administered 2021-10-28 – 2021-10-29 (×2): 325 mg via ORAL
  Filled 2021-10-27 (×2): qty 1

## 2021-10-27 MED ORDER — DOCUSATE SODIUM 100 MG PO CAPS
200.0000 mg | ORAL_CAPSULE | Freq: Two times a day (BID) | ORAL | Status: DC
  Administered 2021-10-27 – 2021-10-28 (×2): 200 mg via ORAL
  Filled 2021-10-27 (×4): qty 2

## 2021-10-27 NOTE — Assessment & Plan Note (Addendum)
Yesterday's hemoglobin 8.9.  Start low-dose iron.

## 2021-10-27 NOTE — Progress Notes (Signed)
Progress Note   Patient: Katrina Chen GBT:[id number] DOB: 09/18/1935 DOA: 10/25/2021     2 DOS: the patient was seen and examined on 10/27/2021     Assessment and Plan: * Acute metabolic encephalopathy Mental status waxed and waned today.  Repeat CT scan of the head negative.  ABG did not show any CO2 retention.  Discontinue gabapentin and tramadol for now.  Hyponatremia Sodium came up from 122 to 128.  Today's sodium is up at 134.  Urine osmolarity very low likely over drinking water.  Continue fluid restriction.  Atrial fibrillation, chronic (HCC) Continue her Eliquis and Coreg.  Essential hypertension Patient on numerous antihypertensive medications.  Iron deficiency anemia Today's hemoglobin 8.9.  Start low-dose iron.  Cirrhosis (HCC) CT scan of the chest commented on the contour of the liver consistent with cirrhosis.  Hepatitis profile is negative.  Ammonia level normal range.  We will check ANCA and ANA tomorrow morning.  Obesity (BMI 30-39.9) BMI listed at 32.8  Vascular dementia with behavioral disturbance (HCC) - We will continue her Zyprexa.  Closed fracture of distal fibula Left ankle and leg in cast.  Patient nonweightbearing.  Bipolar I disorder (HCC) - We will continue her Zyprexa, Depakote and Effexor XR.        Subjective: Patient seen 3 times today.  1 time I had to sternal rub her to wake her up.  She answered some questions.  Earlier this morning she said she wanted to have a bowel movement which we achieved.  Patient eating better today.  Physical Exam: Vitals:   10/27/21 0857 10/27/21 0900 10/27/21 0907 10/27/21 1128  BP: (!) 159/84 (!) 156/60 (!) 156/60 (!) 151/68  Pulse: (!) 59 66 61 (!) 57  Resp:    16  Temp:  98.6 F (37 C)  98.7 F (37.1 C)  TempSrc:  Oral    SpO2: 99% 96% 98% 100%  Weight:      Height:       Physical Exam HENT:     Head: Normocephalic.     Mouth/Throat:     Pharynx: No oropharyngeal exudate.  Eyes:     General:  Lids are normal.     Conjunctiva/sclera: Conjunctivae normal.  Cardiovascular:     Rate and Rhythm: Normal rate and regular rhythm.     Heart sounds: Normal heart sounds, S1 normal and S2 normal.  Pulmonary:     Breath sounds: No decreased breath sounds, wheezing, rhonchi or rales.  Abdominal:     Palpations: Abdomen is soft.  Musculoskeletal:     Right lower leg: No swelling.     Left lower leg: No swelling.     Comments: Left leg in a cast.  Skin:    General: Skin is warm.     Findings: No rash.  Neurological:     Mental Status: She is lethargic.     Comments: At the sternal rub to wake her up     Data Reviewed: ABG shows a pH of 7.3 8/46/75 Sodium 134, creatinine 1.08, ferritin 59  Family Communication: Spoke with daughter at the bedside  Disposition: Status is: Inpatient Remains inpatient appropriate because: Needed to repeat CT scan of the head.  We will hold gabapentin and tramadol which could cause altered mental status.  We will watch 1 more day and reassess tomorrow.  Check Depakote level tomorrow.  Planned Discharge Destination: Long-term care    Time spent: 29 minutes  Author: Alford Highland, MD 10/27/2021 3:46 PM  For  on call review [email or web address].

## 2021-10-28 DIAGNOSIS — I10 Essential (primary) hypertension: Secondary | ICD-10-CM | POA: Diagnosis not present

## 2021-10-28 DIAGNOSIS — F319 Bipolar disorder, unspecified: Secondary | ICD-10-CM | POA: Diagnosis not present

## 2021-10-28 DIAGNOSIS — F01518 Vascular dementia, unspecified severity, with other behavioral disturbance: Secondary | ICD-10-CM | POA: Diagnosis not present

## 2021-10-28 DIAGNOSIS — E871 Hypo-osmolality and hyponatremia: Secondary | ICD-10-CM | POA: Diagnosis not present

## 2021-10-28 DIAGNOSIS — G9341 Metabolic encephalopathy: Secondary | ICD-10-CM | POA: Diagnosis not present

## 2021-10-28 DIAGNOSIS — I482 Chronic atrial fibrillation, unspecified: Secondary | ICD-10-CM | POA: Diagnosis not present

## 2021-10-28 LAB — BASIC METABOLIC PANEL
Anion gap: 6 (ref 5–15)
BUN: 16 mg/dL (ref 8–23)
CO2: 25 mmol/L (ref 22–32)
Calcium: 9.6 mg/dL (ref 8.9–10.3)
Chloride: 105 mmol/L (ref 98–111)
Creatinine, Ser: 1.02 mg/dL — ABNORMAL HIGH (ref 0.44–1.00)
GFR, Estimated: 54 mL/min — ABNORMAL LOW (ref >60)
Glucose, Bld: 90 mg/dL (ref 70–99)
Potassium: 4.7 mmol/L (ref 3.5–5.1)
Sodium: 136 mmol/L (ref 135–145)

## 2021-10-28 LAB — VALPROIC ACID LEVEL: Valproic Acid Lvl: 26 ug/mL — ABNORMAL LOW (ref 50.0–100.0)

## 2021-10-28 LAB — HEPATITIS B SURFACE ANTIBODY, QUANTITATIVE: Hep B S AB Quant (Post): 3.6 m[IU]/mL — ABNORMAL LOW

## 2021-10-28 MED ORDER — SPIRONOLACTONE 12.5 MG HALF TABLET
12.5000 mg | ORAL_TABLET | Freq: Every day | ORAL | Status: DC
  Administered 2021-10-28 – 2021-10-29 (×2): 12.5 mg via ORAL
  Filled 2021-10-28: qty 0.5
  Filled 2021-10-28 (×2): qty 1

## 2021-10-28 MED ORDER — TRAMADOL HCL 50 MG PO TABS: 50.0000 mg | ORAL_TABLET | Freq: Two times a day (BID) | ORAL | 0 refills | Status: DC | PRN

## 2021-10-28 MED ORDER — OLANZAPINE 2.5 MG PO TABS
2.5000 mg | ORAL_TABLET | Freq: Every day | ORAL | Status: DC | PRN
  Filled 2021-10-28 (×2): qty 1

## 2021-10-28 MED ORDER — OLANZAPINE 5 MG PO TBDP
2.5000 mg | ORAL_TABLET | Freq: Once | ORAL | Status: AC
  Administered 2021-10-28: 2.5 mg via ORAL
  Filled 2021-10-28: qty 0.5

## 2021-10-28 MED ORDER — FUROSEMIDE 20 MG PO TABS: 20.0000 mg | ORAL_TABLET | Freq: Every day | ORAL | Status: DC

## 2021-10-28 MED ORDER — HYDRALAZINE HCL 50 MG PO TABS: 50.0000 mg | ORAL_TABLET | Freq: Three times a day (TID) | ORAL | 0 refills | Status: AC

## 2021-10-28 NOTE — NC FL2 (Signed)
[place] LEVEL OF CARE SCREENING TOOL     IDENTIFICATION  Patient Name: Katrina Chen Birthdate: 01-Sep-1935 Sex: adult Admission Date (Current Location): 10/25/2021  [facility] and [place] Number:  Engineering geologist and Address:  [facility], [address] [place], [place] [address]      Provider Number: [id number]  Attending Physician Name and Address:  Loletha Grayer, MD  Relative Name and Phone Number:  Caren Griffins (daughter) [phone]    Current Level of Care: Hospital Recommended Level of Care: [place] Prior Approval Number:    Date Approved/Denied:   PASRR Number: [id number]  Discharge Plan: SNF    Current Diagnoses: Patient Active Problem List   Diagnosis Date Noted   Iron deficiency anemia 123456   Acute metabolic encephalopathy 123456   Vascular dementia with behavioral disturbance ([place]) 10/26/2021   Obesity (BMI 30-39.9) 10/26/2021   Cirrhosis ([place]) 10/26/2021   Closed fracture of distal fibula    Dementia ([place]) 10/09/2021   Fall 10/09/2021   Gastric atrophy    Gastric polyps    Acute UTI 06/16/2021   General weakness 06/16/2021   Pleural effusion w/ enlarged cardiac silhouette on CXR, trace edema 06/16/2021   Spinal stenosis of lumbar region 11/12/2020   Hyponatremia 11/12/2020   Nonspecific chest pain 11/05/2020   CAD (coronary artery disease) 11/05/2020   Hyperkalemia 11/05/2020   Obstipation 11/05/2020   History of bipolar disorder 11/05/2020   Atrial fibrillation, chronic ([place]) 11/05/2020   CKD (chronic kidney disease), stage III ([place]) 11/05/2020   Weakness 11/05/2020   Protein-calorie malnutrition ([place]) 11/05/2020   Disorder of refraction 06/05/2019   Dry eye syndrome of both eyes 06/05/2019   Posterior vitreous detachment of left eye 06/05/2019   Presence of intraocular lens 06/05/2019   Bipolar I disorder ([place]) 12/11/2013   Status post total bilateral knee replacement  05/27/2013   Previous back surgery 05/27/2013   Essential hypertension 04/22/2013   Knee pain, chronic 04/22/2013    Orientation RESPIRATION BLADDER Height & Weight     Self, Place, Time  Normal Incontinent, External catheter Weight: 209 lb 7 oz (95 kg) Height:  5\' 7"  (170.2 cm)  BEHAVIORAL SYMPTOMS/MOOD NEUROLOGICAL BOWEL NUTRITION STATUS      Incontinent Diet (see discharge summary)  AMBULATORY STATUS COMMUNICATION OF NEEDS Skin   Extensive Assist Verbally Normal                       Personal Care Assistance Level of Assistance  Bathing, Feeding, Dressing, Total care Bathing Assistance: Maximum assistance Feeding assistance: Maximum assistance Dressing Assistance: Maximum assistance Total Care Assistance: Maximum assistance   Functional Limitations Info  Sight, Hearing, Speech Sight Info: Impaired Hearing Info: Adequate Speech Info: Adequate    SPECIAL CARE FACTORS FREQUENCY                       Contractures Contractures Info: Not present    Additional Factors Info  Code Status, Allergies Code Status Info: dnr Allergies Info: Iodine   Amlodipine   Nifedipine   Iodinated Contrast Media   Lisinopril   Quinapril   Shellfish Allergy   Simvastatin           Current Medications (10/28/2021):  This is the current hospital active medication list Current Facility-Administered Medications  Medication Dose Route Frequency Provider Last Rate Last Admin   acetaminophen (TYLENOL) tablet 650 mg  650 mg Oral Q6H PRN Mansy, Arvella Merles, MD  650 mg at 10/27/21 2138   Or   acetaminophen (TYLENOL) suppository 650 mg  650 mg Rectal Q6H PRN Mansy, Jan A, MD       alum & mag hydroxide-simeth (MAALOX/MYLANTA) 200-200-20 MG/5ML suspension 15 mL  15 mL Oral Q6H PRN Mansy, Jan A, MD       apixaban Everlene Balls) tablet 2.5 mg  2.5 mg Oral BID Mansy, Jan A, MD   2.5 mg at 10/28/21 8756   bisacodyl (DULCOLAX) suppository 10 mg  10 mg Rectal Daily PRN Mansy, Jan A, MD       bisacodyl  (DULCOLAX) suppository 10 mg  10 mg Rectal Once Alford Highland, MD       carvedilol (COREG) tablet 25 mg  25 mg Oral BID WC Mansy, Jan A, MD   25 mg at 10/28/21 0839   cetirizine (ZYRTEC) tablet 10 mg  10 mg Oral Daily Mansy, Jan A, MD   10 mg at 10/28/21 0840   cloNIDine (CATAPRES - Dosed in mg/24 hr) patch 0.3 mg  0.3 mg Transdermal Q Fri Mansy, Jan A, MD   0.3 mg at 10/28/21 0841   cycloSPORINE (RESTASIS) 0.05 % ophthalmic emulsion 1 drop  1 drop Both Eyes BID Mansy, Jan A, MD   1 drop at 10/28/21 0841   divalproex (DEPAKOTE SPRINKLE) capsule 125 mg  125 mg Oral BID Mansy, Jan A, MD   125 mg at 10/28/21 0840   docusate sodium (COLACE) capsule 200 mg  200 mg Oral BID Alford Highland, MD   200 mg at 10/28/21 0839   ferrous sulfate tablet 325 mg  325 mg Oral Q breakfast Alford Highland, MD   325 mg at 10/28/21 0839   fluticasone (FLONASE) 50 MCG/ACT nasal spray 1 spray  1 spray Each Nare Daily Mansy, Jan A, MD   1 spray at 10/28/21 0841   hydrALAZINE (APRESOLINE) tablet 50 mg  50 mg Oral Q8H Mansy, Jan A, MD   50 mg at 10/28/21 0540   labetalol (NORMODYNE) injection 20 mg  20 mg Intravenous Q3H PRN Mansy, Jan A, MD   20 mg at 10/28/21 0540   losartan (COZAAR) tablet 100 mg  100 mg Oral Daily Mansy, Jan A, MD   100 mg at 10/28/21 0839   magnesium hydroxide (MILK OF MAGNESIA) suspension 30 mL  30 mL Oral Daily PRN Mansy, Jan A, MD       melatonin tablet 10 mg  10 mg Oral QHS Mansy, Jan A, MD   10 mg at 10/27/21 2139   methocarbamol (ROBAXIN) tablet 500 mg  500 mg Oral Q8H PRN Mansy, Jan A, MD       OLANZapine (ZYPREXA) tablet 2.5 mg  2.5 mg Oral QHS Mansy, Jan A, MD   2.5 mg at 10/27/21 2140   olopatadine (PATANOL) 0.1 % ophthalmic solution 1 drop  1 drop Both Eyes BID Mansy, Jan A, MD   1 drop at 10/28/21 0842   ondansetron (ZOFRAN) tablet 4 mg  4 mg Oral Q6H PRN Mansy, Jan A, MD       Or   ondansetron [facility]) injection 4 mg  4 mg Intravenous Q6H PRN Mansy, Jan A, MD       pantoprazole  (PROTONIX) EC tablet 40 mg  40 mg Oral Daily Mansy, Jan A, MD   40 mg at 10/28/21 0839   polyethylene glycol (MIRALAX / GLYCOLAX) packet 17 g  17 g Oral Daily Mansy, Jan A, MD   17 g at  10/28/21 0839   prazosin (MINIPRESS) capsule 1 mg  1 mg Oral QHS Mansy, Jan A, MD   1 mg at 10/27/21 2139   senna-docusate (Senokot-S) tablet 2 tablet  2 tablet Oral BID Mansy, Jan A, MD   2 tablet at 10/28/21 0839   simethicone (MYLICON) chewable tablet 80 mg  80 mg Oral TID PRN Mansy, Jan A, MD       sodium phosphate (FLEET) 7-19 GM/118ML enema 1 enema  1 enema Rectal Daily PRN Alford Highland, MD       spironolactone (ALDACTONE) tablet 12.5 mg  12.5 mg Oral Daily Alford Highland, MD   12.5 mg at 10/28/21 0840   traZODone (DESYREL) tablet 25 mg  25 mg Oral QHS PRN Mansy, Jan A, MD   25 mg at 10/27/21 2139   venlafaxine XR (EFFEXOR-XR) 24 hr capsule 75 mg  75 mg Oral Daily Mansy, Jan A, MD   75 mg at 10/28/21 2248     Discharge Medications: Please see discharge summary for a list of discharge medications.  Relevant Imaging Results:  Relevant Lab Results:   Additional Information SSN:[id number]  Gildardo Griffes, LCSW

## 2021-10-28 NOTE — Care Management Important Message (Signed)
Important Message  Patient Details  Name: Katrina Chen MRN: [id number] Date of Birth: [birth year]   Medicare Important Message Given:  N/A - LOS <3 / Initial given by admissions     [name] A [name] 10/28/2021, 10:53 AM

## 2021-10-28 NOTE — Progress Notes (Signed)
Cancelling discharge.  Patient acting out and grunting and rigid with her upper extremities.  She does not want to go back to her facility.  Will get psych consult and EEG.  Dr Renae Gloss

## 2021-10-28 NOTE — Consult Note (Signed)
[facility] Face-to-Face Psychiatry Consult   Reason for Consult:  acting out prior to discharge Referring Physician:  Dr Renae Gloss Patient Identification: Katrina Chen MRN:  [id number] Principal Diagnosis: Acute metabolic encephalopathy Diagnosis:  Principal Problem:   Acute metabolic encephalopathy Active Problems:   Vascular dementia with behavioral disturbance (HCC)   Atrial fibrillation, chronic (HCC)   Essential hypertension   Hyponatremia   Bipolar I disorder (HCC)   Closed fracture of distal fibula   Obesity (BMI 30-39.9)   Cirrhosis (HCC)   Iron deficiency anemia   Total Time spent with patient: 45 minutes  Subjective:   Katrina Chen is a 86 y.o. adult patient admitted with AMS, discharged today and acted out when transport came.  HPI:  86 yo female with a history of bipolar d/o and dementia.  She was in the process of discharging today and acted out, psych consult placed.  On assessment, she was calmly eating her lunch.  She stated she did not want to return to her facility because "I was throwing up".  She denied suicidal/homicidal ideations along with depression.  No hallucinations or psychosis.  Psych medicines in place and PRN Zyprexa 2.5 mg order added for any future behavioral issues.  Past Psychiatric History: bipolar, dementia  Risk to Self:  none Risk to Others:  none Prior Inpatient Therapy:  denies Prior Outpatient Therapy:  via  her SNF  Past Medical History:  Past Medical History:  Diagnosis Date   A-fib (HCC)    Anxiety    Chronic kidney disease    Depression    Hyperlipidemia    Hypertension    Iron deficiency    Vascular dementia (HCC)     Past Surgical History:  Procedure Laterality Date   ESOPHAGOGASTRODUODENOSCOPY (EGD) WITH PROPOFOL N/A 07/01/2021   Procedure: ESOPHAGOGASTRODUODENOSCOPY (EGD) WITH PROPOFOL;  Surgeon: Wyline Mood, MD;  Location: [facility] ENDOSCOPY;  Service: Gastroenterology;  Laterality: N/A;   ESOPHAGOGASTRODUODENOSCOPY (EGD) WITH  PROPOFOL N/A 07/27/2021   Procedure: ESOPHAGOGASTRODUODENOSCOPY (EGD) WITH PROPOFOL;  Surgeon: Toney Reil, MD;  Location: [facility] ENDOSCOPY;  Service: Gastroenterology;  Laterality: N/A;   PACEMAKER IMPLANT     Family History: No family history on file. Family Psychiatric  History: unknown Social History:  Social History   Substance and Sexual Activity  Alcohol Use Never     Social History   Substance and Sexual Activity  Drug Use Never    Social History   Socioeconomic History   Marital status: Widowed    Spouse name: Not on file   Number of children: Not on file   Years of education: Not on file   Highest education level: Not on file  Occupational History   Not on file  Tobacco Use   Smoking status: Never   Smokeless tobacco: Never  Vaping Use   Vaping Use: Never used  Substance and Sexual Activity   Alcohol use: Never   Drug use: Never   Sexual activity: Not on file  Other Topics Concern   Not on file  Social History Narrative   Not on file   Social Determinants of Health   Financial Resource Strain: Not on file  Food Insecurity: Not on file  Transportation Needs: Not on file  Physical Activity: Not on file  Stress: Not on file  Social Connections: Not on file   Additional Social History:    Allergies:   Allergies  Allergen Reactions   Iodine Swelling   Amlodipine Other (See Comments)   Nifedipine Swelling and Other (  See Comments)   Iodinated Contrast Media    Lisinopril Swelling   Quinapril Swelling   Shellfish Allergy    Simvastatin Swelling    Labs:  Results for orders placed or performed during the hospital encounter of 10/25/21 (from the past 48 hour(s))  Basic metabolic panel     Status: Abnormal   Collection Time: 10/27/21  4:44 AM  Result Value Ref Range   Sodium 134 (L) 135 - 145 mmol/L   Potassium 4.7 3.5 - 5.1 mmol/L   Chloride 103 98 - 111 mmol/L   CO2 26 22 - 32 mmol/L   Glucose, Bld 90 70 - 99 mg/dL    Comment: Glucose  reference range applies only to samples taken after fasting for at least 8 hours.   BUN 18 8 - 23 mg/dL   Creatinine, Ser 1.82 (H) 0.44 - 1.00 mg/dL   Calcium 9.5 8.9 - 99.3 mg/dL   GFR, Estimated 51 (L) >60 mL/min    Comment: (NOTE) Calculated using the CKD-EPI Creatinine Equation (2021)    Anion gap 5 5 - 15    Comment: Performed at [facility], [address] [place], [place] [address]  Hepatitis C antibody     Status: None   Collection Time: 10/27/21  4:44 AM  Result Value Ref Range   HCV Ab NON REACTIVE NON REACTIVE    Comment: (NOTE) Nonreactive HCV antibody screen is consistent with no HCV infections,  unless recent infection is suspected or other evidence exists to indicate HCV infection.  Performed at [facility], [address] [place], [place] [address]   Hepatitis B surface antigen     Status: None   Collection Time: 10/27/21  4:44 AM  Result Value Ref Range   Hepatitis B Surface Ag NON REACTIVE NON REACTIVE    Comment: Performed at [facility], [address], [place], [place] [address]  Hepatitis B core antibody, total     Status: None   Collection Time: 10/27/21  4:44 AM  Result Value Ref Range   Hep B Core Total Ab NON REACTIVE NON REACTIVE    Comment: Performed at [facility], [address], [place], [place] [address]  Hepatitis B surface antibody,quantitative     Status: Abnormal   Collection Time: 10/27/21  4:44 AM  Result Value Ref Range   Hep B S AB Quant (Post) 3.6 (L) Immunity>9.9 mIU/mL    Comment: (NOTE)  Status of Immunity                     Anti-HBs Level  ------------------                     -------------- Inconsistent with Immunity                   0.0 - 9.9 Consistent with Immunity                          >9.9 Performed At: [facility] [address] [place], [place] [id number] Jolene Schimke MD [id number]   Ammonia     Status: None   Collection Time: 10/27/21  4:44 AM  Result Value Ref  Range   Ammonia 33 9 - 35 umol/L    Comment: Performed at [facility], [address] [place], [place] [address]  Ferritin     Status: None   Collection Time: 10/27/21  4:44 AM  Result Value Ref Range   Ferritin 59 11 - 307 ng/mL    Comment: Performed at [facility], [address] [place], [place] [address]  Iron and TIBC     Status: None   Collection Time: 10/27/21  4:44 AM  Result Value Ref Range   Iron 42 28 - 170 ug/dL   TIBC 604 540 - 981 ug/dL   Saturation Ratios 13 10.4 - 31.8 %   UIBC 272 ug/dL    Comment: Performed at [facility], [address] [place], [place] [address]  Hemoglobin     Status: Abnormal   Collection Time: 10/27/21  4:44 AM  Result Value Ref Range   Hemoglobin 8.9 (L) 12.0 - 15.0 g/dL    Comment: Performed at [facility], [address] [place], [place] [address]  Blood gas, arterial     Status: Abnormal   Collection Time: 10/27/21 11:53 AM  Result Value Ref Range   Delivery systems ROOM AIR    pH, Arterial 7.38 7.35 - 7.45   pCO2 arterial 46 32 - 48 mmHg   pO2, Arterial 75 (L) 83 - 108 mmHg   Bicarbonate 27.2 20.0 - 28.0 mmol/L   Acid-Base Excess 1.5 0.0 - 2.0 mmol/L   O2 Saturation 97.1 %   Patient temperature 37.0    Collection site RIGHT RADIAL    Allens test (pass/fail) PASS PASS    Comment: Performed at [facility], [address] [place], [place] [address]  Basic metabolic panel     Status: Abnormal   Collection Time: 10/28/21  5:28 AM  Result Value Ref Range   Sodium 136 135 - 145 mmol/L   Potassium 4.7 3.5 - 5.1 mmol/L   Chloride 105 98 - 111 mmol/L   CO2 25 22 - 32 mmol/L   Glucose, Bld 90 70 - 99 mg/dL    Comment: Glucose reference range applies only to samples taken after fasting for at least 8 hours.   BUN 16 8 - 23 mg/dL   Creatinine, Ser 6.57 (H) 0.44 - 1.00 mg/dL   Calcium 9.6 8.9 - 84.6 mg/dL   GFR, Estimated 54 (L) >60 mL/min    Comment: (NOTE) Calculated using the  CKD-EPI Creatinine Equation (2021)    Anion gap 6 5 - 15    Comment: Performed at [facility], [address] [place], [place] [address]  Valproic acid level     Status: Abnormal   Collection Time: 10/28/21  5:28 AM  Result Value Ref Range   Valproic Acid Lvl 26 (L) 50.0 - 100.0 ug/mL    Comment: Performed at [facility], [address], [place], [place] [address]    Current Facility-Administered Medications  Medication Dose Route Frequency Provider Last Rate Last Admin   acetaminophen (TYLENOL) tablet 650 mg  650 mg Oral Q6H PRN Mansy, Jan A, MD   650 mg at 10/27/21 2138   Or   acetaminophen (TYLENOL) suppository 650 mg  650 mg Rectal Q6H PRN Mansy, Jan A, MD       alum & mag hydroxide-simeth (MAALOX/MYLANTA) 200-200-20 MG/5ML suspension 15 mL  15 mL Oral Q6H PRN Mansy, Jan A, MD       apixaban Everlene Balls) tablet 2.5 mg  2.5 mg Oral BID Mansy, Jan A, MD   2.5 mg at 10/28/21 0839   bisacodyl (DULCOLAX) suppository 10 mg  10 mg Rectal Daily PRN Mansy, Vernetta Honey, MD  bisacodyl (DULCOLAX) suppository 10 mg  10 mg Rectal Once Alford Highland, MD       carvedilol (COREG) tablet 25 mg  25 mg Oral BID WC Mansy, Jan A, MD   25 mg at 10/28/21 0839   cetirizine (ZYRTEC) tablet 10 mg  10 mg Oral Daily Mansy, Jan A, MD   10 mg at 10/28/21 0840   cloNIDine (CATAPRES - Dosed in mg/24 hr) patch 0.3 mg  0.3 mg Transdermal Q Fri Mansy, Jan A, MD   0.3 mg at 10/28/21 0841   cycloSPORINE (RESTASIS) 0.05 % ophthalmic emulsion 1 drop  1 drop Both Eyes BID Mansy, Jan A, MD   1 drop at 10/28/21 0841   divalproex (DEPAKOTE SPRINKLE) capsule 125 mg  125 mg Oral BID Mansy, Jan A, MD   125 mg at 10/28/21 0840   docusate sodium (COLACE) capsule 200 mg  200 mg Oral BID Alford Highland, MD   200 mg at 10/28/21 0839   ferrous sulfate tablet 325 mg  325 mg Oral Q breakfast Alford Highland, MD   325 mg at 10/28/21 0839   fluticasone (FLONASE) 50 MCG/ACT nasal spray 1 spray  1 spray Each Nare Daily  Mansy, Jan A, MD   1 spray at 10/28/21 0841   hydrALAZINE (APRESOLINE) tablet 50 mg  50 mg Oral Q8H Mansy, Jan A, MD   50 mg at 10/28/21 0540   labetalol (NORMODYNE) injection 20 mg  20 mg Intravenous Q3H PRN Mansy, Jan A, MD   20 mg at 10/28/21 0540   losartan (COZAAR) tablet 100 mg  100 mg Oral Daily Mansy, Jan A, MD   100 mg at 10/28/21 0839   magnesium hydroxide (MILK OF MAGNESIA) suspension 30 mL  30 mL Oral Daily PRN Mansy, Jan A, MD       melatonin tablet 10 mg  10 mg Oral QHS Mansy, Jan A, MD   10 mg at 10/27/21 2139   methocarbamol (ROBAXIN) tablet 500 mg  500 mg Oral Q8H PRN Mansy, Jan A, MD       OLANZapine (ZYPREXA) tablet 2.5 mg  2.5 mg Oral QHS Mansy, Jan A, MD   2.5 mg at 10/27/21 2140   olopatadine (PATANOL) 0.1 % ophthalmic solution 1 drop  1 drop Both Eyes BID Mansy, Jan A, MD   1 drop at 10/28/21 0842   ondansetron (ZOFRAN) tablet 4 mg  4 mg Oral Q6H PRN Mansy, Jan A, MD       Or   ondansetron [facility]) injection 4 mg  4 mg Intravenous Q6H PRN Mansy, Jan A, MD       pantoprazole (PROTONIX) EC tablet 40 mg  40 mg Oral Daily Mansy, Jan A, MD   40 mg at 10/28/21 0839   polyethylene glycol (MIRALAX / GLYCOLAX) packet 17 g  17 g Oral Daily Mansy, Jan A, MD   17 g at 10/28/21 0973   prazosin (MINIPRESS) capsule 1 mg  1 mg Oral QHS Mansy, Jan A, MD   1 mg at 10/27/21 2139   senna-docusate (Senokot-S) tablet 2 tablet  2 tablet Oral BID Mansy, Jan A, MD   2 tablet at 10/28/21 0839   simethicone (MYLICON) chewable tablet 80 mg  80 mg Oral TID PRN Mansy, Jan A, MD       sodium phosphate (FLEET) 7-19 GM/118ML enema 1 enema  1 enema Rectal Daily PRN Wieting, Richard, MD       spironolactone (ALDACTONE) tablet 12.5 mg  12.5  mg [place] Daily Alford Highland, MD   12.5 mg at 10/28/21 0840   traZODone (DESYREL) tablet 25 mg  25 mg Oral QHS PRN Mansy, Jan A, MD   25 mg at 10/27/21 2139   venlafaxine XR (EFFEXOR-XR) 24 hr capsule 75 mg  75 mg Oral Daily Mansy, Jan A, MD   75 mg at 10/28/21 9767     Musculoskeletal: Strength & Muscle Tone: decreased Gait & Station:  did not witness Patient leans: N/A  Psychiatric Specialty Exam: Physical Exam Vitals and nursing note reviewed.  Constitutional:      Appearance: Normal appearance.  HENT:     Head: Normocephalic.     Nose: Nose normal.  Pulmonary:     Effort: Pulmonary effort is normal.  Neurological:     General: No focal deficit present.     Mental Status: She is alert.  Psychiatric:        Attention and Perception: Attention and perception normal.        Mood and Affect: Mood is anxious. Affect is blunt.        Speech: Speech is slurred.        Behavior: Behavior normal. Behavior is cooperative.        Thought Content: Thought content normal.        Cognition and Memory: Cognition normal. Memory is impaired.        Judgment: Judgment normal.     Review of Systems  Psychiatric/Behavioral:  The patient is nervous/anxious.   All other systems reviewed and are negative.   Blood pressure (!) 193/57, pulse 61, temperature 98.1 F (36.7 C), temperature source [place], resp. rate 18, height 5\' 7"  (1.702 m), weight 95 kg, SpO2 100 %.Body mass index is 32.8 kg/m.  General Appearance: Casual  Eye Contact:  Fair  Speech:  Slurred  Volume:  Normal  Mood:  Anxious  Affect:  Blunt  Thought Process:  Coherent  Orientation:  Other:  person  Thought Content:  Logical  Suicidal Thoughts:  No  Homicidal Thoughts:  No  Memory:  Immediate;   Fair Recent;   Fair Remote;   Fair  Judgement:  Fair  Insight:  Lacking  Psychomotor Activity:  Decreased  Concentration:  Concentration: Fair and Attention Span: Fair  Recall:  of Knowledge:  Fair  Language:  Fair  Akathisia:  No  Handed:  Right  AIMS (if indicated):     Assets:  Housing Leisure Time Resilience  ADL's:  Impaired  Cognition:  Impaired,  Mild  Sleep:        Physical Exam: Physical Exam Vitals and nursing note reviewed.  Constitutional:       Appearance: Normal appearance.  HENT:     Head: Normocephalic.     Nose: Nose normal.  Pulmonary:     Effort: Pulmonary effort is normal.  Neurological:     General: No focal deficit present.     Mental Status: She is alert.  Psychiatric:        Attention and Perception: Attention and perception normal.        Mood and Affect: Mood is anxious. Affect is blunt.        Speech: Speech is slurred.        Behavior: Behavior normal. Behavior is cooperative.        Thought Content: Thought content normal.        Cognition and Memory: Cognition normal. Memory is impaired.  Judgment: Judgment normal.    Review of Systems  Psychiatric/Behavioral:  The patient is nervous/anxious.   All other systems reviewed and are negative.  Blood pressure (!) 193/57, pulse 61, temperature 98.1 F (36.7 C), temperature source [place], resp. rate 18, height 5\' 7"  (1.702 m), weight 95 kg, SpO2 100 %. Body mass index is 32.8 kg/m.  Treatment Plan Summary: Bipolar affective disorder: Continue Zyprexa 2.5 mg daily at bedtime Continue Depakote 125 mg BID Continue Effexor 75 mg daily  Insomnia: Continue Trazodone 25 mg daily at bedtime PRN  Nightmares: Continue Prazosin 1 mg daily  Agitation: Started Zyprexa 2.5 mg PRN daily agitation  Disposition: No evidence of imminent risk to self or others at present.   Patient does not meet criteria for psychiatric inpatient admission. Supportive therapy provided about ongoing stressors.  Nanine MeansJamison Kayal Mula, NP 10/28/2021 1:44 PM

## 2021-10-28 NOTE — Progress Notes (Signed)
Eeg done 

## 2021-10-28 NOTE — Discharge Summary (Signed)
Physician Discharge Summary   Patient: Katrina Chen MRN: [id number] DOB: June 05, 1935  Admit date:     10/25/2021  Discharge date: 10/29/21  Discharge Physician: Loletha Grayer   PCP: Cephas Darby, FNP   Recommendations at discharge:   Follow-up with Dr. at facility 2 days Keep appointment with orthopedic surgery Referral to gastroenterology in 3 weeks  Discharge Diagnoses: Principal Problem:   Acute metabolic encephalopathy Active Problems:   Hyponatremia   Atrial fibrillation, chronic (HCC)   CKD (chronic kidney disease), stage IIIa ([place])   Essential hypertension   Bipolar I disorder ([place])   Closed fracture of distal fibula   Vascular dementia with behavioral disturbance (HCC)   Obesity (BMI 30-39.9)   Cirrhosis ([place])   Iron deficiency anemia    Hospital Course: The patient was admitted to the hospital on 10/25/2021 and discharged 10/28/2021.  Came in with altered mental status.  Patient was found to have a very low sodium of 122.  Patient was initially given fluids and sodium came up to 128.  IV fluids were discontinued.  Patient was started on fluid restriction of 1500 mL/day with her urine osmolarity being low.  Sodium came up to 136.  Patient did have some altered mental status yesterday so gabapentin was discontinued.  Need to be careful with tramadol.  ABG did not show any CO2 retention and repeat CAT scan of the head was negative. EEG negative. Seen by psychiatry and recommended zyprexa as needed for agitation.  Assessment and Plan: * Acute metabolic encephalopathy Mental status waxed and waned.  Repeat CT scan of the head negative.  ABG did not show any CO2 retention.  Discontinue gabapentin. Careful with Tramadol.  EEG negative.  Yesterday prior to discharge when EMS arrived the patient started grunting and got rigid with her arms.  We will try again for discharge today.  Hyponatremia Sodium came up from 122 to 128.  Yesterdays sodium is up at 136.  Urine osmolarity  very low likely over drinking water.  Continue fluid restriction 1515ml daily  CKD (chronic kidney disease), stage III (HCC) CKD stage IIIa with a GFR 54 upon discharge and a creatinine of 1.02  Atrial fibrillation, chronic ([place]) Continue her Eliquis and Coreg.  Essential hypertension Patient on numerous antihypertensive medications.  Blood pressure very high this morning but came down after morning medications.  Can consider adding spironolactone as outpatient if blood pressure still remains elevated.  If this medication is started will have to follow-up potassium very closely.  Do not start hydrochlorothiazide because that can worsen sodium.  Iron deficiency anemia Yesterday's hemoglobin 8.9.  Start low-dose iron.  Cirrhosis ([place]) CT scan of the chest commented on the contour of the liver consistent with cirrhosis.  Hepatitis profile is negative.  Ammonia level normal range.  ANA negative.  We will check AMA  -results pending.  Referred to gastroenterology as outpatient.  Obesity (BMI 30-39.9) BMI listed at 32.8  Vascular dementia with behavioral disturbance ([place]) Continue her Zyprexa. Zyprexa as needed for agitation.  Seen by psychiatry and no indication for psychiatric admission.  Closed fracture of distal fibula Left ankle and leg in cast.  Patient nonweightbearing.  Bipolar I disorder ([place]) Continue her Zyprexa, Depakote and Effexor XR.         Consultants: none Procedures performed: none Disposition: Long term care Diet recommendation:  Regular diet fluid restrict 158ml/day DISCHARGE MEDICATION: Allergies as of 10/29/2021       Reactions   Iodine Swelling   Amlodipine  Other (See Comments)   Nifedipine Swelling, Other (See Comments)   Iodinated Contrast Media    Lisinopril Swelling   Quinapril Swelling   Shellfish Allergy    Simvastatin Swelling        Medication List     STOP taking these medications    gabapentin 100 MG capsule Commonly known as:  NEURONTIN   linezolid 600 MG tablet Commonly known as: ZYVOX   methocarbamol 500 MG tablet Commonly known as: ROBAXIN   SPS 15 GM/60ML suspension Generic drug: sodium polystyrene       TAKE these medications    Alumina-Magnesia-Simethicone 200-200-20 MG/5ML suspension Generic drug: alum & mag hydroxide-simeth Take 15 mLs by mouth every 6 (six) hours as needed for indigestion or heartburn.   apixaban 2.5 MG Tabs tablet Commonly known as: ELIQUIS Take 2.5 mg by mouth 2 (two) times daily.   bisacodyl 10 MG suppository Commonly known as: DULCOLAX Place 1 suppository (10 mg total) rectally daily as needed for mild constipation.   carvedilol 25 MG tablet Commonly known as: COREG Take 25 mg by mouth 2 (two) times daily with a meal.   cloNIDine 0.3 mg/24hr patch Commonly known as: CATAPRES - Dosed in mg/24 hr Place 0.3 mg onto the skin every Friday.   cycloSPORINE 0.05 % ophthalmic emulsion Commonly known as: RESTASIS Place 1 drop into both eyes 2 (two) times daily.   divalproex 125 MG capsule Commonly known as: DEPAKOTE SPRINKLE Take 125 mg by mouth 2 (two) times daily.   fluticasone 50 MCG/ACT nasal spray Commonly known as: FLONASE Place 1 spray into both nostrils daily.   hydrALAZINE 50 MG tablet Commonly known as: APRESOLINE Take 1 tablet (50 mg total) by mouth every 8 (eight) hours. What changed:  medication strength how much to take   iron polysaccharides 150 MG capsule Commonly known as: NIFEREX Take 150 mg by mouth daily.   levocetirizine 5 MG tablet Commonly known as: XYZAL Take 5 mg by mouth daily.   losartan 100 MG tablet Commonly known as: COZAAR Take 100 mg by mouth daily.   melatonin 5 MG Tabs Take 10 mg by mouth at bedtime.   meloxicam 7.5 MG tablet Commonly known as: MOBIC Take 7.5 mg by mouth daily.   OLANZapine 2.5 MG tablet Commonly known as: ZYPREXA Take 2.5 mg by mouth at bedtime. What changed: Another medication with the same  name was added. Make sure you understand how and when to take each.   OLANZapine 2.5 MG tablet Commonly known as: ZYPREXA Take 1 tablet (2.5 mg total) by mouth daily as needed (agitation). What changed: You were already taking a medication with the same name, and this prescription was added. Make sure you understand how and when to take each.   olopatadine 0.1 % ophthalmic solution Commonly known as: PATANOL Place 1 drop into both eyes 2 (two) times daily.   omeprazole 20 MG capsule Commonly known as: PRILOSEC Take 20 mg by mouth daily.   ondansetron 4 MG disintegrating tablet Commonly known as: ZOFRAN-ODT Take 4 mg by mouth every 8 (eight) hours as needed for vomiting or nausea.   polyethylene glycol 17 g packet Commonly known as: MIRALAX / GLYCOLAX Take 17 g by mouth 2 (two) times daily. What changed: when to take this   prazosin 1 MG capsule Commonly known as: MINIPRESS Take 1 mg by mouth at bedtime.   senna-docusate 8.6-50 MG tablet Commonly known as: Senokot-S Take 1 tablet by mouth 2 (two) times daily. What  changed: how much to take   simethicone 80 MG chewable tablet Commonly known as: MYLICON Chew 80 mg by mouth 3 (three) times daily as needed for flatulence.   traMADol 50 MG tablet Commonly known as: ULTRAM Take 1 tablet (50 mg total) by mouth every 12 (twelve) hours as needed for moderate pain. What changed: when to take this   venlafaxine XR 75 MG 24 hr capsule Commonly known as: EFFEXOR-XR Take 75 mg by mouth daily.        Follow-up Information     Doctor at facility Follow up in 2 day(s).          Jaynie Collins, DO Follow up in 3 week(s).   Specialty: Gastroenterology Why: cirrhosis on imaging Contact information: [address] [facility] [place] [address] [phone]                Discharge Exam: Ceasar Mons Weights   10/26/21 0037  Weight: 95 kg   Physical Exam HENT:     Head: Normocephalic.      Mouth/Throat:     Pharynx: No oropharyngeal exudate.  Eyes:     General: Lids are normal.     Conjunctiva/sclera: Conjunctivae normal.  Cardiovascular:     Rate and Rhythm: Normal rate and regular rhythm.     Heart sounds: Normal heart sounds, S1 normal and S2 normal.  Pulmonary:     Breath sounds: No decreased breath sounds, wheezing, rhonchi or rales.  Abdominal:     Palpations: Abdomen is soft.  Musculoskeletal:     Right lower leg: No swelling.     Left lower leg: No swelling.     Comments: Left leg in a cast.  Skin:    General: Skin is warm.     Findings: No rash.  Neurological:     Mental Status: She is alert.     Comments: Answers question appropriately      Condition at discharge: stable  The results of significant diagnostics from this hospitalization (including imaging, microbiology, ancillary and laboratory) are listed below for reference.   Imaging Studies: EEG adult  Result Date: 11/20/21 Jefferson Fuel, MD     Nov 20, 2021 10:38 AM Routine EEG Report Devonna Oboyle is a 86 y.o. adult with a history of encephalopathy who is undergoing an EEG to evaluate for seizures. Report: This EEG was acquired with electrodes placed according to the International 10-20 electrode system (including Fp1, Fp2, F3, F4, C3, C4, P3, P4, O1, O2, T3, T4, T5, T6, A1, A2, Fz, Cz, Pz). The following electrodes were missing or displaced: none. The occipital dominant rhythm was 9 Hz. This activity is reactive to stimulation. Drowsiness was manifested by background fragmentation; deeper stages of sleep were not identified. There was no focal slowing. There were no interictal epileptiform discharges. There were no electrographic seizures identified. There was no abnormal response to photic stimulation or hyperventilation. Impression: This EEG was obtained while awake and drowsy and is normal.   Clinical Correlation: Normal EEGs, however, do not rule out epilepsy. Bing Neighbors, MD [facility]  [phone] If 7pm- 7am, please page neurology on call as listed in AMION.   CT HEAD WO CONTRAST ( )  Result Date: 10/27/2021 CLINICAL DATA:  Altered mental status with hyponatremia EXAM: CT HEAD WITHOUT CONTRAST TECHNIQUE: Contiguous axial images were obtained from the base of the skull through the vertex without intravenous contrast. RADIATION DOSE REDUCTION: This exam was performed according to the departmental dose-optimization program which includes automated exposure control,  adjustment of the mA and/or kV according to patient size and/or use of iterative reconstruction technique. COMPARISON:  CT head 10/25/2021 FINDINGS: Brain: There is no acute intracranial hemorrhage, extra-axial fluid collection, or acute infarct. The ventricles are stable in size. Gray-white differentiation is preserved There is no mass lesion.  There is no mass effect or midline shift. Vascular: There is calcification of the bilateral cavernous ICAs. Skull: Normal. Negative for fracture or focal lesion. Sinuses/Orbits: The imaged paranasal sinuses are clear. Bilateral lens implants are in place. The globes and orbits are otherwise unremarkable. Other: None. IMPRESSION: Stable noncontrast head CT with no acute intracranial pathology. Electronically Signed   By: Valetta Mole M.D.   On: 10/27/2021 15:13   CT CHEST WO CONTRAST  Result Date: 10/26/2021 CLINICAL DATA:  Abnormal chest x-ray EXAM: CT CHEST WITHOUT CONTRAST TECHNIQUE: Multidetector CT imaging of the chest was performed following the standard protocol without IV contrast. RADIATION DOSE REDUCTION: This exam was performed according to the departmental dose-optimization program which includes automated exposure control, adjustment of the mA and/or kV according to patient size and/or use of iterative reconstruction technique. COMPARISON:  Chest radiographs, 10/25/2021 CT chest, 07/01/2021 FINDINGS: Cardiovascular: Aortic atherosclerosis. Cardiomegaly. Three-vessel coronary  artery calcifications. No pericardial effusion. Mediastinum/Nodes: No enlarged mediastinal, hilar, or axillary lymph nodes. Frothy debris throughout the esophagus (series 2, image 49). Lungs/Pleura: Small bilateral pleural effusions and associated dependent bibasilar atelectasis or consolidation Upper Abdomen: No acute abnormality. Coarse, nodular contour of the liver in the included upper abdomen. Musculoskeletal: No chest wall abnormality. No acute osseous findings. IMPRESSION: 1. Small bilateral pleural effusions and associated dependent bibasilar atelectasis or consolidation. 2. Frothy debris throughout the esophagus, suggestive of gastroesophageal reflux. May place the patient at risk for aspiration. 3. Cardiomegaly and coronary artery disease. 4. Coarse, nodular contour of the liver in the included upper abdomen, suggestive of cirrhosis. Aortic Atherosclerosis (ICD10-I70.0). Electronically Signed   By: Delanna Ahmadi M.D.   On: 10/26/2021 16:55   CT ABDOMEN PELVIS WO CONTRAST  Result Date: 10/25/2021 CLINICAL DATA:  Abdominal pain. EXAM: CT ABDOMEN AND PELVIS WITHOUT CONTRAST TECHNIQUE: Multidetector CT imaging of the abdomen and pelvis was performed following the standard protocol without IV contrast. RADIATION DOSE REDUCTION: This exam was performed according to the departmental dose-optimization program which includes automated exposure control, adjustment of the mA and/or kV according to patient size and/or use of iterative reconstruction technique. COMPARISON:  June 26, 2021 FINDINGS: Lower chest: Moderate severity atelectasis is seen within the posterior aspect of the bilateral lung bases. There are small bilateral pleural effusions. Hepatobiliary: No focal liver abnormality is seen. No gallstones, gallbladder wall thickening, or biliary dilatation. Pancreas: Unremarkable. No pancreatic ductal dilatation or surrounding inflammatory changes. Spleen: Normal in size without focal abnormality.  Adrenals/Urinary Tract: Adrenal glands are unremarkable. Kidneys are normal, without renal calculi, focal lesion, or hydronephrosis. Bladder is unremarkable. Stomach/Bowel: There is a small hiatal hernia. Appendix appears normal. A large amount of stool is seen within the distal sigmoid colon and rectum. No evidence of bowel dilatation. Noninflamed diverticula are seen throughout the sigmoid colon. A moderate amount of presacral inflammatory fat stranding is seen, posterior to the distal sigmoid colon. Vascular/Lymphatic: Aortic atherosclerosis. No enlarged abdominal or pelvic lymph nodes. Reproductive: Status post hysterectomy. A 3.2 cm x 2.6 cm cyst is seen along the anterior aspect of the right adnexa. Other: No abdominal wall hernia or abnormality. A very small amount of perihepatic fluid is seen. Musculoskeletal: Bilateral metallic density pedicle screws are  seen at the levels of L3, L4, L5 and S1. Degenerative changes are noted throughout the remainder of the lumbar spine. IMPRESSION: 1. Fecal impaction within the distal sigmoid colon and rectum. 2. Sigmoid diverticulosis. 3. Small bilateral pleural effusions. 4. 3.2 cm right adnexal simple-appearing cyst, not adequately characterized. Recommend prompt follow-up with pelvic US. Reference: JACR 2020 Feb;17(2):248-254 5. Postoperative changes throughout the lumbar spine. 6. Aortic atherosclerosis. Aortic Atherosclerosis (ICD10-I70.0). Electronically Signed   By: Virgina Norfolk M.D.   On: 10/25/2021 20:21   CT HEAD WO CONTRAST (5MM)  Result Date: 10/25/2021 CLINICAL DATA:  Altered mental status EXAM: CT HEAD WITHOUT CONTRAST TECHNIQUE: Contiguous axial images were obtained from the base of the skull through the vertex without intravenous contrast. RADIATION DOSE REDUCTION: This exam was performed according to the departmental dose-optimization program which includes automated exposure control, adjustment of the mA and/or kV according to patient size and/or  use of iterative reconstruction technique. COMPARISON:  10/09/2021 FINDINGS: Brain: No evidence of acute infarction, hemorrhage, mass, mass effect, or midline shift. No hydrocephalus or extra-axial fluid collection. Vascular: No hyperdense vessel. Skull: Normal. Negative for fracture or focal lesion. Sinuses/Orbits: No acute finding. Status post bilateral lens replacements. Other: The mastoid air cells are well aerated. IMPRESSION: No acute intracranial process. No etiology seen for the patient's altered mental status. Electronically Signed   By: Merilyn Baba M.D.   On: 10/25/2021 20:20   DG Chest Portable 1 View  Result Date: 10/25/2021 CLINICAL DATA:  Dyspnea EXAM: PORTABLE CHEST 1 VIEW COMPARISON:  None Available. FINDINGS: Lungs volumes are small, but are symmetric and are clear. No pneumothorax or pleural effusion. Cardiac size within normal limits. Lead less pacemaker overlies the cardiac silhouette. Pulmonary vascularity is normal. Osseous structures are age-appropriate. No acute bone abnormality. IMPRESSION: Pulmonary hypoinflation. Electronically Signed   By: Fidela Salisbury M.D.   On: 10/25/2021 20:08   DG Ankle Complete Left  Result Date: 10/09/2021 CLINICAL DATA:  Known left fibular fracture, initial encounter EXAM: LEFT ANKLE COMPLETE - 3+ VIEW COMPARISON:  10/08/2021 FINDINGS: Oblique fracture of the distal fibula is again noted. Casting material is seen in place. The widening of the anterior aspect of the tibiotalar joint has been reduced. No other focal abnormality is noted. IMPRESSION: Stable distal fibular fracture. Reduction in widening of the tibiotalar space. Electronically Signed   By: Inez Catalina M.D.   On: 10/09/2021 21:00   DG Ankle Complete Right  Result Date: 10/09/2021 CLINICAL DATA:  Known left ankle fracture EXAM: RIGHT ANKLE - COMPLETE 3+ VIEW COMPARISON:  None Available. FINDINGS: Flattening of the plantar arch is noted. Tarsal degenerative changes are seen. No acute  fracture or dislocation is noted. No soft tissue abnormality is seen. IMPRESSION: Degenerative changes without acute abnormality. Electronically Signed   By: Inez Catalina M.D.   On: 10/09/2021 21:00   CT Head Wo Contrast  Result Date: 10/09/2021 CLINICAL DATA:  Altered mental status. EXAM: CT HEAD WITHOUT CONTRAST TECHNIQUE: Contiguous axial images were obtained from the base of the skull through the vertex without intravenous contrast. RADIATION DOSE REDUCTION: This exam was performed according to the departmental dose-optimization program which includes automated exposure control, adjustment of the mA and/or kV according to patient size and/or use of iterative reconstruction technique. COMPARISON:  Head CT dated 11/05/2020. FINDINGS: Brain: Mild age-related atrophy and chronic microvascular ischemic changes. There is no acute intracranial hemorrhage. No mass effect or midline shift. No extra-axial fluid collection. Vascular: No hyperdense vessel or unexpected calcification.  Skull: Normal. Negative for fracture or focal lesion. Sinuses/Orbits: No acute finding. Other: None IMPRESSION: 1. No acute intracranial pathology. 2. Mild age-related atrophy and chronic microvascular ischemic changes. Electronically Signed   By: Anner Crete M.D.   On: 10/09/2021 00:18   DG Chest Port 1 View  Result Date: 10/08/2021 CLINICAL DATA:  Altered mental status EXAM: PORTABLE CHEST 1 VIEW COMPARISON:  07/01/2021 FINDINGS: Cardiac shadow is enlarged but stable. Aortic calcifications are again seen. Implantable pacemaker is noted and stable. Mild increased central vascular congestion is noted. No sizable effusion or focal infiltrate is noted. IMPRESSION: Mild vascular congestion. Electronically Signed   By: Inez Catalina M.D.   On: 10/08/2021 23:30   DG Ankle Complete Left  Result Date: 10/08/2021 CLINICAL DATA:  Pain and swelling in the ankle, initial encounter EXAM: LEFT ANKLE COMPLETE - 3+ VIEW COMPARISON:  None  Available. FINDINGS: Oblique fracture is noted in the distal diaphysis of the fibula. No definitive tibial fracture is seen. Mild soft tissue swelling is noted. Widening of the tibiotalar space is noted on the lateral projection. No other fractures are seen. Flattening of the plantar arch is noted. IMPRESSION: Oblique fracture through the distal fibular diaphysis is noted. Some widening of the tibiotalar space is noted on the lateral projection. Flattening of the plantar arch is seen. Electronically Signed   By: Inez Catalina M.D.   On: 10/08/2021 23:17    Microbiology: Results for orders placed or performed during the hospital encounter of 10/25/21  MRSA Next Gen by PCR, Nasal     Status: None   Collection Time: 10/26/21  1:32 AM   Specimen: Nasal Mucosa; Nasal Swab  Result Value Ref Range Status   MRSA by PCR Next Gen NOT DETECTED NOT DETECTED Final    Comment: (NOTE) The GeneXpert MRSA Assay (FDA approved for NASAL specimens only), is one component of a comprehensive MRSA colonization surveillance program. It is not intended to diagnose MRSA infection nor to guide or monitor treatment for MRSA infections. Test performance is not FDA approved in patients less than 41 years old. Performed at [facility], [address] [place], [place] [address]     Labs: CBC: Recent Labs  Lab 10/25/21 2026 10/26/21 0445 10/27/21 0444  WBC 6.6 4.7  --   NEUTROABS 4.7  --   --   HGB 9.9* 8.8* 8.9*  HCT 33.4* 29.8*  --   MCV 77.0* 76.0*  --   PLT 330 274  --    Basic Metabolic Panel: Recent Labs  Lab 10/25/21 2026 10/26/21 0445 10/27/21 0444 10/28/21 0528  NA 122* 128* 134* 136  K 5.0 4.8 4.7 4.7  CL 92* 99 103 105  CO2 21* 25 26 25   GLUCOSE 90 88 90 90  BUN 18 17 18 16   CREATININE 1.19* 1.15* 1.08* 1.02*  CALCIUM 9.4 9.2 9.5 9.6   Liver Function Tests: Recent Labs  Lab 10/25/21 2026  AST 21  ALT 11  ALKPHOS 89  BILITOT 0.5  PROT 6.9  ALBUMIN 3.4*     Discharge  time spent: greater than 30 minutes.  Signed: Loletha Grayer, MD [facility] 10/29/2021

## 2021-10-28 NOTE — Progress Notes (Signed)
IV team consult for new PIV due to high BP. Upon entering the room, I asked patient if it was ok if I gave her a new IV, she stated "No" while shaking her head. Notified RN.

## 2021-10-28 NOTE — Progress Notes (Signed)
On assessment, patient c/o nausea, making a persistent grunting sound. Patient is not dry heaving, there is no emesis. Attempted to give po zofran with bed time meds and PRN po zyprexa, since she does not have PIV. Patient wouldn't take any of her po meds, she pockets the med or spits it out. Tried with different drinks and applesauce, same result. Patient endorses generalized pain, but would not take tylenol. She drank ginger ale fine and continued to ask for it. No issues with taking pills whole last night or earlier today. She is still grunting and moaning intermittently. Notified Manuela Schwartz, NP of above, see orders.   Addendum 0459: Patient took po hydralazine without any issues with water. Requested this nurse remove pillows floating heels. Asked for her dinner tray, informed patient breakfast should arrive in the next couple of hours.

## 2021-10-28 NOTE — TOC Transition Note (Signed)
Transition of Care [facility]) - CM/SW Discharge Note   Patient Details  Name: Katrina Chen MRN: [id number] Date of Birth: 1936-01-02  [facility] of Care [facility]) CM/SW Contact:  Gildardo Griffes, LCSW Phone Number: 10/28/2021, 10:02 AM   Clinical Narrative:     Patient will DC JA:SNKN Anticipated DC date: 10/28/21 Transport [id number]  Per MD patient ready for DC to [facility]. RN, patient, patient's family, and facility notified of DC. Discharge Summary sent to facility. RN given number for report  [phone] Room 106P. DC packet on chart. Ambulance transport requested for patient.  CSW signing off.  Angeline Slim, LCSW    Final next level of care: Skilled Nursing Facility Barriers to Discharge: No Barriers Identified   Patient Goals and CMS Choice Patient states their goals for this hospitalization and ongoing recovery are:: to go home CMS Medicare.gov Compare Post Acute Care list provided to:: Patient Choice offered to / list presented to : Patient  Discharge Placement              Patient chooses bed at: [facility] Patient to be transferred to facility by: [facility]   Patient and family notified of of transfer: 10/28/21  Discharge Plan and Services                                     Social Determinants of Health (SDOH) Interventions     Readmission Risk Interventions     No data to display

## 2021-10-29 DIAGNOSIS — I10 Essential (primary) hypertension: Secondary | ICD-10-CM | POA: Diagnosis not present

## 2021-10-29 DIAGNOSIS — R2990 Unspecified symptoms and signs involving the nervous system: Secondary | ICD-10-CM | POA: Diagnosis not present

## 2021-10-29 DIAGNOSIS — N1831 Chronic kidney disease, stage 3a: Secondary | ICD-10-CM

## 2021-10-29 DIAGNOSIS — F319 Bipolar disorder, unspecified: Secondary | ICD-10-CM | POA: Diagnosis not present

## 2021-10-29 DIAGNOSIS — E871 Hypo-osmolality and hyponatremia: Secondary | ICD-10-CM | POA: Diagnosis not present

## 2021-10-29 LAB — ANA W/REFLEX IF POSITIVE: Anti Nuclear Antibody (ANA): NEGATIVE

## 2021-10-29 MED ORDER — OLANZAPINE 2.5 MG PO TABS: 2.5000 mg | ORAL_TABLET | Freq: Every day | ORAL | 0 refills | Status: DC | PRN

## 2021-10-29 NOTE — Progress Notes (Signed)
Progress Note   Patient: Katrina Chen TMH:[id number] DOB: 1935-03-30 DOA: 10/25/2021     4 DOS: the patient was seen and examined on 10/29/2021     Assessment and Plan: * Acute metabolic encephalopathy Mental status waxed and waned.  Repeat CT scan of the head negative.  ABG did not show any CO2 retention.  Discontinue gabapentin. Careful with Tramadol.  EEG negative.  Yesterday prior to discharge when EMS arrived the patient started grunting and got rigid with her arms.  We will try again for discharge today.  Hyponatremia Sodium came up from 122 to 128.  Yesterdays sodium is up at 136.  Urine osmolarity very low likely over drinking water.  Continue fluid restriction daily  CKD (chronic kidney disease), stage III (HCC) CKD stage IIIa with a GFR 54 upon discharge and a creatinine of 1.02  Atrial fibrillation, chronic (HCC) Continue her Eliquis and Coreg.  Essential hypertension Patient on numerous antihypertensive medications.  Blood pressure very high this morning but came down after morning medications.  Can consider adding spironolactone as outpatient if blood pressure still remains elevated.  If this medication is started will have to follow-up potassium very closely.  Do not start hydrochlorothiazide because that can worsen sodium.  Iron deficiency anemia Yesterday's hemoglobin 8.9.  Start low-dose iron.  Cirrhosis (HCC) CT scan of the chest commented on the contour of the liver consistent with cirrhosis.  Hepatitis profile is negative.  Ammonia level normal range.  ANA negative.  We will check AMA  -results pending.  Referred to gastroenterology as outpatient.  Obesity (BMI 30-39.9) BMI listed at 32.8  Vascular dementia with behavioral disturbance (HCC) Continue her Zyprexa. Zyprexa as needed for agitation.  Seen by psychiatry and no indication for psychiatric admission.  Closed fracture of distal fibula Left ankle and leg in cast.  Patient nonweightbearing.  Bipolar  I disorder (HCC) Continue her Zyprexa, Depakote and Effexor XR.        Subjective: Patient states she does not feel good but cannot elaborate.  Yesterday when EMS arrived the patient got a little rigid and was grunting.  The patient does not want to go back to her facility.  We will try again to discharge today.  Physical Exam: Vitals:   10/28/21 1631 10/28/21 2146 10/29/21 0537 10/29/21 0937  BP: (!) 165/64 (!) 159/102 (!) 151/42 (!) 134/44  Pulse: 80 77 78 71  Resp: 19 (!) 24 18 16   Temp: 98.4 F (36.9 C) 98 F (36.7 C) 98.2 F (36.8 C) 98.3 F (36.8 C)  TempSrc:  Oral Oral Oral  SpO2: 100% 100% 98% 95%  Weight:      Height:       Physical Exam HENT:     Head: Normocephalic.     Mouth/Throat:     Pharynx: No oropharyngeal exudate.  Eyes:     General: Lids are normal.     Conjunctiva/sclera: Conjunctivae normal.  Cardiovascular:     Rate and Rhythm: Normal rate and regular rhythm.     Heart sounds: Normal heart sounds, S1 normal and S2 normal.  Pulmonary:     Breath sounds: No decreased breath sounds, wheezing, rhonchi or rales.  Abdominal:     Palpations: Abdomen is soft.  Musculoskeletal:     Right lower leg: No swelling.     Left lower leg: No swelling.     Comments: Left leg in a cast.  Skin:    General: Skin is warm.     Findings:  No rash.  Neurological:     Mental Status: She is alert.     Comments: Answers question appropriately     Data Reviewed: Last sodium 136, last creatinine 1.02  Family Communication: Spoke with daughter on the phone  Disposition: Status is: Inpatient Remains inpatient appropriate because: We will try again to discharge today  Planned Discharge Destination: Long-term care facility    Time spent: 32 minutes  Author: Alford Highland, MD 10/29/2021 4:10 PM  For on call review [email or web address].

## 2021-10-29 NOTE — Assessment & Plan Note (Signed)
CKD stage IIIa with a GFR 54 upon discharge and a creatinine of 1.02

## 2021-10-29 NOTE — Procedures (Signed)
Routine EEG Report  Katrina Chen is a 86 y.o. adult with a history of encephalopathy who is undergoing an EEG to evaluate for seizures.  Report: This EEG was acquired with electrodes placed according to the International 10-20 electrode system (including Fp1, Fp2, F3, F4, C3, C4, P3, P4, O1, O2, T3, T4, T5, T6, A1, A2, Fz, Cz, Pz). The following electrodes were missing or displaced: none.  The occipital dominant rhythm was 9 Hz. This activity is reactive to stimulation. Drowsiness was manifested by background fragmentation; deeper stages of sleep were not identified. There was no focal slowing. There were no interictal epileptiform discharges. There were no electrographic seizures identified. There was no abnormal response to photic stimulation or hyperventilation.   Impression: This EEG was obtained while awake and drowsy and is normal.    Clinical Correlation: Normal EEGs, however, do not rule out epilepsy.  Bing Neighbors, MD [facility] [phone]  If 7pm- 7am, please page neurology on call as listed in [facility].

## 2021-10-29 NOTE — Progress Notes (Signed)
This RN called [facility] to give report with no answer.

## 2021-10-29 NOTE — TOC Transition Note (Addendum)
Transition of Care [facility]) - CM/SW Discharge Note   Patient Details  Name: Katrina Chen MRN: [id number] Date of Birth: 23-Oct-1935  [facility] CM/SW Contact:  Bing Quarry, RN Phone Number: 10/29/2021, 11:22 AM   Clinical Narrative:  8/19: Patient ready for discharge. Pending daughter's arrival. Notified [facility]. Report call to [phone] Konrad Dolores. Going to Room 106P. EMS forms to unit. Patient is DNR.   Kimball,Cynthia (Daughter) [phone]. [facility] has all needed info, CM will inbox update DC Summary when ready. Gabriel Cirri RN CM  Notified [facility] of report RN not being able to get through phones. To have Konrad Dolores at phones as receptionist is out. Updated Unit RN. Barbie Kalysta Kneisley RN CM   235 pm. [facility] called for transport. Gabriel Cirri RN CM    Final next level of care: Skilled Nursing Facility Barriers to Discharge: Barriers Resolved   Patient Goals and CMS Choice Patient states their goals for this hospitalization and ongoing recovery are:: to go home CMS Medicare.gov Compare Post Acute Care list provided to:: Patient Choice offered to / list presented to : Patient  Discharge Placement              Patient chooses bed at: [facility] [place] Patient to be transferred to facility by: [facility] Name of family member notified: Bridgett Larsson (Daughter)   [phone] (Mobile) (on her way per provider) Patient and family notified of of transfer: 10/29/21  Discharge Plan and Services                DME Arranged: N/A DME Agency: NA       HH Arranged: NA HH Agency: NA        Social Determinants of Health (SDOH) Interventions     Readmission Risk Interventions     No data to display

## 2021-10-29 NOTE — Progress Notes (Signed)
EMS present for pt discharge; discharge packet given to EMS personnel to take to [facility]; pt discharged via stretcher by EMS personnel to [facility]

## 2021-10-31 LAB — ANTI-MICROSOMAL ANTIBODY LIVER / KIDNEY: LKM1 Ab: 0.6 Units (ref 0.0–20.0)

## 2021-11-11 ENCOUNTER — Emergency Department: Payer: Medicare Other

## 2021-11-11 ENCOUNTER — Other Ambulatory Visit: Payer: Self-pay

## 2021-11-11 ENCOUNTER — Emergency Department
Admission: EM | Admit: 2021-11-11 | Discharge: 2021-11-11 | Disposition: A | Discharge status: Skilled Nursing Facility | Payer: Medicare Other | Attending: Emergency Medicine | Admitting: Emergency Medicine

## 2021-11-11 DIAGNOSIS — Z7901 Long term (current) use of anticoagulants: Secondary | ICD-10-CM | POA: Insufficient documentation

## 2021-11-11 DIAGNOSIS — R519 Headache, unspecified: Secondary | ICD-10-CM | POA: Insufficient documentation

## 2021-11-11 DIAGNOSIS — F0390 Unspecified dementia without behavioral disturbance: Secondary | ICD-10-CM | POA: Diagnosis not present

## 2021-11-11 DIAGNOSIS — Y92129 Unspecified place in nursing home as the place of occurrence of the external cause: Secondary | ICD-10-CM | POA: Diagnosis not present

## 2021-11-11 DIAGNOSIS — R109 Unspecified abdominal pain: Secondary | ICD-10-CM | POA: Diagnosis not present

## 2021-11-11 DIAGNOSIS — I129 Hypertensive chronic kidney disease with stage 1 through stage 4 chronic kidney disease, or unspecified chronic kidney disease: Secondary | ICD-10-CM | POA: Insufficient documentation

## 2021-11-11 DIAGNOSIS — W19XXXA Unspecified fall, initial encounter: Secondary | ICD-10-CM

## 2021-11-11 DIAGNOSIS — N189 Chronic kidney disease, unspecified: Secondary | ICD-10-CM | POA: Diagnosis not present

## 2021-11-11 DIAGNOSIS — E041 Nontoxic single thyroid nodule: Secondary | ICD-10-CM | POA: Diagnosis not present

## 2021-11-11 DIAGNOSIS — G8911 Acute pain due to trauma: Secondary | ICD-10-CM | POA: Insufficient documentation

## 2021-11-11 DIAGNOSIS — M545 Low back pain, unspecified: Secondary | ICD-10-CM | POA: Diagnosis not present

## 2021-11-11 DIAGNOSIS — W06XXXA Fall from bed, initial encounter: Secondary | ICD-10-CM | POA: Diagnosis not present

## 2021-11-11 MED ORDER — ACETAMINOPHEN 325 MG PO TABS
650.0000 mg | ORAL_TABLET | Freq: Once | ORAL | Status: AC
  Administered 2021-11-11: 650 mg via ORAL
  Filled 2021-11-11: qty 2

## 2021-11-11 NOTE — ED Notes (Signed)
Pt given perineal care and brief changed. Ems here to transport pt back to the facility

## 2021-11-11 NOTE — ED Provider Notes (Signed)
[facility] Provider Note    Event Date/Time   First MD Initiated Contact with Patient 11/11/21 1111     (approximate)   History   Fall   HPI  Katrina Chen is a 86 y.o. adult   Past medical history of atrial fibrillation on Eliquis, CKD, depression, hyperlipidemia, hypertension, vascular dementia who presents from a facility after sustaining a fall.  Reportedly she is bedbound and while she was being changed she rolled out of bed.  Reportedly no loss of consciousness or head strike.    She said to the triage nurse that she has a headache, but denies this complaint to me, instead stating that her back is hurting her since her fall.  No other reported medical complaints.    History was obtained via facility report, the patient.      Physical Exam   Triage Vital Signs: ED Triage Vitals  Enc Vitals Group     BP 11/11/21 1008 115/75     Pulse Rate 11/11/21 1008 (!) 59     Resp 11/11/21 1008 16     Temp 11/11/21 1008 97.6 F (36.4 C)     Temp Source 11/11/21 1008 Oral     SpO2 11/11/21 1008 98 %     Weight 11/11/21 1010 212 lb (96.2 kg)     Height 11/11/21 1010 5\' 7"  (1.702 m)     Head Circumference --      [facility] --      Pain Score --      Pain Loc --      Pain Edu? --      Excl. in GC? --     Most recent vital signs: Vitals:   11/11/21 1030 11/11/21 1242  BP: (!) 142/49 (!) 152/62  Pulse: (!) 59 (!) 59  Resp:  18  Temp:    SpO2: 98% 99%    General: Awake, no distress.  disoriented. CV:  Good peripheral perfusion. Resp:  Normal effort.  Lung sounds clear to auscultation bilaterally. Abd:  No distention.  Mild tenderness to right side, though unclear if new or baseline " I am always hurting all over." Other:  Moving all extremities, neurovascular intact, gingerly ranging the right shoulder. No step off or deformity to T/L/C spine.  She has an intact cast to the left lower extremity.   ED Results / Procedures / Treatments       RADIOLOGY I dependently reviewed and interpreted CT scan of the head without contrast and see no obvious hemorrhage or midline shift.   PROCEDURES:  Critical Care performed: No  Procedures   MEDICATIONS ORDERED IN ED: Medications  acetaminophen (TYLENOL) tablet 650 mg (650 mg Oral Given 11/11/21 1236)     IMPRESSION / MDM / ASSESSMENT AND PLAN / ED COURSE  I reviewed the triage vital signs and the nursing notes.                              Differential diagnosis includes, but is not limited to, blunt polytrauma assess for intracranial bleeding, cervical spine fracture dislocation, intrathoracic or intra-abdominal injuries, spinal fracture or dislocation.   MDM: Actively low mechanism of injury but given the patient's age and anticoagulation, will assess for injuries with imaging including CT of the head and C-spine, scan of the chest abdomen and pelvis, thoracic and lumbar reconstructions.  I think given there were no significant active findings of injury to  the thoracic or abdomen, will defer contrast-enhanced imaging for this patient to expedite noncontrast CT scan (she has an allergy to iodinated contrast) since one of her chief complaints after the fall is thoracic/lumbar back pain.  We will reimage the left ankle given previous fracture, she complaining of pain though unclear if chronic or acute.  imaging is negative for acute traumatic injuries.  Remained stable in the emergency department.  Previously seen fracture of the distal fibula remains largely unchanged, will have her follow-up with her orthopedist as scheduled.  Dispo: After careful consideration of this patient's presentation, medical and social risk factors, and evaluation in the emergency department I engaged in shared decision making with the patient and/or their representative to consider admission or observation and this patient was ultimately discharged because she has been in stable condition throughout  her emergency department stay and no acute traumatic injuries noted on the above imaging..  Incidental findings were relayed to the patient and I advised her to follow-up with her primary doctor to further assess.  Patient's presentation is most consistent with acute presentation with potential threat to life or bodily function.       FINAL CLINICAL IMPRESSION(S) / ED DIAGNOSES   Final diagnoses:  Fall, initial encounter  Acute bilateral low back pain without sciatica  Thyroid nodule     Rx / DC Orders   ED Discharge Orders     None        Note:  This document was prepared using Dragon voice recognition software and may include unintentional dictation errors.    Pilar Jarvis, MD 11/11/21 1256

## 2021-11-11 NOTE — Discharge Instructions (Addendum)
Tylenol 650 mg every 6 hours for pain.  See your doctor for a follow-up visit this week.  Talk to them about the incidental finding of thyroid nodule and adnexal cyst on your imaging.  Follow-up with your orthopedist about your broken leg this week and to review the x-rays that were taken today.  If you have any new or worsening symptoms, come back to the emergency department for reevaluation.

## 2021-11-11 NOTE — ED Notes (Signed)
This RN to bedside with tylenol. Pt away at imaging. Will give once pt back to room.

## 2021-11-11 NOTE — ED Notes (Signed)
EMS is present.

## 2021-11-11 NOTE — ED Notes (Signed)
When asked pt if dry or wet pt stated she had BM and urinated. Briefs checked and confirmed pt had BM and large amount of urine in briefs. Sonjia RN and this RN provided pt with peri care, changed briefs and bed linens. Repositioned pt on stretcher.

## 2021-11-11 NOTE — ED Notes (Signed)
Secretary arranging for EMS transport back to [facility].

## 2021-11-11 NOTE — ED Notes (Signed)
[facility]  CALLED  FOR  TRANSPORT  TO  [facility] 

## 2021-11-11 NOTE — ED Notes (Signed)
Pt unable to sign, states that she has demons in her brief, like whats in the bible

## 2021-11-11 NOTE — ED Triage Notes (Addendum)
Per EMS report, patient is from [facility]. Per facility report, patient was being changed and rolled out of bed. No LOC was reported. Patient was given Tramadol at the facility. No Injuries reported. Patient has cast on left leg. Patient is bed bound at the facility. Patient's daughter wanted her scanned.   B/P 140/52 Pulse 60 98% on room air.  Patient c/o head pain, but is unable to say whether she hit it in the fall.

## 2021-11-11 NOTE — ED Notes (Signed)
Pt assisted to drink some water.

## 2021-11-11 NOTE — ED Notes (Signed)
See triage note. Pt reports she felt during lunch time today. Pt state fell from sitting/laying position. Pt reports hit her head but denies LOC. A&O x3; self, situation, year; dis to place. Pt reports mild generalized pain. Pt's resp reg/unlabored, skin dry and laying calmly on stretcher. Speech comprehensible.

## 2021-11-11 NOTE — ED Notes (Signed)
NT to bedside to complete updated set of vital signs.

## 2021-11-24 ENCOUNTER — Ambulatory Visit (INDEPENDENT_AMBULATORY_CARE_PROVIDER_SITE_OTHER): Payer: Medicare Other | Admitting: Gastroenterology

## 2021-11-24 ENCOUNTER — Encounter: Payer: Self-pay | Admitting: Gastroenterology

## 2021-11-24 VITALS — BP 179/59 | HR 71 | Temp 97.7°F

## 2021-11-24 DIAGNOSIS — D500 Iron deficiency anemia secondary to blood loss (chronic): Secondary | ICD-10-CM | POA: Diagnosis not present

## 2021-11-24 DIAGNOSIS — D518 Other vitamin B12 deficiency anemias: Secondary | ICD-10-CM

## 2021-11-24 DIAGNOSIS — M5136 Other intervertebral disc degeneration, lumbar region: Secondary | ICD-10-CM | POA: Insufficient documentation

## 2021-11-24 NOTE — Progress Notes (Signed)
Katrina Repress, MD [address]  [address]  [place], [place] [address]  Main: [phone]  Fax: [phone]    Gastroenterology Consultation  Referring Provider:     Wardell Honour, FNP Primary Care Physician:  Katrina Honour, FNP Primary Gastroenterologist:  Dr. Arlyss Chen Reason for Consultation: Establish care for cirrhosis        HPI:   Katrina Chen is a 86 y.o. adult referred by Dr. Yancey Chen, Katrina Parkins, FNP  for consultation & management of difficulty swallowing.  Patient reports that for last few weeks, she has been experiencing difficulty swallowing, she reports that food is getting stuck in her chest such as bread.  Sometimes even liquids.  She does not have any teeth or dentures.  She reports that she eats soft food only and she gums the food.  She is from [facility] accompanied by a transportation person.  Her daughter could not accompany her for her visit today.  She has mild microcytic anemia, most recent hemoglobin 10.1 on 06/26/2021.  She denies any rectal bleeding, abdominal pain, change in bowel habits  Follow-up visit 11/24/2021 Patient is here to establish care for cirrhosis.  Patient is found to have nodular contour of the liver based on cross-sectional imaging on 11/11/2021 when she presented to ER after a fall.  This was performed without contrast.  There was no evidence of portal hypertension, no evidence of splenomegaly, found to have mild pancreatic atrophy.  Secondary liver disease work-up including serum ferritin, hepatitis B, C, ANA, antimicrosomal antibodies were negative.  Patient denies any swelling of legs, abdominal distention.  She does feel bloated, according to patient's daughter who accompanied the visit today states that she has irregular bowel movements.  She lives in a facility.  Patient's daughter is also concerned about confusion.  Labs revealed mild anemia, normal serum ferritin levels, LFTs normal, no evidence of  thrombocytopenia  Patient was treated for H. pylori infection as well as Candida esophagitis this year  NSAIDs: None  Antiplts/Anticoagulants/Anti thrombotics: Eliquis for history of A-fib  GI Procedures:  Upper endoscopy 07/27/2021 - Normal examined duodenum. - A single gastric polyp. Resection not attempted. - Esophageal plaques were found, suspicious for candidiasis. Biopsied. Cells for cytology obtained.  DIAGNOSIS:  A. ESOPHAGUS; COLD BIOPSY:  - CANDIDAL ESOPHAGITIS.  - NEGATIVE FOR DYSPLASIA AND MALIGNANCY.   Upper endoscopy 07/01/2021 - Normal duodenal bulb and second portion of the duodenum. - Two gastric polyps. Resected and retrieved. Clips (MR conditional) were placed. - Erythematous mucosa in the gastric body. Biopsied. - Normal incisura and antrum. Biopsied. - Esophagogastric landmarks identified. - Normal gastroesophageal junction and esophagus.  DIAGNOSIS:  A. STOMACH X2, ANTRUM; HOT SNARE:  - POLYPOID FRAGMENTS OF ANTRAL MUCOSA WITH MILD CHRONIC ACTIVE  HELICOBACTER-ASSOCIATED GASTRITIS AND PATCHY INTESTINAL METAPLASIA.  - SUPERIMPOSED CHANGES CONSISTENT WITH MUCOSAL PROLAPSE.  - NEGATIVE FOR DYSPLASIA AND MALIGNANCY.   B.  STOMACH, RANDOM; COLD BIOPSY:  - MODERATE CHRONIC ACTIVE HELICOBACTER-ASSOCIATED GASTRITIS WITH  INTESTINAL METAPLASIA.  - NEGATIVE FOR DYSPLASIA AND MALIGNANCY.   Past Medical History:  Diagnosis Date   A-fib [facility])    Anxiety    Chronic kidney disease    Depression    Hyperlipidemia    Hypertension    Iron deficiency    Vascular dementia [facility])     Past Surgical History:  Procedure Laterality Date   ESOPHAGOGASTRODUODENOSCOPY (EGD) WITH PROPOFOL N/A 07/01/2021   Procedure: ESOPHAGOGASTRODUODENOSCOPY (EGD) WITH PROPOFOL;  Surgeon: Wyline Mood,  MD;  Location: [facility] ENDOSCOPY;  Service: Gastroenterology;  Laterality: N/A;   ESOPHAGOGASTRODUODENOSCOPY (EGD) WITH PROPOFOL N/A 07/27/2021   Procedure: ESOPHAGOGASTRODUODENOSCOPY (EGD)  WITH PROPOFOL;  Surgeon: Toney Reil, MD;  Location: [facility] ENDOSCOPY;  Service: Gastroenterology;  Laterality: N/A;   PACEMAKER IMPLANT      Current Outpatient Medications:    alum & mag hydroxide-simeth (ALUMINA-MAGNESIA-SIMETHICONE) 200-200-20 MG/5ML suspension, Take 15 mLs by mouth every 6 (six) hours as needed for indigestion or heartburn., Disp: , Rfl:    apixaban (ELIQUIS) 2.5 MG TABS tablet, Take 2.5 mg by mouth 2 (two) times daily., Disp: , Rfl:    bisacodyl (DULCOLAX) 10 MG suppository, Place 1 suppository (10 mg total) rectally daily as needed for mild constipation., Disp: 12 suppository, Rfl: 0   carvedilol (COREG) 25 MG tablet, Take 25 mg by mouth 2 (two) times daily with a meal., Disp: , Rfl:    cloNIDine (CATAPRES - DOSED IN MG/24 HR) 0.3 mg/24hr patch, Place 0.3 mg onto the skin every Friday., Disp: , Rfl:    cycloSPORINE (RESTASIS) 0.05 % ophthalmic emulsion, Place 1 drop into both eyes 2 (two) times daily., Disp: , Rfl:    divalproex (DEPAKOTE SPRINKLE) 125 MG capsule, Take 125 mg by mouth 2 (two) times daily., Disp: , Rfl:    fluticasone (FLONASE) 50 MCG/ACT nasal spray, Place 1 spray into both nostrils daily., Disp: , Rfl:    hydrALAZINE (APRESOLINE) 50 MG tablet, Take 1 tablet (50 mg total) by mouth every 8 (eight) hours., Disp: 90 tablet, Rfl: 0   iron polysaccharides (NIFEREX) 150 MG capsule, Take 150 mg by mouth daily., Disp: , Rfl:    levocetirizine (XYZAL) 5 MG tablet, Take 5 mg by mouth daily., Disp: , Rfl:    losartan (COZAAR) 100 MG tablet, Take 100 mg by mouth daily., Disp: , Rfl:    melatonin 5 MG TABS, Take 10 mg by mouth at bedtime., Disp: , Rfl:    meloxicam (MOBIC) 7.5 MG tablet, Take 7.5 mg by mouth daily., Disp: , Rfl:    OLANZapine (ZYPREXA) 2.5 MG tablet, Take 2.5 mg by mouth at bedtime., Disp: , Rfl:    olopatadine (PATANOL) 0.1 % ophthalmic solution, Place 1 drop into both eyes 2 (two) times daily., Disp: , Rfl:    omeprazole (PRILOSEC) 20 MG  capsule, Take 20 mg by mouth daily., Disp: , Rfl:    ondansetron (ZOFRAN-ODT) 4 MG disintegrating tablet, Take 4 mg by mouth every 8 (eight) hours as needed for vomiting or nausea., Disp: , Rfl:    polyethylene glycol (MIRALAX / GLYCOLAX) 17 g packet, Take 17 g by mouth 2 (two) times daily. (Patient taking differently: Take 17 g by mouth daily.), Disp: 14 each, Rfl: 0   prazosin (MINIPRESS) 1 MG capsule, Take 1 mg by mouth at bedtime., Disp: , Rfl:    senna-docusate (SENOKOT-S) 8.6-50 MG tablet, Take 1 tablet by mouth 2 (two) times daily. (Patient taking differently: Take 2 tablets by mouth 2 (two) times daily.), Disp: , Rfl:    simethicone (MYLICON) 80 MG chewable tablet, Chew 80 mg by mouth 3 (three) times daily as needed for flatulence., Disp: , Rfl:    traMADol (ULTRAM) 50 MG tablet, Take 1 tablet (50 mg total) by mouth every 12 (twelve) hours as needed for moderate pain., Disp: 6 tablet, Rfl: 0   venlafaxine XR (EFFEXOR-XR) 75 MG 24 hr capsule, Take 75 mg by mouth daily., Disp: , Rfl:     No family history on file.  Social History   Tobacco Use   Smoking status: Never   Smokeless tobacco: Never  Vaping Use   Vaping Use: Never used  Substance Use Topics   Alcohol use: Never   Drug use: Never    Allergies as of 11/24/2021 - Review Complete 11/24/2021  Allergen Reaction Noted   Iodine Swelling 11/05/2020   Amlodipine Other (See Comments) 11/06/2020   Nifedipine Swelling and Other (See Comments) 11/06/2020   Iodinated contrast media  12/05/2020   Lisinopril Swelling 11/06/2020   Quinapril Swelling 05/27/2013   Shellfish allergy  11/05/2020   Simvastatin Swelling 05/27/2013    Review of Systems:    All systems reviewed and negative except where noted in HPI.   Physical Exam:  BP (!) 179/59 (BP Location: Left Arm, Patient Position: Sitting, Cuff Size: Normal)   Pulse 71   Temp 97.7 F (36.5 C) (Oral)  No LMP recorded. Patient has had a hysterectomy.  General:   Alert,   Well-developed, well-nourished, pleasant and cooperative in NAD Head:  Normocephalic and atraumatic. Eyes:  Sclera clear, no icterus.   Conjunctiva pink. Ears:  Normal auditory acuity. Nose:  No deformity, discharge, or lesions. Mouth:  No deformity or lesions,oropharynx pink & moist, no teeth. Neck:  Supple; no masses or thyromegaly. Lungs:  Respirations even and unlabored.  Clear throughout to auscultation.   No wheezes, crackles, or rhonchi. No acute distress. Heart:  Regular rate and rhythm; no murmurs, clicks, rubs, or gallops. Abdomen:  Normal bowel sounds. Soft, non-tender and non-distended without masses, hepatosplenomegaly or hernias noted.  No guarding or rebound tenderness.   Rectal: Not performed Msk:  Symmetrical without gross deformities.  In wheelchair Pulses:  Normal pulses noted. Extremities:  No clubbing or edema.  No cyanosis. Neurologic:  Alert and oriented x3;  grossly normal neurologically. Skin:  Intact without significant lesions or rashes. No jaundice. Psych:  Alert and cooperative. Normal mood and affect.  Imaging Studies: No abdominal imaging  Assessment and Plan:   Jasminemarie Sherrard is a 86 y.o. adult with history of A-fib on Eliquis, permanent pacemaker, hypertension, hyperlipidemia, history of H. pylori gastritis s/p triple therapy, history of Candida esophagitis s/p fluconazole, history of iron deficiency anemia s/p treatment, history of B12 deficiency is seen in consultation for possible cirrhosis of liver  ?  Cirrhosis of liver: Other than nodularity based on imaging, patient does not have any other features to suggest chronic liver disease.  The secondary liver disease work-up including serum ferritin, hepatitis B and C antibodies, ANA, anti-LK M antibodies are negative.  Her LFTs are normal, no evidence of thrombocytopenia, volume overload.  EGD did not reveal varices.  I doubt patient has cirrhosis of liver.  Reassured patient's daughter regarding the diagnosis.   Recommend right upper quadrant ultrasound in 6 months  Recommend to start lactulose 30 mL daily to treat constipation  Iron and B12 deficiency anemia, this has been stable Recommend to start oral B12 supplements 1000 mcg daily Continue iron supplements daily Given patient's age and comorbidities, limited functional status, I do not recommend any further work-up at this time  Follow up in 6 months   Katrina Repress, MD

## 2021-12-09 ENCOUNTER — Non-Acute Institutional Stay: Payer: Medicare Other | Admitting: Primary Care

## 2021-12-09 DIAGNOSIS — R54 Age-related physical debility: Secondary | ICD-10-CM

## 2021-12-09 DIAGNOSIS — Z515 Encounter for palliative care: Secondary | ICD-10-CM

## 2021-12-09 DIAGNOSIS — F01518 Vascular dementia, unspecified severity, with other behavioral disturbance: Secondary | ICD-10-CM

## 2021-12-09 DIAGNOSIS — R531 Weakness: Secondary | ICD-10-CM

## 2021-12-09 NOTE — Progress Notes (Signed)
Therapist, nutritional Palliative Care Consult Note Telephone: [phone]  Fax: [phone]    Date of encounter: 12/09/21 12:49 PM PATIENT NAME: Katrina Chen [address] [place] [address]   [phone] (home)  DOB: 04-Jul-1935 MRN: [id number] PRIMARY CARE PROVIDER:    Wardell Honour, FNP,  [address] [place] [address] [phone]  REFERRING PROVIDER:   Wardell Honour, FNP [address] [place],  [place] [address] [phone]  RESPONSIBLE PARTY:    Contact Information     Name Relation Home Work Mobile   Kimball,Cynthia Daughter   [phone]       I met face to face [facility] in [facility]. Palliative Care was asked to follow this patient by consultation request of  Wardell Honour, FNP to address advance care planning and complex medical decision making. This is a follow up visit.                                   ASSESSMENT AND PLAN / RECOMMENDATIONS:   Advance Care Planning/Goals of Care: Goals include to maximize quality of life and symptom management.  CODE STATUS: DNR  Symptom Management/Plan:  Endorses diarrhea, but staff states from lactulose due to constipation. Patient has poor appetite, only eating dessert. Weights increasing. Pt c/o tender L leg which is casted. Appears to have declined in function and alertness over past several months.  Follow up Palliative Care Visit: Palliative care will continue to follow for complex medical decision making, advance care planning, and clarification of goals. Return 8 weeks or prn.  I spent 15 minutes providing this consultation. More than 50% of the time in this consultation was spent in counseling and care coordination.  PPS: 30%  HOSPICE ELIGIBILITY/DIAGNOSIS: TBD  Chief Complaint: debility  HISTORY OF PRESENT ILLNESS:  Katrina Chen is a 86 y.o. year old adult  with dementia, debility . Patient seen today to review palliative care needs to include medical decision making and  advance care planning as appropriate.   History obtained from review of EMR, discussion with primary team, and interview with family, facility staff/caregiver and/or Ms. Peets.  I reviewed available labs, medications, imaging, studies and related documents from the EMR.  Records reviewed and summarized above.   ROS  General: NAD ENMT: denies dysphagia Pulmonary: denies cough, denies increased SOB Abdomen: endorses poor appetite,  endorses  constipation, endorses incontinence of bowel GU: denies dysuria, endorses incontinence of urine MSK:  denies increased weakness, no falls reported Skin: denies rashes or wounds Neurological: denies pain, denies insomnia Psych: Endorses positive mood  Physical Exam: Current and past weights: 208 lbs Constitutional: NAD General: frail appearing EYES: anicteric sclera, lids intact, no discharge  ENMT: intact hearing, oral mucous membranes moist CV:  no LE edema Pulmonary: no increased work of breathing, no cough, room air Abdomen: intake 50%, no ascites MSK: + sarcopenia, moves all extremities,non  -ambulatory, L LE in cast Skin: warm and dry, no rashes or wounds on visible skin Neuro:  + generalized weakness, + cognitive impairment, anxious affect   Thank you for the opportunity to participate in the care of [name].  The palliative care team will continue to follow. Please call our office at [phone] if we can be of additional assistance.   Eliezer Lofts, NP DNP, AGPCNP-BC  COVID-19 PATIENT SCREENING TOOL Asked and negative response unless otherwise noted:   Have you had  symptoms of covid, tested positive or been in contact with someone with symptoms/positive test in the past 5-10 days?

## 2022-01-13 ENCOUNTER — Emergency Department: Payer: Medicare Other

## 2022-01-13 ENCOUNTER — Emergency Department
Admission: EM | Admit: 2022-01-13 | Discharge: 2022-01-14 | Disposition: A | Discharge status: Skilled Nursing Facility | Payer: Medicare Other | Attending: Emergency Medicine | Admitting: Emergency Medicine

## 2022-01-13 ENCOUNTER — Other Ambulatory Visit: Payer: Self-pay

## 2022-01-13 DIAGNOSIS — I482 Chronic atrial fibrillation, unspecified: Secondary | ICD-10-CM | POA: Insufficient documentation

## 2022-01-13 DIAGNOSIS — R7309 Other abnormal glucose: Secondary | ICD-10-CM | POA: Diagnosis not present

## 2022-01-13 DIAGNOSIS — R410 Disorientation, unspecified: Secondary | ICD-10-CM

## 2022-01-13 DIAGNOSIS — F0150 Vascular dementia without behavioral disturbance: Secondary | ICD-10-CM | POA: Diagnosis not present

## 2022-01-13 DIAGNOSIS — Z20822 Contact with and (suspected) exposure to covid-19: Secondary | ICD-10-CM | POA: Diagnosis not present

## 2022-01-13 DIAGNOSIS — I10 Essential (primary) hypertension: Secondary | ICD-10-CM

## 2022-01-13 LAB — CBC WITH DIFFERENTIAL/PLATELET
Abs Immature Granulocytes: 0.01 10*3/uL (ref 0.00–0.07)
Basophils Absolute: 0 10*3/uL (ref 0.0–0.1)
Basophils Relative: 1 %
Eosinophils Absolute: 0.2 10*3/uL (ref 0.0–0.5)
Eosinophils Relative: 3 %
HCT: 39.3 % (ref 36.0–46.0)
Hemoglobin: 11.8 g/dL — ABNORMAL LOW (ref 12.0–15.0)
Immature Granulocytes: 0 %
Lymphocytes Relative: 30 %
Lymphs Abs: 2 10*3/uL (ref 0.7–4.0)
MCH: 24.3 pg — ABNORMAL LOW (ref 26.0–34.0)
MCHC: 30 g/dL (ref 30.0–36.0)
MCV: 81 fL (ref 80.0–100.0)
Monocytes Absolute: 0.7 10*3/uL (ref 0.1–1.0)
Monocytes Relative: 11 %
Neutro Abs: 3.6 10*3/uL (ref 1.7–7.7)
Neutrophils Relative %: 55 %
Platelets: 281 10*3/uL (ref 150–400)
RBC: 4.85 MIL/uL (ref 3.87–5.11)
RDW: 16.3 % — ABNORMAL HIGH (ref 11.5–15.5)
WBC: 6.5 10*3/uL (ref 4.0–10.5)
nRBC: 0 % (ref 0.0–0.2)

## 2022-01-13 LAB — COMPREHENSIVE METABOLIC PANEL
ALT: 13 U/L (ref 0–44)
AST: 20 U/L (ref 15–41)
Albumin: 3.5 g/dL (ref 3.5–5.0)
Alkaline Phosphatase: 73 U/L (ref 38–126)
Anion gap: 11 (ref 5–15)
BUN: 16 mg/dL (ref 8–23)
CO2: 24 mmol/L (ref 22–32)
Calcium: 9.6 mg/dL (ref 8.9–10.3)
Chloride: 99 mmol/L (ref 98–111)
Creatinine, Ser: 1.26 mg/dL — ABNORMAL HIGH (ref 0.44–1.00)
GFR, Estimated: 42 mL/min — ABNORMAL LOW (ref >60)
Glucose, Bld: 103 mg/dL — ABNORMAL HIGH (ref 70–99)
Potassium: 4.7 mmol/L (ref 3.5–5.1)
Sodium: 134 mmol/L — ABNORMAL LOW (ref 135–145)
Total Bilirubin: 0.5 mg/dL (ref 0.3–1.2)
Total Protein: 7 g/dL (ref 6.5–8.1)

## 2022-01-13 LAB — URINALYSIS, ROUTINE W REFLEX MICROSCOPIC
Bilirubin Urine: NEGATIVE
Glucose, UA: NEGATIVE mg/dL
Hgb urine dipstick: NEGATIVE
Ketones, ur: NEGATIVE mg/dL
Leukocytes,Ua: NEGATIVE
Nitrite: NEGATIVE
Protein, ur: NEGATIVE mg/dL
Specific Gravity, Urine: 1.016 (ref 1.005–1.030)
pH: 7 (ref 5.0–8.0)

## 2022-01-13 LAB — RESP PANEL BY RT-PCR (FLU A&B, COVID) ARPGX2
Influenza A by PCR: NEGATIVE
Influenza B by PCR: NEGATIVE
SARS Coronavirus 2 by RT PCR: NEGATIVE

## 2022-01-13 LAB — TROPONIN I (HIGH SENSITIVITY)
Troponin I (High Sensitivity): 16 ng/L (ref <18)
Troponin I (High Sensitivity): 16 ng/L (ref <18)

## 2022-01-13 MED ORDER — HYDRALAZINE HCL 50 MG PO TABS
50.0000 mg | ORAL_TABLET | Freq: Once | ORAL | Status: AC
  Administered 2022-01-13: 50 mg via ORAL
  Filled 2022-01-13: qty 1

## 2022-01-13 NOTE — Discharge Instructions (Addendum)
Your blood and urine tests today are all okay. Your blood pressure was high, and we gave your night time dose of hydralazine 50mg .  Please follow up with your doctor for further evaluation of your symptoms and monitoring of your blood pressure.

## 2022-01-13 NOTE — ED Provider Notes (Signed)
[facility] Provider Note    Event Date/Time   First MD Initiated Contact with Patient 01/13/22 2121     (approximate)   History   Chief Complaint: Altered Mental Status   HPI  Katrina Chen is a 86 y.o. adult with a history of atrial fibrillation, bipolar disorder, hypertension, recurrent UTI, dementia who was brought to the ED today due to confusion for the past 2 days, increased from baseline.  Patient denies any pain, fever,  shortness of breath, vomiting or diarrhea.  She has had rhinorrhea, congestion, nonproductive cough for the past several days.     Physical Exam   Triage Vital Signs: ED Triage Vitals  Enc Vitals Group     BP 01/13/22 1815 (!) 158/60     Pulse Rate 01/13/22 1815 67     Resp 01/13/22 1815 18     Temp 01/13/22 1815 98.7 F (37.1 C)     Temp Source 01/13/22 1815 Oral     SpO2 01/13/22 1815 97 %     Weight 01/13/22 1816 215 lb (97.5 kg)     Height --      Head Circumference --      [facility] --      Pain Score --      Pain Loc --      Pain Edu? --      Excl. in GC? --     Most recent vital signs: Vitals:   01/13/22 2130 01/13/22 2200  BP: (!) 181/69 (!) 181/69  Pulse: 75 69  Resp: 18 18  Temp: 98.5 F (36.9 C)   SpO2: 96% 95%    General: Awake, no distress.  CV:  Good peripheral perfusion.  Regular rate and rhythm Resp:  Normal effort.  Clear to auscultation bilaterally Abd:  No distention.  Soft nontender Other:  No evidence of trauma.  Cranial nerves III through XII intact.  Moving all extremities symmetrically.  No lower extremity edema or calf tenderness.   ED Results / Procedures / Treatments   Labs (all labs ordered are listed, but only abnormal results are displayed) Labs Reviewed  COMPREHENSIVE METABOLIC PANEL - Abnormal; Notable for the following components:      Result Value   Sodium 134 (*)    Glucose, Bld 103 (*)    Creatinine, Ser 1.26 (*)    GFR, Estimated 42 (*)    All other components  within normal limits  CBC WITH DIFFERENTIAL/PLATELET - Abnormal; Notable for the following components:   Hemoglobin 11.8 (*)    MCH 24.3 (*)    RDW 16.3 (*)    All other components within normal limits  URINALYSIS, ROUTINE W REFLEX MICROSCOPIC - Abnormal; Notable for the following components:   Color, Urine YELLOW (*)    APPearance CLEAR (*)    All other components within normal limits  RESP PANEL BY RT-PCR (FLU A&B, COVID) ARPGX2  CBG MONITORING, ED  TROPONIN I (HIGH SENSITIVITY)  TROPONIN I (HIGH SENSITIVITY)     EKG Interpreted by me Atrial fibrillation, rate of 65.  Left axis, normal intervals.  Poor R wave progression.  Normal ST segments and T waves.   RADIOLOGY CT head interpreted by me, unremarkable.  Radiology report reviewed   PROCEDURES:  Procedures   MEDICATIONS ORDERED IN ED: Medications  hydrALAZINE (APRESOLINE) tablet 50 mg (has no administration in time range)     IMPRESSION / MDM / ASSESSMENT AND PLAN / ED COURSE  I reviewed the  triage vital signs and the nursing notes.                              Differential diagnosis includes, but is not limited to, intracranial hemorrhage, intracranial mass, dementia, UTI, hyponatremia/electrolyte abnormality, dehydration, viral illness, pneumonia.  Doubt ACS PE dissection or carditis.  Patient's presentation is most consistent with acute presentation with potential threat to life or bodily function.  Patient presents with increased confusion compared to baseline.  She is pleasantly interactive and engaging, able to answer questions.  Exam is nonfocal.  Vital signs unremarkable except for hypertension.  She is due for her hydralazine.  Lab panel unremarkable.  COVID test pending, after which I think patient is stable for discharge home to her SNF to follow-up with primary care.  Plan of care discussed with her daughter at bedside.       FINAL CLINICAL IMPRESSION(S) / ED DIAGNOSES   Final diagnoses:   Confusion  Chronic atrial fibrillation (HCC)  Vascular dementia, unspecified dementia severity, unspecified whether behavioral, psychotic, or mood disturbance or anxiety ([facility])     Rx / DC Orders   ED Discharge Orders     None        Note:  This document was prepared using Dragon voice recognition software and may include unintentional dictation errors.   Carrie Mew, MD 01/13/22 [phone]

## 2022-01-13 NOTE — ED Notes (Signed)
Pt here from [facility] for AMS. Per EMS family states last seen normal 2 days ago. Pt is unable to talk. Pt is in NAD.

## 2022-01-13 NOTE — ED Triage Notes (Signed)
Pt from [facility] for AMS. Per  EMS, pt was last seen normal 2 days ago. Pt is having some speech problems. In triage pt is having difficulty forming words and speaking clearly. Pt unable to answer any questions during triage.

## 2022-01-18 ENCOUNTER — Encounter (INDEPENDENT_AMBULATORY_CARE_PROVIDER_SITE_OTHER): Payer: Medicare Other | Admitting: Nurse Practitioner

## 2022-01-18 ENCOUNTER — Encounter (INDEPENDENT_AMBULATORY_CARE_PROVIDER_SITE_OTHER): Payer: Medicare Other

## 2022-01-19 ENCOUNTER — Non-Acute Institutional Stay: Payer: Medicare Other | Admitting: Primary Care

## 2022-01-19 DIAGNOSIS — Z515 Encounter for palliative care: Secondary | ICD-10-CM

## 2022-01-19 DIAGNOSIS — R531 Weakness: Secondary | ICD-10-CM

## 2022-01-19 DIAGNOSIS — R54 Age-related physical debility: Secondary | ICD-10-CM

## 2022-01-19 NOTE — Progress Notes (Signed)
  [facility] Palliative Care Consult Note Telephone: [phone]  Fax: [phone]    Date of encounter: 01/19/22 1:45 PM PATIENT NAME: Katrina Chen [address] [place] [address]   [phone] (home)  DOB: 09/06/1935 MRN: [id number] PRIMARY CARE PROVIDER:    Castillo, Faith L, FNP,  [address] [place] [address] [phone]  REFERRING PROVIDER:   Castillo, Faith L, FNP [address] [place],  [place] [address] [phone]  RESPONSIBLE PARTY:    Contact Information     Name Relation Home Work Mobile   Katrina Chen,Katrina Chen Daughter   [phone]        I met face to face with patient  in [facility]. Palliative Care was asked to follow this patient by consultation request of  Castillo, Faith L, FNP to address advance care planning and complex medical decision making. This is a follow up visit.                                   ASSESSMENT AND PLAN / RECOMMENDATIONS:   Advance Care Planning/Goals of Care: Goals include to maximize quality of life and symptom management.  Identification of a healthcare agent - daughter CODE STATUS: DNR  Symptom Management/Plan:  GI; Staff endorses pt attempting to vomit. Intake at lunch 25-50%.  Appears to take in 50-75 % most meals. Also has nutritional supplements and pro stat. Weight stable around a 10 lb variation over some months.  Mood: Staff endorses behavior disturbances.  She tries to make herself throw up they feel. Suggest psychiatric consult if desired or not in place.  Meds reviewed.   Mobility; Mostly in bed now, was oob in chair 6 months ago.  Follow up Palliative Care Visit: Palliative care will continue to follow for complex medical decision making, advance care planning, and clarification of goals. Return 6-8 weeks or prn.  I spent 25 minutes providing this consultation. More than 50% of the time in this consultation was spent in counseling and care coordination.  PPS: 30%  HOSPICE  ELIGIBILITY/DIAGNOSIS: TBD  Chief Complaint: debility  HISTORY OF PRESENT ILLNESS:  Katrina Chen is a 86 y.o. year old adult  with debility, dementia .   History obtained from review of EMR, discussion with primary team, and interview with family, facility staff/caregiver and/or Katrina Chen.  I reviewed available labs, medications, imaging, studies and related documents from the EMR.  Records reviewed and summarized above.   ROS/staff  General: NAD ENMT: denies dysphagia, sometimes attempts to vomit Pulmonary: denies cough, denies increased SOB Abdomen: endorses  fair appetite, denies constipation, endorses incontinence of bowel GU: denies dysuria, endorses incontinence of urine MSK:  endorses  increased weakness,  no falls reported Skin: denies rashes or wounds Neurological: denies pain, denies insomnia Psych: Endorses  withdrawn, sometimes agitated mood Heme/lymph/immuno: denies bruises, abnormal bleeding  Physical Exam: Current and past weights: 205 lbs Constitutional: NAD General: frail appearing EYES: anicteric sclera, lids intact, no discharge  ENMT: intact hearing, oral mucous membranes moist, dentition missing CV:  1 + LE edema Pulmonary:  no increased work of breathing, no cough Abdomen: intake 50%, soft and non tender, no ascites GU: deferred MSK: +sarcopenia, moves all extremities, bedbound Skin: no breakdown on visible skin Neuro:  +generalized weakness,  advanced  cognitive impairment Psych: anxious affect, A and O x 1 Hem/lymph/immuno: no widespread bruising   Thank you for the opportunity to participate in the   care of Katrina Chen. Please call our office at [phone] if we can be of additional assistance.    [place]  DNP, MPH, AGPCNP-BC, ACHPN   COVID-19 PATIENT SCREENING TOOL Asked and negative response unless otherwise noted:   Have you had symptoms of covid, tested positive or been in contact with someone with symptoms/positive test in the  past 5-10 days?   

## 2022-01-25 ENCOUNTER — Encounter (INDEPENDENT_AMBULATORY_CARE_PROVIDER_SITE_OTHER): Payer: Medicare Other | Admitting: Nurse Practitioner

## 2022-01-25 ENCOUNTER — Encounter (INDEPENDENT_AMBULATORY_CARE_PROVIDER_SITE_OTHER): Payer: Medicare Other

## 2022-03-15 ENCOUNTER — Emergency Department: Payer: Medicare Other

## 2022-03-15 ENCOUNTER — Emergency Department
Admission: EM | Admit: 2022-03-15 | Discharge: 2022-03-15 | Disposition: A | Discharge status: Home / Self Care | Payer: Medicare Other | Attending: Emergency Medicine | Admitting: Emergency Medicine

## 2022-03-15 ENCOUNTER — Other Ambulatory Visit: Payer: Self-pay

## 2022-03-15 DIAGNOSIS — M545 Low back pain, unspecified: Secondary | ICD-10-CM | POA: Insufficient documentation

## 2022-03-15 DIAGNOSIS — W06XXXA Fall from bed, initial encounter: Secondary | ICD-10-CM | POA: Insufficient documentation

## 2022-03-15 DIAGNOSIS — S0990XA Unspecified injury of head, initial encounter: Secondary | ICD-10-CM | POA: Insufficient documentation

## 2022-03-15 DIAGNOSIS — M546 Pain in thoracic spine: Secondary | ICD-10-CM | POA: Insufficient documentation

## 2022-03-15 DIAGNOSIS — M542 Cervicalgia: Secondary | ICD-10-CM | POA: Diagnosis not present

## 2022-03-15 DIAGNOSIS — W19XXXA Unspecified fall, initial encounter: Secondary | ICD-10-CM

## 2022-03-15 LAB — URINALYSIS, ROUTINE W REFLEX MICROSCOPIC
Bacteria, UA: NONE SEEN
Bilirubin Urine: NEGATIVE
Glucose, UA: NEGATIVE mg/dL
Ketones, ur: NEGATIVE mg/dL
Leukocytes,Ua: NEGATIVE
Nitrite: NEGATIVE
Protein, ur: NEGATIVE mg/dL
Specific Gravity, Urine: 1.004 — ABNORMAL LOW (ref 1.005–1.030)
pH: 7 (ref 5.0–8.0)

## 2022-03-15 NOTE — ED Provider Triage Note (Signed)
Emergency Medicine Provider Triage Evaluation Note  Katrina Chen , a 87 y.o. adult  was evaluated in triage.  Pt complains of hitting head on floor when being turned in the bed by nursing staff.  No LOC.  +  blood thinners.   Review of Systems  Positive: headache Negative: No lacerations  Physical Exam  BP (!) 182/82 (BP Location: Left Arm)   Pulse (!) 54   Temp (!) 97.5 F (36.4 C) (Oral)   Resp 16   Ht 5\' 7"  (1.702 m)   Wt 97.5 kg   SpO2 94%   BMI 33.67 kg/m  Gen:   Awake, no distress   Resp:  Normal effort  MSK:   Moves extremities without difficulty  Other:  Limited history from patient  Medical Decision Making  Medically screening exam initiated at 8:18 AM.  Appropriate orders placed.  Katrina Chen was informed that the remainder of the evaluation will be completed by another provider, this initial triage assessment does not replace that evaluation, and the importance of remaining in the ED until their evaluation is complete.     [name], PA-C 03/15/22 [phone]

## 2022-03-15 NOTE — Discharge Instructions (Signed)
Please seek medical attention for any high fevers, chest pain, shortness of breath, change in behavior, persistent vomiting, bloody stool or any other new or concerning symptoms.  

## 2022-03-15 NOTE — ED Triage Notes (Signed)
From [facility].  Patient fell out of bed this morning while staff was turning patient.  Hit head on floor.  No LOC.  Takes Eliquis.

## 2022-03-15 NOTE — ED Provider Notes (Signed)
[facility] Provider Note    Event Date/Time   First MD Initiated Contact with Patient 03/15/22 [id number]     (approximate)   History   Fall   HPI  Katrina Chen is a 87 y.o. adult  who presents to the emergency department today because of concern for a fall. Family at bedside gives history. They state that the patient was being changed at her living facility when she rolled out of bed. Hit her head. The patient has been complaining of back pain since then. Additionally the family member is concerned the patient might have a UTI given slight confusion and bad odor to her urine.        Physical Exam   Triage Vital Signs: ED Triage Vitals  Enc Vitals Group     BP 03/15/22 0817 (!) 182/82     Pulse Rate 03/15/22 0817 (!) 54     Resp 03/15/22 0817 16     Temp 03/15/22 0817 (!) 97.5 F (36.4 C)     Temp Source 03/15/22 0817 Oral     SpO2 03/15/22 0817 94 %     Weight 03/15/22 0816 214 lb 15.2 oz (97.5 kg)     Height 03/15/22 0816 5\' 7"  (1.702 m)     Head Circumference --      [facility] --      Pain Score --      Pain Loc --      Pain Edu? --      Excl. in [place]? --     Most recent vital signs: Vitals:   03/15/22 0817  BP: (!) 182/82  Pulse: (!) 54  Resp: 16  Temp: (!) 97.5 F (36.4 C)  SpO2: 94%   General: Awake, alert, oriented. CV:  Good peripheral perfusion.  Resp:  Normal effort.  Abd:  No distention.  Other:  Tenderness to cervical thoracic and lumbar spine. No extremity tenderness.    ED Results / Procedures / Treatments   Labs (all labs ordered are listed, but only abnormal results are displayed) Labs Reviewed  URINALYSIS, ROUTINE W REFLEX MICROSCOPIC     EKG  None   RADIOLOGY I independently interpreted and visualized the CT head. My interpretation: No pneumonia Radiology interpretation:  IMPRESSION:  No acute finding.  No evidence of intracranial injury.   I independently interpreted and visualized the lumbar spine. My  interpretation: no acute osseous abnormality Radiology interpretation:  IMPRESSION:  No fracture or dislocation of the lumbar spine. Status post lumbar  discectomy and fusion of L3 through S1. No evidence of perihardware  fracture.   I independently interpreted and visualized the thoracic spine. My interpretation: No acute abnormality Radiology interpretation:  IMPRESSION:  Multilevel degenerative changes.  No acute abnormality seen.    I independently interpreted and visualized the ct cervical spine. My interpretation: no acute osseous abnormality Radiology interpretation:  IMPRESSION:  Motion degraded exam. Within this limitation, there is no evidence  of cervical spine fracture.    Moderate-severe multilevel degenerative disc disease and facet  arthropathy.    Unchanged 1.8 cm right thyroid nodule. Recommend non-emergent  thyroid US (ref: J Am Coll Radiol. 2015 Feb;12(2): 143-50).     PROCEDURES:  Critical Care performed: No  Procedures   MEDICATIONS ORDERED IN ED: Medications - No data to display   IMPRESSION / MDM / [place] / ED COURSE  I reviewed the triage vital signs and the nursing notes.  Differential diagnosis includes, but is not limited to, fracture, contusion, UTI  Patient's presentation is most consistent with acute presentation with potential threat to life or bodily function.  Patient presented to the emergency department today because of concern for head injury after the fall. The patient also complaining of back pain. Family is concerned for UTI. CT head/cervical spine without concerning findings. Thoracic and lumbar spine negative. Awaiting UA at time of sign out.      FINAL CLINICAL IMPRESSION(S) / ED DIAGNOSES   Fall    Note:  This document was prepared using Dragon voice recognition software and may include unintentional dictation errors.    Nance Pear, MD 03/15/22 [phone]

## 2022-03-15 NOTE — ED Notes (Signed)
First Nurse Note: Pt to ED via [facility] from [facility] for fall. Staff was turning pt in bed and pt fell out of the bed, hitting her head on the wall. No LOC. Pt is on Eliquis. Pt is c/o head pain and left leg pain.   BP: 145/85 HR: 62 SpO2: 100%

## 2022-03-22 ENCOUNTER — Other Ambulatory Visit (INDEPENDENT_AMBULATORY_CARE_PROVIDER_SITE_OTHER): Payer: Self-pay | Admitting: Nurse Practitioner

## 2022-03-22 DIAGNOSIS — I739 Peripheral vascular disease, unspecified: Secondary | ICD-10-CM

## 2022-03-28 ENCOUNTER — Encounter (INDEPENDENT_AMBULATORY_CARE_PROVIDER_SITE_OTHER): Payer: Medicare Other

## 2022-03-28 ENCOUNTER — Encounter (INDEPENDENT_AMBULATORY_CARE_PROVIDER_SITE_OTHER): Payer: Medicare Other | Admitting: Nurse Practitioner

## 2022-04-05 ENCOUNTER — Other Ambulatory Visit: Payer: Self-pay | Admitting: Family Medicine

## 2022-04-05 ENCOUNTER — Non-Acute Institutional Stay: Payer: Medicare Other | Admitting: Nurse Practitioner

## 2022-04-05 DIAGNOSIS — F01518 Vascular dementia, unspecified severity, with other behavioral disturbance: Secondary | ICD-10-CM

## 2022-04-05 DIAGNOSIS — Z515 Encounter for palliative care: Secondary | ICD-10-CM

## 2022-04-05 DIAGNOSIS — R5381 Other malaise: Secondary | ICD-10-CM

## 2022-04-05 DIAGNOSIS — J90 Pleural effusion, not elsewhere classified: Secondary | ICD-10-CM

## 2022-04-05 NOTE — Progress Notes (Addendum)
[facility] Consult Note Telephone: [phone]  Fax: [phone]    Date of encounter: 04/05/22 6:59 PM PATIENT NAME: Katrina Chen [address] [place] [address]   [phone] (home)  DOB: Aug 15, 1935 MRN: [id number] PRIMARY CARE PROVIDER:    [facility]  RESPONSIBLE PARTY:    Contact Information     Name Relation Home Work Mobile   Kimball,Cynthia Daughter   [phone]       I met face to face with patient  in [facility]. Palliative Care was asked to follow this patient by consultation request of  [facility] to address advance care planning and complex medical decision making. This is a follow up visit.                                  ASSESSMENT AND PLAN / RECOMMENDATIONS:  Symptom Management/Plan: 1. Advance Care Planning;  DNR 2. Goals of Care: Goals include to maximize quality of life and symptom management. Our advance care planning conversation included a discussion about:    The value and importance of advance care planning  Exploration of personal, cultural or spiritual beliefs that might influence medical decisions  Exploration of goals of care in the event of a sudden injury or illness  Identification and preparation of a healthcare agent  Review and updating or creation of an advance directive document. 3. Palliative care encounter; Palliative care encounter; Palliative medicine team will continue to support patient, patient's family, and medical team. Visit consisted of counseling and education dealing with the complex and emotionally intense issues of symptom management and palliative care in the setting of serious and potentially life-threatening illness  4. Debility secondary to Dementia progressive, will continue to monitor, follow, supportive role. Reviewed weights; PC f/u visit further discussion monitor trends of appetite, weights, monitor for functional, cognitive decline with chronic disease  progression, assess any active symptoms, supportive role.  Follow up Palliative Care Visit: Palliative care will continue to follow for complex medical decision making, advance care planning, and clarification of goals. Return 4-8 weeks or prn.   I spent 45 minutes providing this consultation. More than 50% of the time in this consultation was spent in counseling and care coordination.   PPS: 30% Chief Complaint: Follow up palliative consult for complex medical decision making, address goals, manage ongoing symptoms   HISTORY OF PRESENT ILLNESS:  Ethal Gotay is a 87 y.o. year old adult  with multiple medical problems including Vascular dementia with behaviors, CAD, Afib, HTN, Asthma, h/o gastric polyps, cirrhosis, CKD, OA multiple sites, spinal stenosis, obesity, iron deficiency anemia, , intraocular lense, h/o posterior vitreous detachment of left eye, protein calorie malnutrition, h/o bilateral total knee replacement, bipolar disorder. Ms. Remus resides at [facility]. Ms Viar requires assistance for mobility, transfers, adl's including bathing, dressing. Ms Rhames is hoyer to chair. Ms Melland requires assistance with feeding. Purpose of today PC f/u visit further discussion monitor trends of appetite, weights, monitor for functional, cognitive decline with chronic disease progression, assess any active symptoms, supportive role.At present Ms Hollis is sitting in the chair in her room. Ms Krell is oriented to self. She did have a recent ED visit on 1/3 2024 for fall with workup significant for moderate to severe multilevel degenerative disc disease facet arthropathy, unchanged 1.8 cm right thyroid nodule. No other significant findings, returned to LTC. Currently Ms Sachs appears comfortable, no  visitors present. Ms Faulconer does make eye contact, able to say few words, though cognitively impaired, no meaningful discussion. Ms Hendershott was cooperative. Supportive visit. Reviewed weights, vss, medications, goc,  poc. Attempted to contact dtg, no new changes today to poc. Updated staff  History obtained from review of EMR, discussion with primary team, and interview with family, facility staff/caregiver and/or Ms. Kunert.  I reviewed available labs, medications, imaging, studies and related documents from the EMR.  Records reviewed and summarized above.    Physical Exam: Constitutional: NAD General: frail appearing, elderly, debilitated female ENMT: oral mucous membranes moist CV:  1 + LE edema, RRR Pulmonary:  no increased work of breathing, no cough Abdomen: soft and non tender MSK:  bedbound Skin: no breakdown on visible skin Neuro:  +generalized weakness,  advanced  cognitive impairment Psych: anxious affect, A and Oriented to self  Thank you for the opportunity to participate in the care of Ms. Hockenberry. Please call our office at [phone] if we can be of additional assistance.   Spirit Wernli Ihor Gully, NP

## 2022-04-13 ENCOUNTER — Ambulatory Visit
Admission: RE | Admit: 2022-04-13 | Discharge: 2022-04-13 | Disposition: A | Discharge status: Home / Self Care | Payer: Medicare Other | Source: Ambulatory Visit | Attending: Family Medicine | Admitting: Family Medicine

## 2022-04-13 DIAGNOSIS — J90 Pleural effusion, not elsewhere classified: Secondary | ICD-10-CM | POA: Diagnosis not present

## 2022-04-22 ENCOUNTER — Other Ambulatory Visit: Payer: Self-pay

## 2022-04-22 ENCOUNTER — Emergency Department: Payer: Medicare Other

## 2022-04-22 ENCOUNTER — Emergency Department
Admission: EM | Admit: 2022-04-22 | Discharge: 2022-04-22 | Disposition: A | Discharge status: Skilled Nursing Facility | Payer: Medicare Other | Attending: Emergency Medicine | Admitting: Emergency Medicine

## 2022-04-22 DIAGNOSIS — N189 Chronic kidney disease, unspecified: Secondary | ICD-10-CM | POA: Diagnosis not present

## 2022-04-22 DIAGNOSIS — W19XXXA Unspecified fall, initial encounter: Secondary | ICD-10-CM

## 2022-04-22 DIAGNOSIS — F0150 Vascular dementia without behavioral disturbance: Secondary | ICD-10-CM | POA: Diagnosis not present

## 2022-04-22 DIAGNOSIS — I129 Hypertensive chronic kidney disease with stage 1 through stage 4 chronic kidney disease, or unspecified chronic kidney disease: Secondary | ICD-10-CM | POA: Diagnosis not present

## 2022-04-22 DIAGNOSIS — M25461 Effusion, right knee: Secondary | ICD-10-CM | POA: Diagnosis not present

## 2022-04-22 DIAGNOSIS — I4891 Unspecified atrial fibrillation: Secondary | ICD-10-CM | POA: Diagnosis not present

## 2022-04-22 DIAGNOSIS — W06XXXA Fall from bed, initial encounter: Secondary | ICD-10-CM | POA: Diagnosis not present

## 2022-04-22 DIAGNOSIS — M79604 Pain in right leg: Secondary | ICD-10-CM | POA: Diagnosis present

## 2022-04-22 LAB — COMPREHENSIVE METABOLIC PANEL
ALT: 9 U/L (ref 0–44)
AST: 18 U/L (ref 15–41)
Albumin: 3.3 g/dL — ABNORMAL LOW (ref 3.5–5.0)
Alkaline Phosphatase: 64 U/L (ref 38–126)
Anion gap: 8 (ref 5–15)
BUN: 16 mg/dL (ref 8–23)
CO2: 28 mmol/L (ref 22–32)
Calcium: 9.8 mg/dL (ref 8.9–10.3)
Chloride: 96 mmol/L — ABNORMAL LOW (ref 98–111)
Creatinine, Ser: 1 mg/dL (ref 0.44–1.00)
GFR, Estimated: 56 mL/min — ABNORMAL LOW (ref >60)
Glucose, Bld: 92 mg/dL (ref 70–99)
Potassium: 5 mmol/L (ref 3.5–5.1)
Sodium: 132 mmol/L — ABNORMAL LOW (ref 135–145)
Total Bilirubin: 0.5 mg/dL (ref 0.3–1.2)
Total Protein: 7 g/dL (ref 6.5–8.1)

## 2022-04-22 LAB — CBC WITH DIFFERENTIAL/PLATELET
Abs Immature Granulocytes: 0.02 10*3/uL (ref 0.00–0.07)
Basophils Absolute: 0 10*3/uL (ref 0.0–0.1)
Basophils Relative: 1 %
Eosinophils Absolute: 0.2 10*3/uL (ref 0.0–0.5)
Eosinophils Relative: 3 %
HCT: 35.5 % — ABNORMAL LOW (ref 36.0–46.0)
Hemoglobin: 11.2 g/dL — ABNORMAL LOW (ref 12.0–15.0)
Immature Granulocytes: 0 %
Lymphocytes Relative: 24 %
Lymphs Abs: 1.5 10*3/uL (ref 0.7–4.0)
MCH: 27.2 pg (ref 26.0–34.0)
MCHC: 31.5 g/dL (ref 30.0–36.0)
MCV: 86.2 fL (ref 80.0–100.0)
Monocytes Absolute: 0.6 10*3/uL (ref 0.1–1.0)
Monocytes Relative: 10 %
Neutro Abs: 4 10*3/uL (ref 1.7–7.7)
Neutrophils Relative %: 62 %
Platelets: 226 10*3/uL (ref 150–400)
RBC: 4.12 MIL/uL (ref 3.87–5.11)
RDW: 15.1 % (ref 11.5–15.5)
WBC: 6.3 10*3/uL (ref 4.0–10.5)
nRBC: 0 % (ref 0.0–0.2)

## 2022-04-22 MED ORDER — OXYCODONE HCL 5 MG PO TABS: 5.0000 mg | ORAL_TABLET | Freq: Three times a day (TID) | ORAL | 0 refills | Status: AC | PRN

## 2022-04-22 MED ORDER — OXYCODONE-ACETAMINOPHEN 5-325 MG PO TABS
1.0000 | ORAL_TABLET | Freq: Once | ORAL | Status: AC
  Administered 2022-04-22: 1 via ORAL
  Filled 2022-04-22: qty 1

## 2022-04-22 NOTE — ED Notes (Signed)
Patient transported to X-ray 

## 2022-04-22 NOTE — ED Provider Notes (Addendum)
[facility] Provider Note    Event Date/Time   First MD Initiated Contact with Patient 04/22/22 1300     (approximate)   History   Fall and Leg Pain   HPI  Katrina Chen is a 87 y.o. adult past medical history of A-fib, CKD, depression, hyperlipidemia, hypertension, vascular dementia who presents after a fall.  Per nursing EMS notes that patient has had several falls out of bed.  These occur while she is being changed.  She currently is at [facility] they have been using a 1 person assist you know she is a two-person assist.  Ruled out of bed today around 1130.  Unclear if she hit her head.  History is somewhat limited from the patient Past Medical History:  Diagnosis Date   A-fib [facility])    Anxiety    Chronic kidney disease    Depression    Hyperlipidemia    Hypertension    Iron deficiency    Vascular dementia [facility])     Patient Active Problem List   Diagnosis Date Noted   Degeneration of lumbar intervertebral disc 11/24/2021   Iron deficiency anemia 123456   Acute metabolic encephalopathy 123456   Vascular dementia with behavioral disturbance ([place]) 10/26/2021   Obesity (BMI 30-39.9) 10/26/2021   Cirrhosis ([place]) 10/26/2021   Abnormal gait 10/25/2021   Acquired deformity of toe 10/25/2021   Closed fracture of distal fibula    Dementia ([place]) 10/09/2021   Fall 10/09/2021   Gastric atrophy    Gastric polyps    Acute UTI 06/16/2021   General weakness 06/16/2021   Pleural effusion w/ enlarged cardiac silhouette on CXR, trace edema 06/16/2021   Spinal stenosis of lumbar region 11/12/2020   Hyponatremia 11/12/2020   Nonspecific chest pain 11/05/2020   CAD (coronary artery disease) 11/05/2020   Hyperkalemia 11/05/2020   Obstipation 11/05/2020   History of bipolar disorder 11/05/2020   Atrial fibrillation, chronic ([place]) 11/05/2020   CKD (chronic kidney disease), stage III ([place]) 11/05/2020   Weakness 11/05/2020   Protein-calorie  malnutrition ([place]) 11/05/2020   Disorder of refraction 06/05/2019   Dry eye syndrome of both eyes 06/05/2019   Posterior vitreous detachment of left eye 06/05/2019   Presence of intraocular lens 06/05/2019   Bipolar I disorder ([place]) 12/11/2013   Status post total bilateral knee replacement 05/27/2013   Previous back surgery 05/27/2013   Essential hypertension 04/22/2013   Knee pain, chronic 04/22/2013   Asthma 04/05/2011   Generalized osteoarthritis of multiple sites 08/17/2010   Bipolar affective disorder, manic, severe ([place]) 08/11/2010   Anemia 12/20/2007   Arthropathy 09/25/2006   Acquired absence of genital organ 08/17/2006   Hyperlipidemia 08/17/2006     Physical Exam  Triage Vital Signs: ED Triage Vitals  Enc Vitals Group     BP --      Pulse --      Resp --      Temp 04/22/22 1300 97.9 F (36.6 C)     Temp Source 04/22/22 1300 Oral     SpO2 04/22/22 1300 99 %     Weight 04/22/22 1303 210 lb (95.3 kg)     Height 04/22/22 1303 5' 6"$  (1.676 m)     Head Circumference --      [facility] --      Pain Score 04/22/22 1301 6     Pain Loc --      Pain Edu? --      Excl. in [place]? --  Most recent vital signs: Vitals:   04/22/22 1305 04/22/22 1415  BP:  (!) 181/67  Pulse: 60 (!) 59  Resp: 17 14  Temp:    SpO2: 99% 100%     General: Awake, no distress.  CV:  Good peripheral perfusion.  Resp:  Normal effort.  No chest wall tenderness Abd:  No distention.  Abdomen is soft no significant tenderness Neuro:             Awake, Alert, Oriented x 3  Other:  Knee swelling with effusion, tender to palpation, difficult to range, lower extremity compartments soft Significant tenderness or swelling of the left knee Patient able to range bilateral hips No midline C, T or L-spine tenderness   ED Results / Procedures / Treatments  Labs (all labs ordered are listed, but only abnormal results are displayed) Labs Reviewed  COMPREHENSIVE METABOLIC PANEL - Abnormal; Notable for  the following components:      Result Value   Sodium 132 (*)    Chloride 96 (*)    Albumin 3.3 (*)    GFR, Estimated 56 (*)    All other components within normal limits  CBC WITH DIFFERENTIAL/PLATELET - Abnormal; Notable for the following components:   Hemoglobin 11.2 (*)    HCT 35.5 (*)    All other components within normal limits     EKG  EKG reviewed and interpreted by myself shows ventricular paced complexes with underlying a flutter no S[place]ossa criteria for ischemia   RADIOLOGY I reviewed and interpreted the CT scan of the brain which does not show any acute intracranial process    PROCEDURES:  Critical Care performed: No  Procedures  The patient is on the cardiac monitor to evaluate for evidence of arrhythmia and/or significant heart rate changes.   MEDICATIONS ORDERED IN ED: Medications  oxyCODONE-acetaminophen (PERCOCET/ROXICET) 5-325 MG per tablet 1 tablet (1 tablet Oral Given 04/22/22 1453)     IMPRESSION / MDM / ASSESSMENT AND PLAN / ED COURSE  I reviewed the triage vital signs and the nursing notes.                              Patient's presentation is most consistent with acute complicated illness / injury requiring diagnostic workup.  Differential diagnosis includes, but is not limited to, intracranial injury, cervical spine fracture, patella fracture, tibial plateau fracture, ligamentous injury of the knee, hemarthrosis  Patient is a 87 year old female resents after fall out of bed today.  Patient's daughter is at bedside tells me that she rolled out of bed when she was being changed at the facility.  Apparently this has happened several times.  Unclear if patient hit her head.  Patient is a difficult historian and does complain of knee pain.  On exam she has no midline C, T or L-spine tenderness no signs of trauma to her head chest abdomen or pelvis.  Right knee is significantly swollen with an effusion she has difficulty ranging it.  No tenderness to  the left lower extremity.  There was concern for left hip pain in triage so a left hip x-ray was ordered.  This will also get visualization of the pelvis.  Will also obtain x-ray of the right knee and CT of the head and neck.  Patient's daughter confirms that she is at her baseline in terms of her mental status.  X-ray of the right knee is negative for fracture.  Patient has prior  knee replacement.  There is mention of fat fluid level.  Feel that utility of CT will be low given patient's prosthetic.  Also less likelihood of a tibial plateau fracture given the replacement.  There is no evidence of prosthetic loosening on exam.  Patient's compartment is soft.  Could be due to underlying ligamentous injury or occult patella fracture.  Given patient is immobile do not feel that she needs urgent imaging and can follow-up with orthopedics if this is not improving.  Recommended she elevate the leg and ice.  Patient takes tramadol at her facility for pain.  I have prescribed a short course of oxycodone as needed for more severe pain.  CT head and C-spine are negative.  X-ray of the pelvis is negative for fracture.  Will discharge patient back to facility.  FINAL CLINICAL IMPRESSION(S) / ED DIAGNOSES   Final diagnoses:  Fall, initial encounter  Knee effusion, right     Rx / DC Orders   ED Discharge Orders     None        Note:  This document was prepared using Dragon voice recognition software and may include unintentional dictation errors.   Rada Hay, MD 04/22/22 1449    Rada Hay, MD 04/22/22 1540

## 2022-04-22 NOTE — ED Notes (Addendum)
Still no answer at [facility]. Charge RN informed. Will attempt calling once more at departure

## 2022-04-22 NOTE — ED Notes (Signed)
Dr Starleen Blue at bedside. Pt has swelling noted to Right knee and leg, pt complaining of pain in both legs and hip. Pt not cooperative with exam, resisting moving all extremities.

## 2022-04-22 NOTE — ED Notes (Signed)
[facility] called to check status on transport to [facility], still waiting for truck to become available per C-com

## 2022-04-22 NOTE — ED Notes (Signed)
Attempted contact for D/C report. No answer. Family in room. Waiting on EMS

## 2022-04-22 NOTE — ED Triage Notes (Signed)
Pt brought to ER via [facility] from [facility]. Pt had a fall this morning at 1130. Pt was being cleaned and rolled off the bed, per EMS she is a two person assist which the facility has not been doing. Pt has a hx of rolling off the bed due to low personnel, last time pt fractured her leg. Famly asked for pt to be brought to ED to be evaluated for fall. Pt does complain of Left leg pain and per EMS pt is in chronic pain and has a hx of dementia. Pt denies any CP, SOB.

## 2022-04-22 NOTE — ED Notes (Signed)
Still waiting on EMS for transport.

## 2022-04-22 NOTE — Discharge Instructions (Addendum)
CAT scans of your head and not did not show any injury.  X-ray of your knee showed that the prosthetic was in the right location there is no obvious broken bone.  You do have a knee effusion.  Please elevate the leg and ice the knee to help with swelling.  If your tramadol is not relieving her pain you can use the oxycodone but please do not use them both together.  Please follow-up with your orthopedist if your knee pain is not improving.

## 2022-05-02 ENCOUNTER — Non-Acute Institutional Stay: Payer: Medicare Other | Admitting: Nurse Practitioner

## 2022-05-02 ENCOUNTER — Encounter: Payer: Self-pay | Admitting: Nurse Practitioner

## 2022-05-02 DIAGNOSIS — R5381 Other malaise: Secondary | ICD-10-CM

## 2022-05-02 DIAGNOSIS — Z515 Encounter for palliative care: Secondary | ICD-10-CM

## 2022-05-02 DIAGNOSIS — F01518 Vascular dementia, unspecified severity, with other behavioral disturbance: Secondary | ICD-10-CM

## 2022-05-02 NOTE — Progress Notes (Signed)
[facility] Consult Note Telephone: [phone]  Fax: [phone]    Date of encounter: 05/02/22 5:13 PM PATIENT NAME: Katrina Chen [address] [place] [address]   [phone] (home)  DOB: [birth year] MRN: [id number] PRIMARY CARE PROVIDER:    Cephas Darby, FNP,  [facility]  RESPONSIBLE PARTY:    Contact Information     Name Relation Home Work Mobile   Kimball,Cynthia Daughter   [phone]     I met face to face with patient  in [facility]. Palliative Care was asked to follow this patient by consultation request of  [facility] to address advance care planning and complex medical decision making. This is a follow up visit.                                  ASSESSMENT AND PLAN / RECOMMENDATIONS:  Symptom Management/Plan: 1. Advance Care Planning;  DNR 2. Goals of Care: Goals include to maximize quality of life and symptom management. Our advance care planning conversation included a discussion about:    The value and importance of advance care planning  Exploration of personal, cultural or spiritual beliefs that might influence medical decisions  Exploration of goals of care in the event of a sudden injury or illness  Identification and preparation of a healthcare agent  Review and updating or creation of an advance directive document. 3. Palliative care encounter; Palliative care encounter; Palliative medicine team will continue to support patient, patient's family, and medical team. Visit consisted of counseling and education dealing with the complex and emotionally intense issues of symptom management and palliative care in the setting of serious and potentially life-threatening illness   4. Debility secondary to Dementia progressive, will continue to monitor, follow, supportive role. Reviewed weights; PC f/u visit further discussion monitor trends of appetite, weights, monitor for functional, cognitive decline  with chronic disease progression, assess any active symptoms, supportive role.  04/22/2022 weight 210 lbs 04/22/2022 sodium 132; potassium 5.0; chloride 96; Co2 28; calcium 9.8; BUN 16; creatinine 1.00; glucose 92; AST 18; ALT 9; total protein 7.0; albumin 3.3; wbc 6.3; hgb 11.2; hct 35.5; platelets 226 Follow up Palliative Care Visit: PC f/u visit further discussion monitor trends of appetite, weights, monitor for functional, cognitive decline with chronic disease progression, assess any active symptoms, supportive role. At present Katrina Katrina Chen is sitting in the chair in her room. Katrina Katrina Chen is oriented to self. Palliative care will continue to follow for complex medical decision making, advance care planning, and clarification of goals. Return 4-8 weeks or prn.   I spent 45 minutes providing this consultation starting at 10:00am. More than 50% of the time in this consultation was spent in counseling and care coordination.   PPS: 30% Chief Complaint: Follow up palliative consult for complex medical decision making, address goals, manage ongoing symptoms   HISTORY OF PRESENT ILLNESS:  Katrina Chen is a 87 y.o. year old adult  with multiple medical problems including Vascular dementia with behaviors, CAD, Afib, HTN, Asthma, h/o gastric polyps, cirrhosis, CKD, OA multiple sites, spinal stenosis, obesity, iron deficiency anemia, , intraocular lense, h/o posterior vitreous detachment of left eye, protein calorie malnutrition, h/o bilateral total knee replacement, bipolar disorder. Katrina. Montag resides at [facility]. Katrina Wiebers requires assistance for mobility, transfers, adl's including bathing, dressing. Katrina Katrina Chen is hoyer to chair. Katrina Katrina Chen requires assistance with feeding.  ED visit on 1/3 2024 for fall with workup significant for moderate to severe multilevel degenerative disc disease facet arthropathy, unchanged 1.8 cm right thyroid nodule. No other significant findings, returned to LTC.   ED visit on 04/22/2022  for fall with leg pain with workup unremarkable resulting in right knee effusion.   Purpose of today PC f/u visit further discussion monitor trends of appetite, weights, monitor for functional, cognitive decline with chronic disease progression, assess any active symptoms, supportive role. At present Katrina Katrina Chen is lying in bed, sleeping, arouse to verbal cues. Katrina Katrina Chen is oriented to self. Katrina Katrina Chen does make eye contact, able to say few words, though cognitively impaired, no meaningful discussion. Katrina Katrina Chen was cooperative. Supportive visit. Reviewed weights, vss, medications, goc, poc. Attempted to contact dtg, no new changes today to poc. Updated staff, no new changes recommended today; support provided.  History obtained from review of EMR, discussion with primary team, and interview with family, facility staff/caregiver and/or Katrina. Katrina Chen.  I reviewed available labs, medications, imaging, studies and related documents from the EMR.  Records reviewed and summarized above.    Physical Exam: General: frail appearing, elderly, debilitated female ENMT: oral mucous membranes moist CV:  1 + LE edema, RRR Pulmonary:  Breath sounds decreased throughout MSK:  functional quadriplegic Neuro:  +generalized weakness,  advanced  cognitive impairment Psych: anxious affect, A and Oriented to self  Thank you for the opportunity to participate in the care of Katrina. Chen. Please call our office at [phone] if we can be of additional assistance.   Koen Antilla Ihor Gully, NP

## 2022-06-19 ENCOUNTER — Non-Acute Institutional Stay: Payer: Medicare Other | Admitting: Nurse Practitioner

## 2022-06-19 DIAGNOSIS — R5381 Other malaise: Secondary | ICD-10-CM

## 2022-06-19 DIAGNOSIS — F01518 Vascular dementia, unspecified severity, with other behavioral disturbance: Secondary | ICD-10-CM

## 2022-06-19 DIAGNOSIS — Z515 Encounter for palliative care: Secondary | ICD-10-CM

## 2022-06-19 NOTE — Progress Notes (Signed)
Therapist, nutritional Palliative Care Consult Note Telephone: [phone]  Fax: [phone]    Date of encounter: 06/19/22 3:24 PM PATIENT NAME: Katrina Chen [address] [place] [address]   [phone] (home)  DOB: November 23, 1935 MRN: [id number] PRIMARY CARE PROVIDER:    [facility]  RESPONSIBLE PARTY:    Contact Information     Name Relation Home Work Mobile   Kimball,Cynthia Daughter   [phone]        I met face to face with patient  in [facility]. Palliative Care was asked to follow this patient by consultation request of  [facility] to address advance care planning and complex medical decision making. This is a follow up visit.                                  ASSESSMENT AND PLAN / RECOMMENDATIONS:  Symptom Management/Plan: 1. Advance Care Planning;  DNR 2. Palliative care encounter; Palliative care encounter; Palliative medicine team will continue to support patient, patient's family, and medical team. Visit consisted of counseling and education dealing with the complex and emotionally intense issues of symptom management and palliative care in the setting of serious and potentially life-threatening illness   3. Debility secondary to Dementia progressive, will continue to monitor, follow, supportive role. Reviewed weights; PC f/u visit further discussion monitor trends of appetite, weights, monitor for functional, cognitive decline with chronic disease progression, assess any active symptoms, supportive role.   04/22/2022 weight 210 lbs 06/16/2022 weight 211.1 lbs Follow up Palliative Care Visit: PC f/u visit further discussion monitor trends of appetite, weights, monitor for functional, cognitive decline with chronic disease progression, assess any active symptoms, supportive role. At present Ms Katrina Chen is sitting in the chair in her room. Ms Katrina Chen is oriented to self. Palliative care will continue to follow for complex medical decision  making, advance care planning, and clarification of goals. Return 4-8 weeks or prn.   I spent 46 minutes providing this consultation starting at 10:00am. More than 50% of the time in this consultation was spent in counseling and care coordination.   PPS: 30% Chief Complaint: Follow up palliative consult for complex medical decision making, address goals, manage ongoing symptoms   HISTORY OF PRESENT ILLNESS:  Katrina Chen is a 87 y.o. year old adult  with multiple medical problems including Vascular dementia with behaviors, CAD, Afib, HTN, Asthma, h/o gastric polyps, cirrhosis, CKD, OA multiple sites, spinal stenosis, obesity, iron deficiency anemia, , intraocular lense, h/o posterior vitreous detachment of left eye, protein calorie malnutrition, h/o bilateral total knee replacement, bipolar disorder. Ms. Katrina Chen resides at [facility]. Ms Katrina Chen requires assistance for mobility, transfers, adl's including bathing, dressing. Ms Katrina Chen is hoyer to chair. Ms Katrina Chen requires assistance with feeding.    ED visit on 1/3 2024 for fall with workup significant for moderate to severe multilevel degenerative disc disease facet arthropathy, unchanged 1.8 cm right thyroid nodule. No other significant findings, returned to LTC.    ED visit on 04/22/2022 for fall with leg pain with workup unremarkable resulting in right knee effusion. With right knee lateral epicondyle avulsion fracture; hinged knee brace for compression, stability.   Purpose of today PC f/u visit further discussion monitor trends of appetite, weights, monitor for functional, cognitive decline with chronic disease progression, assess any active symptoms, supportive role. At present Ms Katrina Chen is sitting up in bed, makes eye contact. Ms Katrina Chen  is oriented to self. Ms Katrina Chen does make eye contact, able to say few words, though cognitively impaired, no meaningful discussion. Ms Katrina Chen did try to express some words, though limited. Recent finished covid on  06/19/2022. Ms Katrina Chen was cooperative. Supportive visit. Reviewed weights, vss, medications, goc, poc. Attempted to contact dtg, no new changes today to poc. Updated staff, no new changes recommended today; support provided.  History obtained from review of EMR, discussion with primary team, and interview with family, facility staff/caregiver and/or Ms. Katrina Chen.  I reviewed available labs, medications, imaging, studies and related documents from the EMR.  Records reviewed and summarized above.    Physical Exam: General: frail appearing, elderly, debilitated female, chronically ill ENMT: oral mucous membranes moist CV:  1 + LE edema, RRR Pulmonary:  Breath sounds decreased throughout MSK:  functional quadriplegic Neuro:  +generalized weakness,  advanced  cognitive impairment Psych: anxious affect, A and Oriented to self   Thank you for the opportunity to participate in the care of Ms. Katrina Chen. Please call our office at [phone] if we can be of additional assistance.   Brennon Otterness [place], NP

## 2022-06-27 ENCOUNTER — Inpatient Hospital Stay
Admission: EM | Admit: 2022-06-27 | Discharge: 2022-06-29 | DRG: 193 | Disposition: A | Discharge status: Skilled Nursing Facility | Payer: Medicare Other | Source: Skilled Nursing Facility | Attending: Internal Medicine | Admitting: Internal Medicine

## 2022-06-27 ENCOUNTER — Other Ambulatory Visit: Payer: Self-pay

## 2022-06-27 ENCOUNTER — Encounter: Payer: Self-pay | Admitting: Internal Medicine

## 2022-06-27 ENCOUNTER — Emergency Department: Payer: Medicare Other

## 2022-06-27 ENCOUNTER — Observation Stay: Payer: Medicare Other

## 2022-06-27 DIAGNOSIS — F0150 Vascular dementia without behavioral disturbance: Secondary | ICD-10-CM | POA: Diagnosis present

## 2022-06-27 DIAGNOSIS — E876 Hypokalemia: Secondary | ICD-10-CM | POA: Diagnosis not present

## 2022-06-27 DIAGNOSIS — E860 Dehydration: Secondary | ICD-10-CM | POA: Diagnosis present

## 2022-06-27 DIAGNOSIS — Z7901 Long term (current) use of anticoagulants: Secondary | ICD-10-CM

## 2022-06-27 DIAGNOSIS — R627 Adult failure to thrive: Secondary | ICD-10-CM | POA: Diagnosis present

## 2022-06-27 DIAGNOSIS — N179 Acute kidney failure, unspecified: Secondary | ICD-10-CM | POA: Diagnosis present

## 2022-06-27 DIAGNOSIS — E042 Nontoxic multinodular goiter: Secondary | ICD-10-CM | POA: Diagnosis present

## 2022-06-27 DIAGNOSIS — I2721 Secondary pulmonary arterial hypertension: Secondary | ICD-10-CM | POA: Diagnosis present

## 2022-06-27 DIAGNOSIS — R531 Weakness: Secondary | ICD-10-CM | POA: Diagnosis present

## 2022-06-27 DIAGNOSIS — I2510 Atherosclerotic heart disease of native coronary artery without angina pectoris: Secondary | ICD-10-CM | POA: Diagnosis present

## 2022-06-27 DIAGNOSIS — Z91013 Allergy to seafood: Secondary | ICD-10-CM

## 2022-06-27 DIAGNOSIS — Z713 Dietary counseling and surveillance: Secondary | ICD-10-CM

## 2022-06-27 DIAGNOSIS — D649 Anemia, unspecified: Secondary | ICD-10-CM | POA: Diagnosis present

## 2022-06-27 DIAGNOSIS — M25561 Pain in right knee: Secondary | ICD-10-CM | POA: Diagnosis present

## 2022-06-27 DIAGNOSIS — F419 Anxiety disorder, unspecified: Secondary | ICD-10-CM | POA: Diagnosis present

## 2022-06-27 DIAGNOSIS — Z8616 Personal history of COVID-19: Secondary | ICD-10-CM

## 2022-06-27 DIAGNOSIS — K7460 Unspecified cirrhosis of liver: Secondary | ICD-10-CM | POA: Diagnosis present

## 2022-06-27 DIAGNOSIS — E875 Hyperkalemia: Secondary | ICD-10-CM | POA: Diagnosis present

## 2022-06-27 DIAGNOSIS — I447 Left bundle-branch block, unspecified: Secondary | ICD-10-CM | POA: Diagnosis present

## 2022-06-27 DIAGNOSIS — Z515 Encounter for palliative care: Secondary | ICD-10-CM

## 2022-06-27 DIAGNOSIS — Z66 Do not resuscitate: Secondary | ICD-10-CM | POA: Diagnosis present

## 2022-06-27 DIAGNOSIS — J189 Pneumonia, unspecified organism: Principal | ICD-10-CM | POA: Diagnosis present

## 2022-06-27 DIAGNOSIS — Z888 Allergy status to other drugs, medicaments and biological substances status: Secondary | ICD-10-CM

## 2022-06-27 DIAGNOSIS — Z950 Presence of cardiac pacemaker: Secondary | ICD-10-CM

## 2022-06-27 DIAGNOSIS — R188 Other ascites: Secondary | ICD-10-CM | POA: Diagnosis present

## 2022-06-27 DIAGNOSIS — Z6835 Body mass index (BMI) 35.0-35.9, adult: Secondary | ICD-10-CM

## 2022-06-27 DIAGNOSIS — E669 Obesity, unspecified: Secondary | ICD-10-CM | POA: Diagnosis present

## 2022-06-27 DIAGNOSIS — E878 Other disorders of electrolyte and fluid balance, not elsewhere classified: Secondary | ICD-10-CM | POA: Diagnosis present

## 2022-06-27 DIAGNOSIS — D509 Iron deficiency anemia, unspecified: Secondary | ICD-10-CM | POA: Diagnosis present

## 2022-06-27 DIAGNOSIS — E871 Hypo-osmolality and hyponatremia: Secondary | ICD-10-CM | POA: Diagnosis present

## 2022-06-27 DIAGNOSIS — Z79899 Other long term (current) drug therapy: Secondary | ICD-10-CM

## 2022-06-27 DIAGNOSIS — I700 Atherosclerosis of aorta: Secondary | ICD-10-CM | POA: Diagnosis present

## 2022-06-27 DIAGNOSIS — J9601 Acute respiratory failure with hypoxia: Secondary | ICD-10-CM | POA: Diagnosis present

## 2022-06-27 DIAGNOSIS — I10 Essential (primary) hypertension: Secondary | ICD-10-CM | POA: Diagnosis present

## 2022-06-27 DIAGNOSIS — Z91041 Radiographic dye allergy status: Secondary | ICD-10-CM

## 2022-06-27 DIAGNOSIS — I482 Chronic atrial fibrillation, unspecified: Secondary | ICD-10-CM | POA: Diagnosis present

## 2022-06-27 DIAGNOSIS — E785 Hyperlipidemia, unspecified: Secondary | ICD-10-CM | POA: Diagnosis present

## 2022-06-27 LAB — BASIC METABOLIC PANEL
Anion gap: 8 (ref 5–15)
BUN: 28 mg/dL — ABNORMAL HIGH (ref 8–23)
CO2: 25 mmol/L (ref 22–32)
Calcium: 9.4 mg/dL (ref 8.9–10.3)
Chloride: 88 mmol/L — ABNORMAL LOW (ref 98–111)
Creatinine, Ser: 1.23 mg/dL — ABNORMAL HIGH (ref 0.44–1.00)
GFR, Estimated: 43 mL/min — ABNORMAL LOW (ref >60)
Glucose, Bld: 117 mg/dL — ABNORMAL HIGH (ref 70–99)
Potassium: 5.9 mmol/L — ABNORMAL HIGH (ref 3.5–5.1)
Sodium: 121 mmol/L — ABNORMAL LOW (ref 135–145)

## 2022-06-27 LAB — CBC
HCT: 31.4 % — ABNORMAL LOW (ref 36.0–46.0)
Hemoglobin: 9.5 g/dL — ABNORMAL LOW (ref 12.0–15.0)
MCH: 25.8 pg — ABNORMAL LOW (ref 26.0–34.0)
MCHC: 30.3 g/dL (ref 30.0–36.0)
MCV: 85.3 fL (ref 80.0–100.0)
Platelets: 342 10*3/uL (ref 150–400)
RBC: 3.68 MIL/uL — ABNORMAL LOW (ref 3.87–5.11)
RDW: 14.9 % (ref 11.5–15.5)
WBC: 6.8 10*3/uL (ref 4.0–10.5)
nRBC: 0 % (ref 0.0–0.2)

## 2022-06-27 LAB — MAGNESIUM: Magnesium: 2.3 mg/dL (ref 1.7–2.4)

## 2022-06-27 LAB — TYPE AND SCREEN
ABO/RH(D): A NEG
Antibody Screen: NEGATIVE

## 2022-06-27 LAB — HEPATIC FUNCTION PANEL
ALT: 13 U/L (ref 0–44)
AST: 27 U/L (ref 15–41)
Albumin: 2.8 g/dL — ABNORMAL LOW (ref 3.5–5.0)
Alkaline Phosphatase: 93 U/L (ref 38–126)
Bilirubin, Direct: 0.1 mg/dL (ref 0.0–0.2)
Indirect Bilirubin: 0.4 mg/dL (ref 0.3–0.9)
Total Bilirubin: 0.5 mg/dL (ref 0.3–1.2)
Total Protein: 7 g/dL (ref 6.5–8.1)

## 2022-06-27 LAB — PROTIME-INR
INR: 1.4 — ABNORMAL HIGH (ref 0.8–1.2)
Prothrombin Time: 16.6 seconds — ABNORMAL HIGH (ref 11.4–15.2)

## 2022-06-27 LAB — PROCALCITONIN: Procalcitonin: <0.1 ng/mL

## 2022-06-27 LAB — CK: Total CK: 69 U/L (ref 38–234)

## 2022-06-27 LAB — TROPONIN I (HIGH SENSITIVITY): Troponin I (High Sensitivity): 56 ng/L — ABNORMAL HIGH (ref <18)

## 2022-06-27 LAB — D-DIMER, QUANTITATIVE: D-Dimer, Quant: 2.82 ug/mL-FEU — ABNORMAL HIGH (ref 0.00–0.50)

## 2022-06-27 MED ORDER — SODIUM CHLORIDE 0.9 % IV SOLN: INTRAVENOUS | Status: DC

## 2022-06-27 MED ORDER — VENLAFAXINE HCL ER 75 MG PO CP24
75.0000 mg | ORAL_CAPSULE | Freq: Every day | ORAL | Status: DC
  Administered 2022-06-28 – 2022-06-29 (×2): 75 mg via ORAL
  Filled 2022-06-27 (×2): qty 1

## 2022-06-27 MED ORDER — HYDRALAZINE HCL 50 MG PO TABS
50.0000 mg | ORAL_TABLET | Freq: Three times a day (TID) | ORAL | Status: DC
  Administered 2022-06-27 – 2022-06-29 (×6): 50 mg via ORAL
  Filled 2022-06-27 (×6): qty 1

## 2022-06-27 MED ORDER — ACETAMINOPHEN 325 MG PO TABS
650.0000 mg | ORAL_TABLET | Freq: Four times a day (QID) | ORAL | Status: DC | PRN
  Administered 2022-06-29: 650 mg via ORAL
  Filled 2022-06-27: qty 2

## 2022-06-27 MED ORDER — OLANZAPINE 5 MG PO TABS
2.5000 mg | ORAL_TABLET | Freq: Every day | ORAL | Status: DC
  Administered 2022-06-27 – 2022-06-28 (×2): 2.5 mg via ORAL
  Filled 2022-06-27 (×2): qty 1

## 2022-06-27 MED ORDER — APIXABAN 2.5 MG PO TABS
2.5000 mg | ORAL_TABLET | Freq: Two times a day (BID) | ORAL | Status: DC
  Administered 2022-06-27 – 2022-06-29 (×4): 2.5 mg via ORAL
  Filled 2022-06-27 (×5): qty 1

## 2022-06-27 MED ORDER — POLYSACCHARIDE IRON COMPLEX 150 MG PO CAPS
150.0000 mg | ORAL_CAPSULE | Freq: Every day | ORAL | Status: DC
  Administered 2022-06-28 – 2022-06-29 (×2): 150 mg via ORAL
  Filled 2022-06-27 (×2): qty 1

## 2022-06-27 MED ORDER — SODIUM CHLORIDE 0.9 % IV SOLN
2.0000 g | Freq: Once | INTRAVENOUS | Status: AC
  Administered 2022-06-27: 2 g via INTRAVENOUS
  Filled 2022-06-27: qty 20

## 2022-06-27 MED ORDER — SODIUM CHLORIDE 0.9 % IV SOLN
500.0000 mg | Freq: Once | INTRAVENOUS | Status: AC
  Administered 2022-06-27: 500 mg via INTRAVENOUS
  Filled 2022-06-27: qty 5

## 2022-06-27 MED ORDER — DIVALPROEX SODIUM 125 MG PO CSDR
250.0000 mg | DELAYED_RELEASE_CAPSULE | Freq: Two times a day (BID) | ORAL | Status: DC
  Administered 2022-06-27 – 2022-06-29 (×4): 250 mg via ORAL
  Filled 2022-06-27 (×5): qty 2

## 2022-06-27 MED ORDER — FLUTICASONE PROPIONATE 50 MCG/ACT NA SUSP
1.0000 | Freq: Every day | NASAL | Status: DC
  Administered 2022-06-29: 1 via NASAL
  Filled 2022-06-27: qty 16

## 2022-06-27 MED ORDER — SODIUM CHLORIDE 0.9 % IV BOLUS
1000.0000 mL | Freq: Once | INTRAVENOUS | Status: AC
  Administered 2022-06-27: 1000 mL via INTRAVENOUS

## 2022-06-27 MED ORDER — MORPHINE SULFATE (PF) 2 MG/ML IV SOLN: 2.0000 mg | INTRAVENOUS | Status: DC | PRN

## 2022-06-27 MED ORDER — ACETAMINOPHEN 650 MG RE SUPP: 650.0000 mg | Freq: Four times a day (QID) | RECTAL | Status: DC | PRN

## 2022-06-27 MED ORDER — CYCLOSPORINE 0.05 % OP EMUL
1.0000 [drp] | Freq: Two times a day (BID) | OPHTHALMIC | Status: DC
  Administered 2022-06-28 – 2022-06-29 (×3): 1 [drp] via OPHTHALMIC
  Filled 2022-06-27 (×4): qty 30

## 2022-06-27 MED ORDER — CLONIDINE HCL 0.3 MG/24HR TD PTWK: 0.3000 mg | MEDICATED_PATCH | TRANSDERMAL | Status: DC

## 2022-06-27 MED ORDER — CARVEDILOL 25 MG PO TABS
25.0000 mg | ORAL_TABLET | Freq: Two times a day (BID) | ORAL | Status: DC
  Administered 2022-06-28 – 2022-06-29 (×2): 25 mg via ORAL
  Filled 2022-06-27 (×2): qty 1

## 2022-06-27 MED ORDER — OLOPATADINE HCL 0.1 % OP SOLN
1.0000 [drp] | Freq: Two times a day (BID) | OPHTHALMIC | Status: DC
  Administered 2022-06-29: 1 [drp] via OPHTHALMIC
  Filled 2022-06-27 (×2): qty 5

## 2022-06-27 MED ORDER — PANTOPRAZOLE SODIUM 40 MG IV SOLR
40.0000 mg | Freq: Two times a day (BID) | INTRAVENOUS | Status: DC
  Administered 2022-06-27 – 2022-06-29 (×4): 40 mg via INTRAVENOUS
  Filled 2022-06-27 (×4): qty 10

## 2022-06-27 MED ORDER — SODIUM CHLORIDE 0.9% FLUSH
3.0000 mL | Freq: Two times a day (BID) | INTRAVENOUS | Status: DC
  Administered 2022-06-27 – 2022-06-29 (×4): 3 mL via INTRAVENOUS

## 2022-06-27 NOTE — ED Provider Triage Note (Signed)
Emergency Medicine Provider Triage Evaluation Note  Katrina Chen , a 87 y.o. adult  was evaluated in triage.  Pt complains of generalized weakness x 3 days. Patient complains of right knee pain and is wearing knee brace.  Physical Exam  There were no vitals taken for this visit. Gen:   Awake, no distress   Resp:  Normal effort  MSK:   Moves extremities without difficulty  Other:    Medical Decision Making  Medically screening exam initiated at 3:54 PM.  Appropriate orders placed.  Katrina Chen was informed that the remainder of the evaluation will be completed by another provider, this initial triage assessment does not replace that evaluation, and the importance of remaining in the ED until their evaluation is complete.    Katrina Pester, FNP 06/27/22 2318

## 2022-06-27 NOTE — H&P (Addendum)
History and Physical     Patient: Katrina Chen ZOX:[id number] DOB: 1935/06/29 DOA: 06/27/2022 DOS: the patient was seen and examined on 06/28/2022 PCP: Wardell Honour, FNP   Patient coming from: SNF  Chief Complaint: Generalized weakness  HISTORY OF PRESENT ILLNESS: Katrina Chen is an 87 y.o. adult brought to the emergency room for generalized weakness for the past few days.  Patient has not been eating and drinking.  Patient also has a cough.  While examining patient reports abdominal pain while pressing on her abdomen not otherwise.  HPI is difficult to obtain from the patient.  Past Medical History:  Diagnosis Date   A-fib    Anxiety    Chronic kidney disease    Depression    Hyperlipidemia    Hypertension    Iron deficiency    Vascular dementia    Review of Systems  Unable to perform ROS: Age  Respiratory:  Positive for cough.   Neurological:  Positive for weakness.   Allergies  Allergen Reactions   Iodine Swelling   Amlodipine Other (See Comments)   Nifedipine Swelling and Other (See Comments)   Iodinated Contrast Media    Lisinopril Swelling   Quinapril Swelling   Shellfish Allergy    Simvastatin Swelling   Past Surgical History:  Procedure Laterality Date   ESOPHAGOGASTRODUODENOSCOPY (EGD) WITH PROPOFOL N/A 07/01/2021   Procedure: ESOPHAGOGASTRODUODENOSCOPY (EGD) WITH PROPOFOL;  Surgeon: Wyline Mood, MD;  Location: [facility] ENDOSCOPY;  Service: Gastroenterology;  Laterality: N/A;   ESOPHAGOGASTRODUODENOSCOPY (EGD) WITH PROPOFOL N/A 07/27/2021   Procedure: ESOPHAGOGASTRODUODENOSCOPY (EGD) WITH PROPOFOL;  Surgeon: Toney Reil, MD;  Location: [facility] ENDOSCOPY;  Service: Gastroenterology;  Laterality: N/A;   PACEMAKER IMPLANT     MEDICATIONS: Prior to Admission medications   Medication Sig Start Date End Date Taking? Authorizing Provider  alum & mag hydroxide-simeth (ALUMINA-MAGNESIA-SIMETHICONE) 200-200-20 MG/5ML suspension Take 15 mLs by mouth every 6 (six)  hours as needed for indigestion or heartburn.    [provider]  apixaban (ELIQUIS) 2.5 MG TABS tablet Take 2.5 mg by mouth 2 (two) times daily.    [provider]  bisacodyl (DULCOLAX) 10 MG suppository Place 1 suppository (10 mg total) rectally daily as needed for mild constipation. 11/08/20   Tresa Moore, MD  carvedilol (COREG) 25 MG tablet Take 25 mg by mouth 2 (two) times daily with a meal.    [provider]  cloNIDine (CATAPRES - DOSED IN MG/24 HR) 0.3 mg/24hr patch Place 0.3 mg onto the skin every Friday.    [provider]  cycloSPORINE (RESTASIS) 0.05 % ophthalmic emulsion Place 1 drop into both eyes 2 (two) times daily. 05/26/21   [provider]  divalproex (DEPAKOTE SPRINKLE) 125 MG capsule Take 125 mg by mouth 2 (two) times daily. 02/18/21   [provider]  fluticasone (FLONASE) 50 MCG/ACT nasal spray Place 1 spray into both nostrils daily.    [provider]  hydrALAZINE (APRESOLINE) 50 MG tablet Take 1 tablet (50 mg total) by mouth every 8 (eight) hours. 10/28/21   Alford Highland, MD  iron polysaccharides (NIFEREX) 150 MG capsule Take 150 mg by mouth daily.    [provider]  levocetirizine (XYZAL) 5 MG tablet Take 5 mg by mouth daily. 06/11/21   [provider]  losartan (COZAAR) 100 MG tablet Take 100 mg by mouth daily.    [provider]  melatonin 5 MG TABS Take 10 mg by mouth at bedtime.    [provider]  meloxicam (MOBIC) 7.5 MG tablet Take 7.5 mg by mouth daily. 10/17/21   [provider]  OLANZapine (ZYPREXA) 2.5 MG tablet Take 2.5 mg by mouth at bedtime.    [provider]  olopatadine (PATANOL) 0.1 % ophthalmic solution Place 1 drop into both eyes 2 (two) times daily.    [provider]  omeprazole (PRILOSEC) 20 MG capsule Take 20 mg by mouth daily.    [provider]  ondansetron (ZOFRAN-ODT) 4 MG disintegrating tablet Take 4 mg by  mouth every 8 (eight) hours as needed for vomiting or nausea.    [provider]  polyethylene glycol (MIRALAX / GLYCOLAX) 17 g packet Take 17 g by mouth 2 (two) times daily. Patient taking differently: Take 17 g by mouth daily. 11/16/20   Burnadette Pop, MD  prazosin (MINIPRESS) 1 MG capsule Take 1 mg by mouth at bedtime.    [provider]  senna-docusate (SENOKOT-S) 8.6-50 MG tablet Take 1 tablet by mouth 2 (two) times daily. Patient taking differently: Take 2 tablets by mouth 2 (two) times daily. 11/08/20   Tresa Moore, MD  simethicone (MYLICON) 80 MG chewable tablet Chew 80 mg by mouth 3 (three) times daily as needed for flatulence.    [provider]  traMADol (ULTRAM) 50 MG tablet Take 1 tablet (50 mg total) by mouth every 12 (twelve) hours as needed for moderate pain. 10/28/21   Alford Highland, MD  venlafaxine XR (EFFEXOR-XR) 75 MG 24 hr capsule Take 75 mg by mouth daily. 06/20/21   [provider]   ED Course: Pt in Ed patient in the ED is ill-appearing in no distress but is alert cooperative is coughing somewhat confused her O2 sats are dropping to 88 and 87%.  Vitals:   06/27/22 1922 06/27/22 2000 06/27/22 2101 06/28/22 0000  BP:  (!) 195/62 (!) 193/73 (!) 143/65  Pulse:  (!) 59 60 61  Resp:  18 18 16   Temp: 98.4 F (36.9 C)  98.5 F (36.9 C) 98.6 F (37 C)  TempSrc: Oral     SpO2:  98% 99% 100%  Weight:       No intake/output data recorded. SpO2: 100 % EKG shows a.fib /V paced rhythm.      Blood work in ed shows: BMP shows hyponatremia of 121 chloride 88, potassium of 5.9,glucose 117 AKI with a creatinine of 1.23 magnesium of 2.3. CPK of 69.  D-dimer of 2.82. CBC shows a normal white count of 6.8 hemoglobin of 9.5 platelets of 342. The emergency room patient received 1 L of normal saline. In ed chest xray shows: 1. Ill-defined density in the right mid lung measuring 4.7 x 2.1 cm which could be from pneumonia, atelectasis,  or fluid along the minor fissure. Given the cough and airway thickening, pneumonia is favored. Followup PA and lateral chest X-ray is recommended in 3-4 weeks following trial of antibiotic therapy to ensure resolution and exclude underlying malignancy. 2. Mild enlargement of the cardiopericardial silhouette. 3. Leadless pacer noted. 4. Atherosclerotic calcification of the aortic arch.  Results for orders placed or performed during the hospital encounter of 06/27/22 (from the past 72 hour(s))  Basic metabolic panel     Status: Abnormal   Collection Time: 06/27/22  4:01 PM  Result Value Ref Range   Sodium 121 (L) 135 - 145 mmol/L   Potassium 5.9 (H) 3.5 - 5.1 mmol/L   Chloride 88 (L) 98 - 111 mmol/L   CO2 25 22 - 32  mmol/L   Glucose, Bld 117 (H) 70 - 99 mg/dL    Comment: Glucose reference range applies only to samples taken after fasting for at least 8 hours.   BUN 28 (H) 8 - 23 mg/dL   Creatinine, Ser 1.61 (H) 0.44 - 1.00 mg/dL   Calcium 9.4 8.9 - 09.6 mg/dL   GFR, Estimated 43 (L) >60 mL/min    Comment: (NOTE) Calculated using the CKD-EPI Creatinine Equation (2021)    Anion gap 8 5 - 15    Comment: Performed at [facility], [address] [place], [place] [address]  CBC     Status: Abnormal   Collection Time: 06/27/22  4:01 PM  Result Value Ref Range   WBC 6.8 4.0 - 10.5 K/uL   RBC 3.68 (L) 3.87 - 5.11 MIL/uL   Hemoglobin 9.5 (L) 12.0 - 15.0 g/dL   HCT 98.1 (L) 19.1 - 47.8 %   MCV 85.3 80.0 - 100.0 fL   MCH 25.8 (L) 26.0 - 34.0 pg   MCHC 30.3 30.0 - 36.0 g/dL   RDW 29.5 62.1 - 30.8 %   Platelets 342 150 - 400 K/uL   nRBC 0.0 0.0 - 0.2 %    Comment: Performed at [facility], [address] [place], [place] [address]  Procalcitonin     Status: None   Collection Time: 06/27/22  7:02 PM  Result Value Ref Range   Procalcitonin <0.10 ng/mL    Comment:        Interpretation: PCT (Procalcitonin) <= 0.5 ng/mL: Systemic infection (sepsis) is not  likely. Local bacterial infection is possible. (NOTE)       Sepsis PCT Algorithm           Lower Respiratory Tract                                      Infection PCT Algorithm    ----------------------------     ----------------------------         PCT < 0.25 ng/mL                PCT < 0.10 ng/mL          Strongly encourage             Strongly discourage   discontinuation of antibiotics    initiation of antibiotics    ----------------------------     -----------------------------       PCT 0.25 - 0.50 ng/mL            PCT 0.10 - 0.25 ng/mL               OR       >80% decrease in PCT            Discourage initiation of                                            antibiotics      Encourage discontinuation           of antibiotics    ----------------------------     -----------------------------         PCT >= 0.50 ng/mL              PCT 0.26 - 0.50 ng/mL  AND        <80% decrease in PCT             Encourage initiation of                                             antibiotics       Encourage continuation           of antibiotics    ----------------------------     -----------------------------        PCT >= 0.50 ng/mL                  PCT > 0.50 ng/mL               AND         increase in PCT                  Strongly encourage                                      initiation of antibiotics    Strongly encourage escalation           of antibiotics                                     -----------------------------                                           PCT <= 0.25 ng/mL                                                 OR                                        > 80% decrease in PCT                                      Discontinue / Do not initiate                                             antibiotics  Performed at [facility], [address] [place], [place] [address]   Protime-INR     Status: Abnormal   Collection Time: 06/27/22  7:02 PM  Result  Value Ref Range   Prothrombin Time 16.6 (H) 11.4 - 15.2 seconds   INR 1.4 (H) 0.8 - 1.2    Comment: (NOTE) INR goal varies based on device and disease states. Performed at [facility], [address] [place], [place] [address]   Hepatic function panel     Status: Abnormal   Collection Time: 06/27/22  7:02 PM  Result  Value Ref Range   Total Protein 7.0 6.5 - 8.1 g/dL   Albumin 2.8 (L) 3.5 - 5.0 g/dL   AST 27 15 - 41 U/L   ALT 13 0 - 44 U/L   Alkaline Phosphatase 93 38 - 126 U/L   Total Bilirubin 0.5 0.3 - 1.2 mg/dL   Bilirubin, Direct 0.1 0.0 - 0.2 mg/dL   Indirect Bilirubin 0.4 0.3 - 0.9 mg/dL    Comment: Performed at [facility], [address] [place], [place] [address]  D-dimer, quantitative     Status: Abnormal   Collection Time: 06/27/22  7:05 PM  Result Value Ref Range   D-Dimer, Quant 2.82 (H) 0.00 - 0.50 ug/mL-FEU    Comment: (NOTE) At the manufacturer cut-off value of 0.5 g/mL FEU, this assay has a negative predictive value of 95-100%.This assay is intended for use in conjunction with a clinical pretest probability (PTP) assessment model to exclude pulmonary embolism (PE) and deep venous thrombosis (DVT) in outpatients suspected of PE or DVT. Results should be correlated with clinical presentation. Performed at [facility], [address] [place], [place] [address]   CK     Status: None   Collection Time: 06/27/22  7:05 PM  Result Value Ref Range   Total CK 69 38 - 234 U/L    Comment: Performed at [facility], [address] [place], [place] [address]  Type and screen     Status: None   Collection Time: 06/27/22  7:05 PM  Result Value Ref Range   ABO/RH(D) A NEG    Antibody Screen NEG    Sample Expiration      06/30/2022,2359 Performed at [facility], [address] [place], [place] [address]   Magnesium     Status: None   Collection Time: 06/27/22  7:05 PM  Result Value Ref Range   Magnesium 2.3  1.7 - 2.4 mg/dL    Comment: Performed at [facility], [address] [place], [place] [address]  Troponin I (High Sensitivity)     Status: Abnormal   Collection Time: 06/27/22  9:39 PM  Result Value Ref Range   Troponin I (High Sensitivity) 56 (H) <18 ng/L    Comment: (NOTE) Elevated high sensitivity troponin I (hsTnI) values and significant  changes across serial measurements may suggest ACS but many other  chronic and acute conditions are known to elevate hsTnI results.  Refer to the "Links" section for chest pain algorithms and additional  guidance. Performed at [facility], [address] [place], [place] [address]   Troponin I (High Sensitivity)     Status: Abnormal   Collection Time: 06/27/22 11:51 PM  Result Value Ref Range   Troponin I (High Sensitivity) 51 (H) <18 ng/L    Comment: (NOTE) Elevated high sensitivity troponin I (hsTnI) values and significant  changes across serial measurements may suggest ACS but many other  chronic and acute conditions are known to elevate hsTnI results.  Refer to the "Links" section for chest pain algorithms and additional  guidance. Performed at [facility], [address] [place], [place] [address]     Lab Results  Component Value Date   CREATININE 1.23 (H) 06/27/2022   CREATININE 1.00 04/22/2022   CREATININE 1.26 (H) 01/13/2022      Latest Ref Rng & Units 06/27/2022    7:02 PM 06/27/2022    4:01 PM 04/22/2022    3:07 PM  CMP  Glucose 70 - 99  mg/dL  161  92   BUN 8 - 23 mg/dL  28  16   Creatinine 0.96 - 1.00 mg/dL  0.45  4.09   Sodium 811 - 145 mmol/L  121  132   Potassium 3.5 - 5.1 mmol/L  5.9  5.0   Chloride 98 - 111 mmol/L  88  96   CO2 22 - 32 mmol/L  25  28   Calcium 8.9 - 10.3 mg/dL  9.4  9.8   Total Protein 6.5 - 8.1 g/dL 7.0   7.0   Total Bilirubin 0.3 - 1.2 mg/dL 0.5   0.5   Alkaline Phos 38 - 126 U/L 93   64   AST 15 - 41 U/L 27   18   ALT 0 - 44 U/L 13   9    Unresulted Labs  (From admission, onward)     Start     Ordered   06/28/22 0500  Comprehensive metabolic panel  Tomorrow morning,   STAT        06/27/22 1923   06/28/22 0500  CBC  Tomorrow morning,   STAT        06/27/22 1923   06/27/22 1823  Urinalysis, w/ Reflex to Culture (Infection Suspected) -Urine, Clean Catch  Once,   URGENT       Question:  Specimen Source  Answer:  Urine, Clean Catch   06/27/22 1822           Pt has received : Orders Placed This Encounter  Procedures   DG Chest Portable 1 View    Standing Status:   Standing    Number of Occurrences:   1    Order Specific Question:   Reason for Exam (SYMPTOM  OR DIAGNOSIS REQUIRED)    Answer:   cough, weakness   CT CHEST WO CONTRAST    Standing Status:   Standing    Number of Occurrences:   1   Basic metabolic panel    Standing Status:   Standing    Number of Occurrences:   1   CBC    Standing Status:   Standing    Number of Occurrences:   1   Urinalysis, w/ Reflex to Culture (Infection Suspected) -Urine, Clean Catch    Standing Status:   Standing    Number of Occurrences:   1    Order Specific Question:   Specimen Source    Answer:   Urine, Clean Catch [76]   D-dimer, quantitative    Standing Status:   Standing    Number of Occurrences:   1   CK    Standing Status:   Standing    Number of Occurrences:   1   Magnesium    Standing Status:   Standing    Number of Occurrences:   1   Comprehensive metabolic panel    Standing Status:   Standing    Number of Occurrences:   1   CBC    Standing Status:   Standing    Number of Occurrences:   1   Procalcitonin    Standing Status:   Standing    Number of Occurrences:   1   Protime-INR    Standing Status:   Standing    Number of Occurrences:   1   Hepatic function panel    Standing Status:   Standing    Number of Occurrences:   1   Diet NPO time specified    Standing  Status:   Standing    Number of Occurrences:   1   Document Height and Actual Weight    Use scales to  weigh patient, not stated or estimated weight.    Standing Status:   Standing    Number of Occurrences:   1   Maintain IV access    Standing Status:   Standing    Number of Occurrences:   1   Vital signs    Standing Status:   Standing    Number of Occurrences:   1   Notify physician (specify)    Standing Status:   Standing    Number of Occurrences:   20    Order Specific Question:   Notify Physician    Answer:   for pulse less than 55 or greater than 120    Order Specific Question:   Notify Physician    Answer:   for respiratory rate less than 12 or greater than 25    Order Specific Question:   Notify Physician    Answer:   for temperature greater than 100.5 F    Order Specific Question:   Notify Physician    Answer:   for urinary output less than 30 mL/hr for four hours    Order Specific Question:   Notify Physician    Answer:   for systolic BP less than 90 or greater than 160, diastolic BP less than 60 or greater than 100    Order Specific Question:   Notify Physician    Answer:   for new hypoxia w/ oxygen saturations < 88%   Progressive Mobility Protocol: No Restrictions    Standing Status:   Standing    Number of Occurrences:   1   Daily weights    Standing Status:   Standing    Number of Occurrences:   1   Intake and Output    Standing Status:   Standing    Number of Occurrences:   1   Do not place and if present remove PureWick    Standing Status:   Standing    Number of Occurrences:   1   Initiate Oral Care Protocol    Standing Status:   Standing    Number of Occurrences:   1   Initiate Carrier Fluid Protocol    Standing Status:   Standing    Number of Occurrences:   1   RN may order General Admission PRN Orders utilizing "General Admission PRN medications" (through manage orders) for the following patient needs: allergy symptoms (Claritin), cold sores (Carmex), cough (Robitussin DM), eye irritation (Liquifilm Tears), hemorrhoids (Tucks), indigestion (Maalox), minor  skin irritation (Hydrocortisone Cream), muscle pain (Ben Gay), nose irritation (saline nasal spray) and sore throat (Chloraseptic spray).    Standing Status:   Standing    Number of Occurrences:   [id number]   Cardiac Monitoring Continuous x 48 hours Indications for use: Other; Other indications for use: electrolte abnormality    Standing Status:   Standing    Number of Occurrences:   1    Order Specific Question:   Indications for use:    Answer:   Other    Order Specific Question:   Other indications for use:    Answer:   electrolte abnormality   Swallow screen    Standing Status:   Standing    Number of Occurrences:   1   Do not attempt resuscitation (DNR)    Standing Status:   Standing  Number of Occurrences:   1    Order Specific Question:   If patient has no pulse and is not breathing    Answer:   Do Not Attempt Resuscitation    Order Specific Question:   If patient has a pulse and/or is breathing: Medical Treatment Goals    Answer:   LIMITED ADDITIONAL INTERVENTIONS: Use medication/IV fluids and cardiac monitoring as indicated; Do not use intubation or mechanical ventilation (DNI), also provide comfort medications.  Transfer to Progressive/Stepdown as indicated, avoid Intensive Care.    Order Specific Question:   Consent:    Answer:   Discussion documented in EHR or advanced directives reviewed   Consult to hospitalist    Standing Status:   Standing    Number of Occurrences:   1    Order Specific Question:   Place call to:    Answer:   ED, [phone]    Order Specific Question:   Reason for Consult    Answer:   Admit    Order Specific Question:   Diagnosis/Clinical Info for Consult:    Answer:   hyponatremia   Pulse oximetry check with vital signs    Standing Status:   Standing    Number of Occurrences:   1   Oxygen therapy Mode or (Route): Nasal cannula; Liters Per Minute: 2; Keep 02 saturation: greater than 92 %    Standing Status:   Standing    Number of Occurrences:   20     Order Specific Question:   Mode or (Route)    Answer:   Nasal cannula    Order Specific Question:   Liters Per Minute    Answer:   2    Order Specific Question:   Keep 02 saturation    Answer:   greater than 92 %   ED EKG    Altered mental status    Standing Status:   Standing    Number of Occurrences:   1    Order Specific Question:   Reason for Exam    Answer:   Other (See Comments)   EKG 12-Lead    Standing Status:   Standing    Number of Occurrences:   1   Type and screen    Standing Status:   Standing    Number of Occurrences:   1   Saline lock IV    Standing Status:   Standing    Number of Occurrences:   1   Place in observation (patient's expected length of stay will be less than 2 midnights)    Standing Status:   Standing    Number of Occurrences:   1    Order Specific Question:   Hospital Area    Answer:   [facility] [[id number]]    Order Specific Question:   Level of Care    Answer:   Telemetry Medical [104]    Order Specific Question:   Covid Evaluation    Answer:   Asymptomatic - no recent exposure (last 10 days) testing not required    Order Specific Question:   Diagnosis    Answer:   Generalized weakness [272536]    Order Specific Question:   Admitting Physician    Answer:   Darrold Junker    Order Specific Question:   Attending Physician    Answer:   Gertha Calkin [[id number]]   Aspiration precautions    Standing Status:   Standing    Number of  Occurrences:   1   Fall precautions    Standing Status:   Standing    Number of Occurrences:   1    Meds ordered this encounter  Medications   sodium chloride 0.9 % bolus 1,000 mL   pantoprazole (PROTONIX) injection 40 mg   apixaban (ELIQUIS) tablet 2.5 mg   carvedilol (COREG) tablet 25 mg   DISCONTD: cloNIDine (CATAPRES - Dosed in mg/24 hr) patch 0.3 mg   divalproex (DEPAKOTE SPRINKLE) capsule 250 mg   OLANZapine (ZYPREXA) tablet 2.5 mg   venlafaxine XR (EFFEXOR-XR) 24 hr capsule 75 mg    sodium chloride flush (NS) 0.9 % injection 3 mL   0.9 %  sodium chloride infusion   OR Linked Order Group    acetaminophen (TYLENOL) tablet 650 mg    acetaminophen (TYLENOL) suppository 650 mg   morphine (PF) 2 MG/ML injection 2 mg   cefTRIAXone (ROCEPHIN) 2 g in sodium chloride 0.9 % 100 mL IVPB    Order Specific Question:   Antibiotic Indication:    Answer:   CAP   azithromycin (ZITHROMAX) 500 mg in sodium chloride 0.9 % 250 mL IVPB   cycloSPORINE (RESTASIS) 0.05 % ophthalmic emulsion 1 drop   fluticasone (FLONASE) 50 MCG/ACT nasal spray 1 spray   hydrALAZINE (APRESOLINE) tablet 50 mg   iron polysaccharides (NIFEREX) capsule 150 mg   olopatadine (PATANOL) 0.1 % ophthalmic solution 1 drop   cefTRIAXone (ROCEPHIN) 2 g in sodium chloride 0.9 % 100 mL IVPB    Order Specific Question:   Antibiotic Indication:    Answer:   CAP   azithromycin (ZITHROMAX) 500 mg in sodium chloride 0.9 % 250 mL IVPB    Admission Imaging : CT CHEST WO CONTRAST  Result Date: 06/27/2022 CLINICAL DATA:  Respiratory failure and increased weakness. EXAM: CT CHEST WITHOUT CONTRAST TECHNIQUE: Multidetector CT imaging of the chest was performed following the standard protocol without IV contrast. RADIATION DOSE REDUCTION: This exam was performed according to the departmental dose-optimization program which includes automated exposure control, adjustment of the mA and/or kV according to patient size and/or use of iterative reconstruction technique. COMPARISON:  Portable chest today, chest CT without contrast 04/13/2022, and chest CTs without contrast 11/11/2021 and 10/26/2021. 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 Leadless pacemaker again noted in the apex of the right ventricle. There is heavy aortic calcific plaque beginning in the arch with patchy calcification in the great vessels. No aortic aneurysm. The pulmonary trunk is 3.4 cm  indicating arterial hypertension. This was seen previously. Pulmonary veins are normal caliber. There is no pericardial effusion. Mediastinum/Nodes: Patulous esophagus with scattered retained fluid. No focal wall thickening. Small amount of fluid layering in the distal trachea, with extension of what are believed to be mucoid secretions into the proximal left main bronchus. The right main bronchus is patent. Heterogeneous multinodular thyroid, largest individual nodule 1.7 cm in the right lobe is again noted. No record of prior thyroid ultrasound. Recommend thyroid ultrasound if clinically warranted taking into account advanced age. There are clustered mildly prominent azygoesophageal recess lymph nodes up to 1 cm in short axis, a few similar sized subcarinal and right paratracheal lymph nodes, but no further intrathoracic adenopathy. There is no worsening of the pre-existing adenopathy. Please note hilar adenopathy is difficult to assess without contrast Lungs/Pleura: Interval new layering small left and minimal right pleural effusions. There is increased consolidation posteriorly in the left upper lobe extending into the dorsal  lingular base, and increased consolidation in the superior segments of both lower lobes. Findings most likely due to multilobar pneumonic consolidation with additional patchy hazy airspace disease in the posterior basal left lower lobe. Linear atelectasis continues to be seen in the right lower lobe. Background mild emphysematous disease with centrilobular changes predominating. No other focal infiltrate or pulmonary nodule is seen. Diffuse bronchial thickening has increased, more so in the lower lobes with scattered subsegmental bronchial/bronchiolar plugging in both lower lobes, query mucous plugging or aspiration. This is greater in the left lower lobe than the right. The right upper and middle lobes are clear. There is no pneumothorax. Upper Abdomen: Chronic nodular hepatic contour  consistent with cirrhosis. No splenomegaly. Increased small volume of perihepatic and perisplenic upper abdominal ascites. No upper abdominal free air. Mild elevation right diaphragm. Musculoskeletal: Osteopenia, thoracic kyphosis and diffuse degenerative disc disease thoracic spine. There is no acute compression fracture. No destructive bone lesions. The ribcage is intact. IMPRESSION: 1. Cardiomegaly with calcific CAD and leadless intraventricular pacemaker. 2. Prominent pulmonary trunk in keeping with pulmonary arterial hypertension. 3. Interval new small left and minimal right pleural effusions. 4. Increased consolidation posteriorly in the left upper lobe extending into the dorsal lingular base, and increased consolidation in the superior segments of both lower lobes. Findings most likely due to multilobar pneumonic consolidation with additional patchy hazy airspace disease in the posterior basal left lower lobe. Aspiration etiology not excluded. 5. Diffuse bronchial thickening with scattered subsegmental bronchial/bronchiolar plugging in both lower lobes, left greater than right, query mucous plugging or aspiration. 6. Patulous esophagus with scattered retained fluid. 7. Chronic nodular liver contour consistent with cirrhosis. 8. Increased small volume of upper abdominal ascites. 9. Multinodular thyroid, largest nodule 1.7 cm in the right lobe. No record of prior thyroid ultrasound. Recommend thyroid ultrasound if clinically warranted taking into account advanced age. 10. Aortic and coronary artery atherosclerosis. Electronically Signed   By: Almira Bar M.D.   On: 06/27/2022 20:27   DG Chest Portable 1 View  Result Date: 06/27/2022 CLINICAL DATA:  Cough and weakness EXAM: PORTABLE CHEST 1 VIEW COMPARISON:  01/13/2022 FINDINGS: Leadless pacer noted. Atherosclerotic calcification of the aortic arch. Mild enlargement of the cardiopericardial silhouette. Airway thickening, right greater than left, along  with ill-defined density in the right mid lung measuring about 4.7 by 2.1 cm which could be from pneumonia, atelectasis, or fluid along the minor fissure. No blunting of the costophrenic angles. IMPRESSION: 1. Ill-defined density in the right mid lung measuring 4.7 x 2.1 cm which could be from pneumonia, atelectasis, or fluid along the minor fissure. Given the cough and airway thickening, pneumonia is favored. Followup PA and lateral chest X-ray is recommended in 3-4 weeks following trial of antibiotic therapy to ensure resolution and exclude underlying malignancy. 2. Mild enlargement of the cardiopericardial silhouette. 3. Leadless pacer noted. 4. Atherosclerotic calcification of the aortic arch. Electronically Signed   By: Gaylyn Rong M.D.   On: 06/27/2022 19:14   Physical Examination: Vitals:   06/27/22 1922 06/27/22 2000 06/27/22 2101 06/28/22 0000  BP:  (!) 195/62 (!) 193/73 (!) 143/65  Pulse:  (!) 59 60 61  Temp: 98.4 F (36.9 C)  98.5 F (36.9 C) 98.6 F (37 C)  Resp:  18 18 16   Weight:      SpO2:  98% 99% 100%  TempSrc: Oral      Physical Exam Vitals and nursing note reviewed.  Constitutional:      General: She is  not in acute distress.    Appearance: She is ill-appearing. She is not toxic-appearing or diaphoretic.  HENT:     Head: Normocephalic and atraumatic.     Right Ear: Hearing and external ear normal.     Left Ear: Hearing and external ear normal.     Nose: Nose normal. No nasal deformity.     Mouth/Throat:     Lips: Pink.     Mouth: Mucous membranes are moist.     Tongue: No lesions.     Pharynx: Oropharynx is clear.  Eyes:     Extraocular Movements: Extraocular movements intact.  Cardiovascular:     Rate and Rhythm: Normal rate and regular rhythm.     Pulses: Normal pulses.  Pulmonary:     Effort: Pulmonary effort is normal.     Breath sounds: Wheezing and rales present.  Abdominal:     General: Bowel sounds are normal. There is no distension.      Palpations: Abdomen is soft. There is no mass.     Tenderness: There is no abdominal tenderness. There is no guarding.     Hernia: No hernia is present.  Musculoskeletal:     Right lower leg: No edema.     Left lower leg: No edema.  Skin:    General: Skin is warm.  Neurological:     General: No focal deficit present.     Mental Status: She is alert and oriented to person, place, and time.     Cranial Nerves: Cranial nerves 2-12 are intact.     Motor: Motor function is intact.  Psychiatric:        Attention and Perception: Attention normal.        Mood and Affect: Mood normal.        Speech: Speech normal.        Behavior: Behavior normal. Behavior is cooperative.        Cognition and Memory: Cognition normal.     Assessment and Plan: * Generalized weakness Multifactorial due to electrolytes. Chronic medical condition not limiting to her iron deficiency anemia /and aging . Evaluated identify and manage and PT consult prior to discharge.   Acute respiratory failure with hypoxia SpO2: 94 % D/w nurse to start pt on 2 L Oxygen as her oxygen was dropping to 87% on RA.  Her auscultation is abnormal and pt has rales diffusely BL. Chest xray is abnormal and we will start pt on supplemental oxygen and obtain ct chest.  1. Ill-defined density in the right mid lung measuring 4.7 x 2.1 cm which could be from pneumonia, atelectasis, or fluid along the minor fissure. Given the cough and airway thickening, pneumonia is favored. Followup PA and lateral chest X-ray is recommended in 3-4 weeks following trial of antibiotic therapy to ensure resolution and exclude underlying malignancy. 2. Mild enlargement of the cardiopericardial silhouette. 3. Leadless pacer noted. 4. Atherosclerotic calcification of the aortic arch. Chest ct shows PNA / pleural effusion,:  IMPRESSION: 1. Cardiomegaly with calcific CAD and leadless intraventricular pacemaker. 2. Prominent pulmonary trunk in keeping with  pulmonary arterial hypertension. 3. Interval new small left and minimal right pleural effusions. 4. Increased consolidation posteriorly in the left upper lobe extending into the dorsal lingular base, and increased consolidation in the superior segments of both lower lobes. Findings most likely due to multilobar pneumonic consolidation with additional patchy hazy airspace disease in the posterior basal left lower lobe. Aspiration etiology not excluded. 5. Diffuse bronchial thickening with scattered subsegmental  bronchial/bronchiolar plugging in both lower lobes, left greater than right, query mucous plugging or aspiration. 6. Patulous esophagus with scattered retained fluid. 7. Chronic nodular liver contour consistent with cirrhosis. 8. Increased small volume of upper abdominal ascites. 9. Multinodular thyroid, largest nodule 1.7 cm in the right lobe. No record of prior thyroid ultrasound. Recommend thyroid ultrasound if clinically warranted taking into account advanced age. 10. Aortic and coronary artery atherosclerosis.    We will continue oxygen/ iv antibiotics.    Atrial fibrillation, chronic EKG shows v paced rhythm.  Continue eliquis and coreg.    Essential hypertension Vitals:   06/27/22 1557 06/27/22 1900  BP: (!) 155/57 (!) 198/65  Prn Hydralazine.  Cont coreg/ hydralazine / clonidine hold losartan due to AKI.     Electrolyte abnormality Pt acutely hyponatremic due to decrease po intake.  We will cont with slow MIVF with NS.    Anemia    Latest Ref Rng & Units 06/27/2022    4:01 PM 04/22/2022    3:07 PM 01/13/2022    6:20 PM  CBC  WBC 4.0 - 10.5 K/uL 6.8  6.3  6.5   Hemoglobin 12.0 - 15.0 g/dL 9.5  16.1  09.6   Hematocrit 36.0 - 46.0 % 31.4  35.5  39.3   Platelets 150 - 400 K/uL 342  226  281   Pt is getting iron. IV PPI. D/c meloxicam.  Cont eliquis.    Iron deficiency anemia    Latest Ref Rng & Units 06/27/2022    4:01 PM 04/22/2022    3:07 PM  01/13/2022    6:20 PM  CBC  WBC 4.0 - 10.5 K/uL 6.8  6.3  6.5   Hemoglobin 12.0 - 15.0 g/dL 9.5  04.5  40.9   Hematocrit 36.0 - 46.0 % 31.4  35.5  39.3   Platelets 150 - 400 K/uL 342  226  281   We will type and screen. IV PPI.  D/c meloxicam.  Avoid nsaids.     Cirrhosis We will obtain LFT / PT/INR.    CAD (coronary artery disease) Pt is not able to give history due to age and dementia.  We will obtain troponin.  Cont eliquis and coreg.          DVT prophylaxis:  Eliquis.  Code Status:  DNR.     06/27/2022   11:00 PM  Advanced Directives  Does Patient Have a Medical Advance Directive? Yes    Family Communication:  None. Emergency Contact: Contact Information     Name Relation Home Work Mobile   Kimball,Cynthia Daughter   [phone]       Disposition Plan:  TBD.  Consults: None. Admission status: Inpatient.  Unit / Expected LOS: Med tele.  Gertha Calkin MD [facility]  6 PM- 2 AM. [phone]( Pager )  For questions regarding this patient please use [email or web address] to contact the current [facility] MD.   Bonita Quin may also call [phone] to contact current Assigned [facility] Attending/Consulting MD for this patient.

## 2022-06-27 NOTE — Hospital Course (Signed)
Admission request for 87 y/o loss of appetite and cough.  Not eating or drinking and electrolyte are abnormal.  Baseline mental status/covid a month ago.pt is coming from [facility].

## 2022-06-27 NOTE — ED Triage Notes (Signed)
Pt to ED [facility] from [facility] for generalized weakness for past few days. Facility reports decreased intake and output. Pt alert. Reports right knee pain, pt wearing brace to right knee. Rotation noted to left foot, ems states family said this is normal.

## 2022-06-27 NOTE — ED Notes (Signed)
Patient transported to CT 

## 2022-06-27 NOTE — Assessment & Plan Note (Signed)
We will obtain LFT / PT/INR.

## 2022-06-27 NOTE — Assessment & Plan Note (Signed)
Pt acutely hyponatremic due to decrease po intake.  We will cont with slow MIVF with NS.

## 2022-06-27 NOTE — Assessment & Plan Note (Signed)
    Latest Ref Rng & Units 06/27/2022    4:01 PM 04/22/2022    3:07 PM 01/13/2022    6:20 PM  CBC  WBC 4.0 - 10.5 K/uL 6.8  6.3  6.5   Hemoglobin 12.0 - 15.0 g/dL 9.5  11.9  14.7   Hematocrit 36.0 - 46.0 % 31.4  35.5  39.3   Platelets 150 - 400 K/uL 342  226  281   We will type and screen. IV PPI.  D/c meloxicam.  Avoid nsaids.

## 2022-06-27 NOTE — Assessment & Plan Note (Addendum)
Vitals:   06/27/22 1557 06/27/22 1900  BP: (!) 155/57 (!) 198/65  Prn Hydralazine.  Cont coreg/ hydralazine / clonidine hold losartan due to AKI.

## 2022-06-27 NOTE — ED Notes (Addendum)
Patient returned from CT

## 2022-06-27 NOTE — Assessment & Plan Note (Addendum)
Multifactorial due to electrolytes. Chronic medical condition not limiting to her iron deficiency anemia /and aging . Evaluated identify and manage and PT consult prior to discharge.

## 2022-06-27 NOTE — ED Notes (Signed)
Patient placed on [facility] at the request of Dr. Allena Katz.

## 2022-06-27 NOTE — Assessment & Plan Note (Addendum)
SpO2: 94 % D/w nurse to start pt on 2 L Oxygen as her oxygen was dropping to 87% on RA.  Her auscultation is abnormal and pt has rales diffusely BL. Chest xray is abnormal and we will start pt on supplemental oxygen and obtain ct chest.  1. Ill-defined density in the right mid lung measuring 4.7 x 2.1 cm which could be from pneumonia, atelectasis, or fluid along the minor fissure. Given the cough and airway thickening, pneumonia is favored. Followup PA and lateral chest X-ray is recommended in 3-4 weeks following trial of antibiotic therapy to ensure resolution and exclude underlying malignancy. 2. Mild enlargement of the cardiopericardial silhouette. 3. Leadless pacer noted. 4. Atherosclerotic calcification of the aortic arch. Chest ct shows PNA / pleural effusion,:  IMPRESSION: 1. Cardiomegaly with calcific CAD and leadless intraventricular pacemaker. 2. Prominent pulmonary trunk in keeping with pulmonary arterial hypertension. 3. Interval new small left and minimal right pleural effusions. 4. Increased consolidation posteriorly in the left upper lobe extending into the dorsal lingular base, and increased consolidation in the superior segments of both lower lobes. Findings most likely due to multilobar pneumonic consolidation with additional patchy hazy airspace disease in the posterior basal left lower lobe. Aspiration etiology not excluded. 5. Diffuse bronchial thickening with scattered subsegmental bronchial/bronchiolar plugging in both lower lobes, left greater than right, query mucous plugging or aspiration. 6. Patulous esophagus with scattered retained fluid. 7. Chronic nodular liver contour consistent with cirrhosis. 8. Increased small volume of upper abdominal ascites. 9. Multinodular thyroid, largest nodule 1.7 cm in the right lobe. No record of prior thyroid ultrasound. Recommend thyroid ultrasound if clinically warranted taking into account advanced age. 10. Aortic  and coronary artery atherosclerosis.    We will continue oxygen/ iv antibiotics.

## 2022-06-27 NOTE — ED Provider Notes (Signed)
[facility] Provider Note    Event Date/Time   First MD Initiated Contact with Patient 06/27/22 1813     (approximate)   History   Chief Complaint: Weakness   HPI  Katrina Chen is a 87 y.o. adult with a history of CAD, chronic atrial fibrillation, CKD who is brought to the ED due to generalized weakness for the past 2 days.  Patient also has not been eating or drinking for the last 3 days.  She reports a loss of appetite.  She also has a nonproductive cough.  No fevers chills or chest pain.  No abdominal pain nausea or vomiting.  Family member at bedside reports the patient had COVID a month ago.     Physical Exam   Triage Vital Signs: ED Triage Vitals  Enc Vitals Group     BP 06/27/22 1557 (!) 155/57     Pulse Rate 06/27/22 1557 60     Resp 06/27/22 1557 20     Temp 06/27/22 1557 98.3 F (36.8 C)     Temp src --      SpO2 06/27/22 1557 94 %     Weight 06/27/22 1558 215 lb (97.5 kg)     Height --      Head Circumference --      [facility] Flow --      Pain Score 06/27/22 1557 5     Pain Loc --      Pain Edu? --      Excl. in GC? --     Most recent vital signs: Vitals:   06/27/22 1557  BP: (!) 155/57  Pulse: 60  Resp: 20  Temp: 98.3 F (36.8 C)  SpO2: 94%    General: Awake, no distress.  CV:  Good peripheral perfusion.  Regular rate rhythm Resp:  Normal effort.  Frequent, weak cough.  No focal crackles.  Symmetric air movement. Abd:  No distention. Soft nontender Other:  Dry oral mucosa. No LE edma.    ED Results / Procedures / Treatments   Labs (all labs ordered are listed, but only abnormal results are displayed) Labs Reviewed  BASIC METABOLIC PANEL - Abnormal; Notable for the following components:      Result Value   Sodium 121 (*)    Potassium 5.9 (*)    Chloride 88 (*)    Glucose, Bld 117 (*)    BUN 28 (*)    Creatinine, Ser 1.23 (*)    GFR, Estimated 43 (*)    All other components within normal limits  CBC - Abnormal;  Notable for the following components:   RBC 3.68 (*)    Hemoglobin 9.5 (*)    HCT 31.4 (*)    MCH 25.8 (*)    All other components within normal limits  URINALYSIS, W/ REFLEX TO CULTURE (INFECTION SUSPECTED)  D-DIMER, QUANTITATIVE  CK  MAGNESIUM  TYPE AND SCREEN     EKG Interpreted by me Atrial fibrillation, rate of 60.  Right axis, left bundle branch block.  No acute ischemic changes.   RADIOLOGY Chest x-ray interpreted by me, there is some increased haziness of the right lung without focal consolidation, possibly atypical pneumonia.  Radiology report reviewed.   PROCEDURES:  Procedures   MEDICATIONS ORDERED IN ED: Medications  pantoprazole (PROTONIX) injection 40 mg (has no administration in time range)  sodium chloride 0.9 % bolus 1,000 mL (1,000 mLs Intravenous New Bag/Given 06/27/22 1839)     IMPRESSION / MDM / ASSESSMENT  AND PLAN / ED COURSE  I reviewed the triage vital signs and the nursing notes.  DDx: Dehydration, AKI, electrolyte abnormality, anemia, pneumonia, viral illness, UTI  Patient's presentation is most consistent with acute presentation with potential threat to life or bodily function.  Patient presents with generalized weakness, loss of appetite.  Clinically she appears dehydrated.  Serum labs show mild hypokalemia as well as hyponatremia with a sodium of 121.  Will check chest x-ray, urinalysis.  Will plan to admit.  Start IV fluids with saline.        FINAL CLINICAL IMPRESSION(S) / ED DIAGNOSES   Final diagnoses:  Dehydration  Hyponatremia     Rx / DC Orders   ED Discharge Orders     None        Note:  This document was prepared using Dragon voice recognition software and may include unintentional dictation errors.   Sharman Cheek, MD 06/28/22 0000

## 2022-06-27 NOTE — Assessment & Plan Note (Signed)
    Latest Ref Rng & Units 06/27/2022    4:01 PM 04/22/2022    3:07 PM 01/13/2022    6:20 PM  CBC  WBC 4.0 - 10.5 K/uL 6.8  6.3  6.5   Hemoglobin 12.0 - 15.0 g/dL 9.5  91.4  78.2   Hematocrit 36.0 - 46.0 % 31.4  35.5  39.3   Platelets 150 - 400 K/uL 342  226  281   Pt is getting iron. IV PPI. D/c meloxicam.  Cont eliquis.

## 2022-06-27 NOTE — Assessment & Plan Note (Signed)
Pt is not able to give history due to age and dementia.  We will obtain troponin.  Cont eliquis and coreg.

## 2022-06-27 NOTE — Assessment & Plan Note (Addendum)
EKG shows v paced rhythm.  Continue eliquis and coreg.

## 2022-06-28 ENCOUNTER — Encounter: Payer: Self-pay | Admitting: Internal Medicine

## 2022-06-28 DIAGNOSIS — R627 Adult failure to thrive: Secondary | ICD-10-CM | POA: Diagnosis present

## 2022-06-28 DIAGNOSIS — F0390 Unspecified dementia without behavioral disturbance: Secondary | ICD-10-CM | POA: Diagnosis not present

## 2022-06-28 DIAGNOSIS — I700 Atherosclerosis of aorta: Secondary | ICD-10-CM | POA: Diagnosis present

## 2022-06-28 DIAGNOSIS — D509 Iron deficiency anemia, unspecified: Secondary | ICD-10-CM | POA: Diagnosis present

## 2022-06-28 DIAGNOSIS — J9601 Acute respiratory failure with hypoxia: Secondary | ICD-10-CM | POA: Diagnosis present

## 2022-06-28 DIAGNOSIS — E871 Hypo-osmolality and hyponatremia: Secondary | ICD-10-CM | POA: Diagnosis present

## 2022-06-28 DIAGNOSIS — J189 Pneumonia, unspecified organism: Secondary | ICD-10-CM | POA: Diagnosis present

## 2022-06-28 DIAGNOSIS — R531 Weakness: Secondary | ICD-10-CM | POA: Diagnosis present

## 2022-06-28 DIAGNOSIS — I2721 Secondary pulmonary arterial hypertension: Secondary | ICD-10-CM | POA: Diagnosis present

## 2022-06-28 DIAGNOSIS — N179 Acute kidney failure, unspecified: Secondary | ICD-10-CM | POA: Diagnosis present

## 2022-06-28 DIAGNOSIS — E785 Hyperlipidemia, unspecified: Secondary | ICD-10-CM | POA: Diagnosis present

## 2022-06-28 DIAGNOSIS — R188 Other ascites: Secondary | ICD-10-CM | POA: Diagnosis present

## 2022-06-28 DIAGNOSIS — M25561 Pain in right knee: Secondary | ICD-10-CM | POA: Diagnosis present

## 2022-06-28 DIAGNOSIS — I10 Essential (primary) hypertension: Secondary | ICD-10-CM | POA: Diagnosis present

## 2022-06-28 DIAGNOSIS — E875 Hyperkalemia: Secondary | ICD-10-CM | POA: Diagnosis present

## 2022-06-28 DIAGNOSIS — E042 Nontoxic multinodular goiter: Secondary | ICD-10-CM | POA: Diagnosis present

## 2022-06-28 DIAGNOSIS — I482 Chronic atrial fibrillation, unspecified: Secondary | ICD-10-CM | POA: Diagnosis present

## 2022-06-28 DIAGNOSIS — Z66 Do not resuscitate: Secondary | ICD-10-CM | POA: Diagnosis present

## 2022-06-28 DIAGNOSIS — E860 Dehydration: Secondary | ICD-10-CM | POA: Diagnosis present

## 2022-06-28 DIAGNOSIS — K7460 Unspecified cirrhosis of liver: Secondary | ICD-10-CM | POA: Diagnosis present

## 2022-06-28 DIAGNOSIS — I447 Left bundle-branch block, unspecified: Secondary | ICD-10-CM | POA: Diagnosis present

## 2022-06-28 DIAGNOSIS — F0150 Vascular dementia without behavioral disturbance: Secondary | ICD-10-CM | POA: Diagnosis present

## 2022-06-28 DIAGNOSIS — Z8616 Personal history of COVID-19: Secondary | ICD-10-CM | POA: Diagnosis not present

## 2022-06-28 DIAGNOSIS — E669 Obesity, unspecified: Secondary | ICD-10-CM | POA: Diagnosis present

## 2022-06-28 DIAGNOSIS — Z515 Encounter for palliative care: Secondary | ICD-10-CM | POA: Diagnosis not present

## 2022-06-28 LAB — COMPREHENSIVE METABOLIC PANEL
ALT: 14 U/L (ref 0–44)
AST: 25 U/L (ref 15–41)
Albumin: 2.8 g/dL — ABNORMAL LOW (ref 3.5–5.0)
Alkaline Phosphatase: 93 U/L (ref 38–126)
Anion gap: 7 (ref 5–15)
BUN: 26 mg/dL — ABNORMAL HIGH (ref 8–23)
CO2: 25 mmol/L (ref 22–32)
Calcium: 9.5 mg/dL (ref 8.9–10.3)
Chloride: 95 mmol/L — ABNORMAL LOW (ref 98–111)
Creatinine, Ser: 1.08 mg/dL — ABNORMAL HIGH (ref 0.44–1.00)
GFR, Estimated: 50 mL/min — ABNORMAL LOW (ref >60)
Glucose, Bld: 80 mg/dL (ref 70–99)
Potassium: 5.3 mmol/L — ABNORMAL HIGH (ref 3.5–5.1)
Sodium: 127 mmol/L — ABNORMAL LOW (ref 135–145)
Total Bilirubin: 0.5 mg/dL (ref 0.3–1.2)
Total Protein: 7 g/dL (ref 6.5–8.1)

## 2022-06-28 LAB — CBC
HCT: 31.9 % — ABNORMAL LOW (ref 36.0–46.0)
Hemoglobin: 9.8 g/dL — ABNORMAL LOW (ref 12.0–15.0)
MCH: 26.1 pg (ref 26.0–34.0)
MCHC: 30.7 g/dL (ref 30.0–36.0)
MCV: 84.8 fL (ref 80.0–100.0)
Platelets: 325 10*3/uL (ref 150–400)
RBC: 3.76 MIL/uL — ABNORMAL LOW (ref 3.87–5.11)
RDW: 15 % (ref 11.5–15.5)
WBC: 6.5 10*3/uL (ref 4.0–10.5)
nRBC: 0 % (ref 0.0–0.2)

## 2022-06-28 LAB — TROPONIN I (HIGH SENSITIVITY): Troponin I (High Sensitivity): 51 ng/L — ABNORMAL HIGH (ref <18)

## 2022-06-28 LAB — SODIUM: Sodium: 129 mmol/L — ABNORMAL LOW (ref 135–145)

## 2022-06-28 MED ORDER — SODIUM CHLORIDE 0.9 % IV SOLN
2.0000 g | INTRAVENOUS | Status: DC
  Administered 2022-06-28: 2 g via INTRAVENOUS
  Filled 2022-06-28: qty 20
  Filled 2022-06-28: qty 2

## 2022-06-28 MED ORDER — SODIUM ZIRCONIUM CYCLOSILICATE 10 G PO PACK
10.0000 g | PACK | Freq: Once | ORAL | Status: AC
  Administered 2022-06-28: 10 g via ORAL
  Filled 2022-06-28: qty 1

## 2022-06-28 MED ORDER — SODIUM CHLORIDE 0.9 % IV SOLN
500.0000 mg | INTRAVENOUS | Status: DC
  Administered 2022-06-28: 500 mg via INTRAVENOUS
  Filled 2022-06-28: qty 5
  Filled 2022-06-28: qty 500

## 2022-06-28 MED ORDER — HYDRALAZINE HCL 20 MG/ML IJ SOLN
20.0000 mg | Freq: Three times a day (TID) | INTRAMUSCULAR | Status: DC | PRN
  Administered 2022-06-28: 20 mg via INTRAVENOUS
  Filled 2022-06-28: qty 1

## 2022-06-28 MED ORDER — IPRATROPIUM-ALBUTEROL 0.5-2.5 (3) MG/3ML IN SOLN: 3.0000 mL | Freq: Four times a day (QID) | RESPIRATORY_TRACT | Status: DC | PRN

## 2022-06-28 NOTE — Plan of Care (Signed)
  Problem: Activity: Goal: Risk for activity intolerance will decrease Outcome: Progressing   Problem: Nutrition: Goal: Adequate nutrition will be maintained Outcome: Progressing   Problem: Elimination: Goal: Will not experience complications related to bowel motility Outcome: Progressing Goal: Will not experience complications related to urinary retention Outcome: Progressing   Problem: Skin Integrity: Goal: Risk for impaired skin integrity will decrease Outcome: Progressing   Problem: Pain Managment: Goal: General experience of comfort will improve Outcome: Progressing

## 2022-06-28 NOTE — TOC Progression Note (Signed)
Transition of Care [facility]) - Progression Note    Patient Details  Name: Katrina Chen MRN: [id number] Date of Birth: 1935/07/12  Transition of Care [employer]) CM/SW Contact  Marlowe Sax, RN Phone Number: 06/28/2022, 2:00 PM  Clinical Narrative:    The patient is long term resident at [facility], When she discharges unless she is made inpatient they do not need a new FL2 but will need a DC summary, she will go to room [facility] at [facility]        Expected Discharge Plan and Services                                               Social Determinants of Health (SDOH) Interventions SDOH Screenings   Food Insecurity: Patient Unable To Answer (06/27/2022)  Housing: Low Risk  (06/27/2022)  Transportation Needs: Patient Unable To Answer (06/27/2022)  Utilities: Patient Unable To Answer (06/27/2022)  Tobacco Use: Low Risk  (06/28/2022)    Readmission Risk Interventions     No data to display

## 2022-06-28 NOTE — Progress Notes (Signed)
PROGRESS NOTE    Katrina Chen  XBM:[id number] DOB: [birth year] DOA: 06/27/2022 PCP: Katrina Honour, FNP   Assessment & Plan:   Principal Problem:   Generalized weakness Active Problems:   Acute respiratory failure with hypoxia   Atrial fibrillation, chronic   Essential hypertension   CAD (coronary artery disease)   Cirrhosis   Iron deficiency anemia   Anemia   Electrolyte abnormality   Multifocal pneumonia  Assessment and Plan: Failure to thrive: likely secondary to all below. Progressive decline over the past 1 year as per pt's daughter. Palliative care consulted  Generalized weakness: likely multifactorial- electrolyte abnormalities, anemia & age. PT/OT consulted  Dementia: continue w/ supportive care. Progressive decline over past 1 year  Acute hypoxic respiratory failure: 87% on RA. Continue on supplemental oxygen and wean as tolerated  Pneumonia: as per CT chest. Continue on IV ceftriaxine, azithromycin, bronchodilators. Encourage incentive spirometry   Hyperkalemia: lokelma x 1. Repeat potassium level ordered   Chronic a. fib: continue on coreg, eliquis   HTN: continue on coreg, hydralazine, clonidine. Holding losartan secondary to AKI  Hyponatremia: trending up today. Continue on IVFs. Continue to monitor Na levels  Cirrhosis: not on any meds as per med rec   Hx of CAD: continue on coreg. Troponins are minimally elevated  Obesity: BMI 35.2. Would benefit from weight loss   DVT prophylaxis: eliquis  Code Status: DNR Family Communication: discussed pt's care w/ pt's daughter, Katrina Chen & answered her questions  Disposition Plan: likely d/c back to [facility]   Level of care: Telemetry Medical  Status is: Inpatient Remains inpatient appropriate because: severity of illness      Consultants:  Palliative care  Procedures:   Antimicrobials: rocephin, azthromycin    Subjective: Pt is lethargic   Objective: Vitals:   06/28/22 0500 06/28/22 0513  06/28/22 0513 06/28/22 0850  BP:  (!) 181/83 (!) 181/83 (!) 166/63  Pulse:   67 87  Resp:    16  Temp:    98.2 F (36.8 C)  TempSrc:      SpO2:   100% 100%  Weight: 99 kg       Intake/Output Summary (Last 24 hours) at 06/28/2022 1510 Last data filed at 06/28/2022 1412 Gross per 24 hour  Intake 478.25 ml  Output 200 ml  Net 278.25 ml   Filed Weights   06/27/22 1558 06/28/22 0500  Weight: 97.5 kg 99 kg    Examination:  General exam: Appears lethargic  Respiratory system: diminished breath sounds b/l  Cardiovascular system: S1 & S2 +. No rubs, gallops or clicks. Gastrointestinal system: Abdomen is nondistended, soft and nontender. Normal bowel sounds heard. Central nervous system: lethargic  Psychiatry: Judgement and insight appears poor. Flat mood and affect      Data Reviewed: I have personally reviewed following labs and imaging studies  CBC: Recent Labs  Lab 06/27/22 1601 06/28/22 0457  WBC 6.8 6.5  HGB 9.5* 9.8*  HCT 31.4* 31.9*  MCV 85.3 84.8  PLT 342 325   Basic Metabolic Panel: Recent Labs  Lab 06/27/22 1601 06/27/22 1905 06/28/22 0457 06/28/22 1231  NA 121*  --  127* 129*  K 5.9*  --  5.3*  --   CL 88*  --  95*  --   CO2 25  --  25  --   GLUCOSE 117*  --  80  --   BUN 28*  --  26*  --   CREATININE 1.23*  --  1.08*  --  CALCIUM 9.4  --  9.5  --   MG  --  2.3  --   --    GFR: Estimated Creatinine Clearance (by C-G formula based on SCr of 1.08 mg/dL (H)) Female: 40.9 mL/min (A) Female: 55.1 mL/min (A) Liver Function Tests: Recent Labs  Lab 06/27/22 1902 06/28/22 0457  AST 27 25  ALT 13 14  ALKPHOS 93 93  BILITOT 0.5 0.5  PROT 7.0 7.0  ALBUMIN 2.8* 2.8*   No results for input(s): "LIPASE", "AMYLASE" in the last 168 hours. No results for input(s): "AMMONIA" in the last 168 hours. Coagulation Profile: Recent Labs  Lab 06/27/22 1902  INR 1.4*   Cardiac Enzymes: Recent Labs  Lab 06/27/22 1905  CKTOTAL 69   BNP (last 3  results) No results for input(s): "PROBNP" in the last 8760 hours. HbA1C: No results for input(s): "HGBA1C" in the last 72 hours. CBG: No results for input(s): "GLUCAP" in the last 168 hours. Lipid Profile: No results for input(s): "CHOL", "HDL", "LDLCALC", "TRIG", "CHOLHDL", "LDLDIRECT" in the last 72 hours. Thyroid Function Tests: No results for input(s): "TSH", "T4TOTAL", "FREET4", "T3FREE", "THYROIDAB" in the last 72 hours. Anemia Panel: No results for input(s): "VITAMINB12", "FOLATE", "FERRITIN", "TIBC", "IRON", "RETICCTPCT" in the last 72 hours. Sepsis Labs: Recent Labs  Lab 06/27/22 1902  PROCALCITON <0.10    No results found for this or any previous visit (from the past 240 hour(s)).       Radiology Studies: CT CHEST WO CONTRAST  Result Date: 06/27/2022 CLINICAL DATA:  Respiratory failure and increased weakness. EXAM: CT CHEST WITHOUT CONTRAST TECHNIQUE: Multidetector CT imaging of the chest was performed following the standard protocol without IV contrast. RADIATION DOSE REDUCTION: This exam was performed according to the departmental dose-optimization program which includes automated exposure control, adjustment of the mA and/or kV according to patient size and/or use of iterative reconstruction technique. COMPARISON:  Portable chest today, chest CT without contrast 04/13/2022, and chest CTs without contrast 11/11/2021 and 10/26/2021. 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 Leadless pacemaker again noted in the apex of the right ventricle. There is heavy aortic calcific plaque beginning in the arch with patchy calcification in the great vessels. No aortic aneurysm. The pulmonary trunk is 3.4 cm indicating arterial hypertension. This was seen previously. Pulmonary veins are normal caliber. There is no pericardial effusion. Mediastinum/Nodes: Patulous esophagus with scattered retained fluid. No focal  wall thickening. Small amount of fluid layering in the distal trachea, with extension of what are believed to be mucoid secretions into the proximal left main bronchus. The right main bronchus is patent. Heterogeneous multinodular thyroid, largest individual nodule 1.7 cm in the right lobe is again noted. No record of prior thyroid ultrasound. Recommend thyroid ultrasound if clinically warranted taking into account advanced age. There are clustered mildly prominent azygoesophageal recess lymph nodes up to 1 cm in short axis, a few similar sized subcarinal and right paratracheal lymph nodes, but no further intrathoracic adenopathy. There is no worsening of the pre-existing adenopathy. Please note hilar adenopathy is difficult to assess without contrast Lungs/Pleura: Interval new layering small left and minimal right pleural effusions. There is increased consolidation posteriorly in the left upper lobe extending into the dorsal lingular base, and increased consolidation in the superior segments of both lower lobes. Findings most likely due to multilobar pneumonic consolidation with additional patchy hazy airspace disease in the posterior basal left lower lobe. Linear atelectasis continues to be seen in  the right lower lobe. Background mild emphysematous disease with centrilobular changes predominating. No other focal infiltrate or pulmonary nodule is seen. Diffuse bronchial thickening has increased, more so in the lower lobes with scattered subsegmental bronchial/bronchiolar plugging in both lower lobes, query mucous plugging or aspiration. This is greater in the left lower lobe than the right. The right upper and middle lobes are clear. There is no pneumothorax. Upper Abdomen: Chronic nodular hepatic contour consistent with cirrhosis. No splenomegaly. Increased small volume of perihepatic and perisplenic upper abdominal ascites. No upper abdominal free air. Mild elevation right diaphragm. Musculoskeletal: Osteopenia,  thoracic kyphosis and diffuse degenerative disc disease thoracic spine. There is no acute compression fracture. No destructive bone lesions. The ribcage is intact. IMPRESSION: 1. Cardiomegaly with calcific CAD and leadless intraventricular pacemaker. 2. Prominent pulmonary trunk in keeping with pulmonary arterial hypertension. 3. Interval new small left and minimal right pleural effusions. 4. Increased consolidation posteriorly in the left upper lobe extending into the dorsal lingular base, and increased consolidation in the superior segments of both lower lobes. Findings most likely due to multilobar pneumonic consolidation with additional patchy hazy airspace disease in the posterior basal left lower lobe. Aspiration etiology not excluded. 5. Diffuse bronchial thickening with scattered subsegmental bronchial/bronchiolar plugging in both lower lobes, left greater than right, query mucous plugging or aspiration. 6. Patulous esophagus with scattered retained fluid. 7. Chronic nodular liver contour consistent with cirrhosis. 8. Increased small volume of upper abdominal ascites. 9. Multinodular thyroid, largest nodule 1.7 cm in the right lobe. No record of prior thyroid ultrasound. Recommend thyroid ultrasound if clinically warranted taking into account advanced age. 10. Aortic and coronary artery atherosclerosis. Electronically Signed   By: Almira Bar M.D.   On: 06/27/2022 20:27   DG Chest Portable 1 View  Result Date: 06/27/2022 CLINICAL DATA:  Cough and weakness EXAM: PORTABLE CHEST 1 VIEW COMPARISON:  01/13/2022 FINDINGS: Leadless pacer noted. Atherosclerotic calcification of the aortic arch. Mild enlargement of the cardiopericardial silhouette. Airway thickening, right greater than left, along with ill-defined density in the right mid lung measuring about 4.7 by 2.1 cm which could be from pneumonia, atelectasis, or fluid along the minor fissure. No blunting of the costophrenic angles. IMPRESSION: 1.  Ill-defined density in the right mid lung measuring 4.7 x 2.1 cm which could be from pneumonia, atelectasis, or fluid along the minor fissure. Given the cough and airway thickening, pneumonia is favored. Followup PA and lateral chest X-ray is recommended in 3-4 weeks following trial of antibiotic therapy to ensure resolution and exclude underlying malignancy. 2. Mild enlargement of the cardiopericardial silhouette. 3. Leadless pacer noted. 4. Atherosclerotic calcification of the aortic arch. Electronically Signed   By: Gaylyn Rong M.D.   On: 06/27/2022 19:14        Scheduled Meds:  apixaban  2.5 mg Oral BID   carvedilol  25 mg Oral BID WC   cycloSPORINE  1 drop Both Eyes BID   divalproex  250 mg Oral BID   [START ON 06/29/2022] fluticasone  1 spray Each Nare Daily   hydrALAZINE  50 mg Oral Q8H   iron polysaccharides  150 mg Oral Daily   OLANZapine  2.5 mg Oral QHS   olopatadine  1 drop Both Eyes BID   pantoprazole (PROTONIX) IV  40 mg Intravenous Q12H   sodium chloride flush  3 mL Intravenous Q12H   venlafaxine XR  75 mg Oral Daily   Continuous Infusions:  sodium chloride 51 mL/hr at 06/28/22 0554  azithromycin (ZITHROMAX) 500 mg in sodium chloride 0.9 % 250 mL IVPB     cefTRIAXone (ROCEPHIN)  IV       LOS: 0 days    Time spent: 35 mins     Charise Killian, MD [facility] Pager [phone]  If 7PM-7AM, please contact night-coverage [email or web address] 06/28/2022, 3:10 PM

## 2022-06-28 NOTE — Evaluation (Signed)
Occupational Therapy Evaluation Patient Details Name: Katrina Chen MRN: [id number] DOB: 01-19-1936 Today's Date: 06/28/2022   History of Present Illness Katrina Chen is an 87 y.o. adult brought to the emergency room for generalized weakness for the past few days.  Patient has not been eating and drinking.  Patient also has a cough.  While examining patient reports abdominal pain while pressing on her abdomen. She is from [facility]   Clinical Impression   Pt is from [facility] and per chart review, pt was seen in hospital (July 2023) and family reported she had been wheelchair bound for a month at that time. Pt is very stiff with movement and cries out with +2 assistance to get to EOB. Pt is likely at baseline for self care and functional mobility. She is lethargic throughout with limited vocalization during session. OT to complete orders at this time.      Recommendations for follow up therapy are one component of a multi-disciplinary discharge planning process, led by the attending physician.  Recommendations may be updated based on patient status, additional functional criteria and insurance authorization.   Assistance Recommended at Discharge Intermittent Supervision/Assistance  Patient can return home with the following Two people to help with bathing/dressing/bathroom;Two people to help with walking and/or transfers;Assistance with cooking/housework;Assist for transportation;Help with stairs or ramp for entrance    Functional Status Assessment  Patient has not had a recent decline in their functional status  Equipment Recommendations  None recommended by OT       Precautions / Restrictions Precautions Precautions: Fall Restrictions Weight Bearing Restrictions: No      Mobility Bed Mobility Overal bed mobility: Needs Assistance Bed Mobility: Rolling, Supine to Sit, Sit to Supine Rolling: Max assist, +2 for physical assistance   Supine to sit: Max assist, +2 for physical  assistance Sit to supine: Max assist, +2 for physical assistance   General bed mobility comments: patient demonstrates pain with movement and has generalized stiffness throughout joints. Indicates patient is not getting up on regular basis.    Transfers                   General transfer comment: not attempted      Balance Overall balance assessment: Needs assistance Sitting-balance support: Feet supported Sitting balance-Leahy Scale: Poor Sitting balance - Comments: forward flexed posture in sitting, requires min A for safety                                   ADL either performed or assessed with clinical judgement   ADL Overall ADL's : Needs assistance/impaired                                       General ADL Comments: total A for all self care     Vision Patient Visual Report: No change from baseline              Pertinent Vitals/Pain Pain Assessment Pain Assessment: Faces Faces Pain Scale: Hurts whole lot Pain Location: general with movement, reports right knee pain Pain Descriptors / Indicators: Grimacing, Guarding, Moaning Pain Intervention(s): Monitored during session, Repositioned     Hand Dominance     Extremity/Trunk Assessment Upper Extremity Assessment Upper Extremity Assessment: Generalized weakness   Lower Extremity Assessment Lower Extremity Assessment: Generalized weakness   Cervical / Trunk  Assessment Cervical / Trunk Assessment: Normal   Communication Communication Communication: No difficulties   Cognition Arousal/Alertness: Lethargic Behavior During Therapy: Flat affect Overall Cognitive Status: No family/caregiver present to determine baseline cognitive functioning                                 General Comments: answers simple questions but very limited vocalizations during session. Pt is oriented to self and " county"                Home Living Family/patient  expects to be discharged to:: Skilled nursing facility                                 Additional Comments: [facility] long term care      Prior Functioning/Environment Prior Level of Function : Needs assist             Mobility Comments: Per MD "spoke with [facility] staff and patient's daughter on the phone. Patient has baseline dementia. Last couple weeks she has been wheelchair-bound due to weakness. She has had falls at the facility. Per daughter she would take 1 to 2 steps at [facility]. Before that used to take 4 to 5 steps. Wheelchair-bound most of the time per staff." ( From admission back in July 2023) ADLs Comments: Assist from staff with ADLs                 OT Goals(Current goals can be found in the care plan section) Acute Rehab OT Goals Patient Stated Goal: none reported Time For Goal Achievement: 06/28/22 Potential to Achieve Goals: Fair  OT Frequency:      Co-evaluation PT/OT/SLP Co-Evaluation/Treatment: Yes Reason for Co-Treatment: For patient/therapist safety;To address functional/ADL transfers PT goals addressed during session: Mobility/safety with mobility OT goals addressed during session: ADL's and self-care      AM-PAC OT "6 Clicks" Daily Activity     Outcome Measure Help from another person eating meals?: A Lot Help from another person taking care of personal grooming?: A Lot Help from another person toileting, which includes using toliet, bedpan, or urinal?: Total Help from another person bathing (including washing, rinsing, drying)?: Total Help from another person to put on and taking off regular upper body clothing?: A Lot Help from another person to put on and taking off regular lower body clothing?: Total 6 Click Score: 9   End of Session Nurse Communication: Mobility status  Activity Tolerance: Patient limited by fatigue Patient left: in bed;with call bell/phone within reach;with bed alarm set                   Time:  1610-9604 OT Time Calculation (min): 14 min Charges:  OT General Charges $OT Visit: 1 Visit OT Evaluation $OT Eval Moderate Complexity: 1 [address] [place], OTR/L , CBIS ascom [phone]  06/28/22, 12:37 PM

## 2022-06-28 NOTE — Evaluation (Signed)
Clinical/Bedside Swallow Evaluation Patient Details  Name: Katrina Chen MRN: [id number] Date of Birth: 02/24/1936  Today's Date: 06/28/2022 Time: SLP Start Time (ACUTE ONLY): 1050 SLP Stop Time (ACUTE ONLY): 1130 SLP Time Calculation (min) (ACUTE ONLY): 40 min  Past Medical History:  Past Medical History:  Diagnosis Date   A-fib    Anxiety    Chronic kidney disease    Depression    Hyperlipidemia    Hypertension    Iron deficiency    Vascular dementia    Past Surgical History:  Past Surgical History:  Procedure Laterality Date   ESOPHAGOGASTRODUODENOSCOPY (EGD) WITH PROPOFOL N/A 07/01/2021   Procedure: ESOPHAGOGASTRODUODENOSCOPY (EGD) WITH PROPOFOL;  Surgeon: Wyline Mood, MD;  Location: [facility] ENDOSCOPY;  Service: Gastroenterology;  Laterality: N/A;   ESOPHAGOGASTRODUODENOSCOPY (EGD) WITH PROPOFOL N/A 07/27/2021   Procedure: ESOPHAGOGASTRODUODENOSCOPY (EGD) WITH PROPOFOL;  Surgeon: Toney Reil, MD;  Location: [facility] ENDOSCOPY;  Service: Gastroenterology;  Laterality: N/A;   PACEMAKER IMPLANT     HPI:  Per H&P, pt "is an 87 y.o. adult brought to the emergency room for generalized weakness for the past few days.  Patient has not been eating and drinking.  Patient also has a cough.  While examining patient reports abdominal pain while pressing on her abdomen not otherwise.  HPI is difficult to obtain from the patient."  Per home health palliative care note on 4/8, pt "87 y.o. year old adult  with multiple medical problems including Vascular Dementia with behaviors, CAD, Afib, HTN, Asthma, h/o gastric polyps, cirrhosis, CKD, OA multiple sites, spinal stenosis, obesity, iron deficiency anemia, intraocular lense, h/o posterior vitreous detachment of left eye, protein calorie malnutrition, h/o bilateral total knee replacement, bipolar disorder. [name] resides at [facility]. [name] requires assistance for mobility, transfers, adl's including bathing, dressing. [name] is hoyer to  chair. [name] requires assistance with feeding."    Assessment / Plan / Recommendation  Clinical Impression   Pt seen today for BSE. Pt laying in bed w/ NSG present upon ST arrival. Pt aroused easily to verbal stim but kept her eyes closed the majority of the session. Pt minimally verbal t/o session but followed simple commands intermittently and occasionally answered questions (often inappropriately). Confusion evident. Pt left sitting up in bed w/ PT and NSG present.  Pt receiving O2 via [place]; afebrile; WBC WNL. Of Note: Pt presents w/ diagnosed VASCULAR DEMENTIA at baseline per chart notes. Of Note: Pt w/ intermittent congested cough PRIOR TO administration of po's. Of Note: Pt presents w/ Edentulous status.  Pt appears to present w/ s/s concerning for potential oropharyngeal phase dysphagia in the setting of Dementia and subsequent decreased awareness of environment/self. ANY pt w/ cognitive decline is at increased risk of aspiration -- following Strict aspiration Precautions at all po's and using a modified diet can help reduce this risk.  Pt observed w/ trials of nectar thick liquids via straw and purees only d/t cognitive presentation today. Full assist for feeding. T/o trials, pt ate/drank reactively to tactile/verbal cues rather than proactively (in setting of Dementia). ST used straw pinching intermittently w/ trials of liquids to control bolus size. Noted pt w/ congested cough x1-2 following bite of puree and when belching. However, cough appeared consistent w/ congested cough noted Prior to administration of po's -- did not increase in frequency w/ po's. Cough did not appear consistent w/ nor correlated to po's. No other s/s of aspiration noted such as throat clearing, wet vocal quality, nor change in respiratory status.  Pt's oral phase appeared [facility] c/b adequate bolus control/management, timely A-P bolus transit, and clear oral cavity post-po's. No further trials were attempted d/t pt's  cognitive status. Of note: Pt w/ frequent belching b/t trials of po's -- potential impact of GI/esophageal dysmotility?  Recommend initiation of Dys 1 diet (purees) w/ nectar thick liquids via straw. Follow strict aspiration precautions at all po's sitting fully upright, small sips/bites -- pinching straw if necessary, eating/drinking slowly, rest breaks). Follow Reflux precautions at all po's. Full assist and supervision w/ meals. ST services will f/u as pt's medical status and cognitive presentation continue to improve. NSG/MD updated/agreed.   SLP Visit Diagnosis: Dysphagia, oral phase (R13.11)    Aspiration Risk  Moderate aspiration risk    Diet Recommendation   Recommend initiation of Dys 1 diet (purees) w/ nectar thick liquids via straw. Full assist w/ feeding and full supervision w/ meals.  Medication Administration: Crushed with puree    Other  Recommendations Oral Care Recommendations: Oral care BID;Oral care before and after PO    Recommendations for follow up therapy are one component of a multi-disciplinary discharge planning process, led by the attending physician.  Recommendations may be updated based on patient status, additional functional criteria and insurance authorization.  Follow up Recommendations Follow physician's recommendations for discharge plan and follow up therapies      Assistance Recommended at Discharge  Full  Functional Status Assessment Patient has had a recent decline in their functional status and/or demonstrates limited ability to make significant improvements in function in a reasonable and predictable amount of time  Frequency and Duration min 2x/week  2 weeks       Prognosis Prognosis for improved oropharyngeal function: Fair Barriers to Reach Goals: Cognitive deficits;Severity of deficits      Swallow Study   General Date of Onset: 06/27/22 HPI: Per H&P, pt "is an 87 y.o. adult brought to the emergency room for generalized weakness for  the past few days.  Patient has not been eating and drinking.  Patient also has a cough.  While examining patient reports abdominal pain while pressing on her abdomen not otherwise.  HPI is difficult to obtain from the patient." Per home health palliative care note on 4/8, pt "87 y.o. year old adult  with multiple medical problems including Vascular Dementia with behaviors, CAD, Afib, HTN, Asthma, h/o gastric polyps, cirrhosis, CKD, OA multiple sites, spinal stenosis, obesity, iron deficiency anemia, intraocular lense, h/o posterior vitreous detachment of left eye, protein calorie malnutrition, h/o bilateral total knee replacement, bipolar disorder. Ms. Morro resides at [facility]. Ms Stambaugh requires assistance for mobility, transfers, adl's including bathing, dressing. Ms Peckinpaugh is hoyer to chair. Ms Hubner requires assistance with feeding." Type of Study: Bedside Swallow Evaluation Previous Swallow Assessment: 11/2020 - recommend mech soft-reg w/ thin Diet Prior to this Study: NPO Temperature Spikes Noted: No (WBC 6.5) Respiratory Status: Nasal cannula History of Recent Intubation: No Behavior/Cognition: Alert;Confused;Cooperative;Lethargic/Drowsy;Distractible;Requires cueing Oral Cavity Assessment: Within Functional Limits Oral Care Completed by SLP: Yes Oral Cavity - Dentition: Edentulous Self-Feeding Abilities: Total assist Patient Positioning: Upright in bed Baseline Vocal Quality: Normal Volitional Swallow: Able to elicit    Oral/Motor/Sensory Function Overall Oral Motor/Sensory Function: Within functional limits   Ice Chips Ice chips: Not tested   Thin Liquid Thin Liquid: Not tested    Nectar Thick Nectar Thick Liquid: Within functional limits Presentation: Straw;Spoon (4 oz)   Honey Thick Honey Thick Liquid: Not tested   Puree Puree: Within functional limits Presentation:  Spoon (10+ trials)   Solid     Solid: Not tested     Shandiin Eisenbeis Graduate Clinician Surgery [facility],  Speech Pathology   Dennie Fetters 06/28/2022,1:58 PM

## 2022-06-28 NOTE — Evaluation (Signed)
Physical Therapy Evaluation Patient Details Name: Katrina Chen MRN: [id number] DOB: Aug 13, 1935 Today's Date: 06/28/2022  History of Present Illness  Katrina Chen is an 87 y.o. adult brought to the emergency room for generalized weakness for the past few days.  Patient has not been eating and drinking.  Patient also has a cough.  While examining patient reports abdominal pain while pressing on her abdomen. She is from [facility]   Clinical Impression  Patient presents in bed, limited verbalizations but will answer simple questions. She is oriented to person and place. Patient requires total assistance with bed mobility at this time. Demonstrates general stiffness in all joints with mobility. Pain with mobility. Appears like she has not been getting out of bed while at [facility]. Appears to be at baseline level of mobility. No decline in functional status at this time. No skilled needs identified. Will sign off at this time.         Recommendations for follow up therapy are one component of a multi-disciplinary discharge planning process, led by the attending physician.  Recommendations may be updated based on patient status, additional functional criteria and insurance authorization.  Follow Up Recommendations Can patient physically be transported by private vehicle: No     Assistance Recommended at Discharge Frequent or constant Supervision/Assistance  Patient can return home with the following  Two people to help with walking and/or transfers;Two people to help with bathing/dressing/bathroom    Equipment Recommendations    Recommendations for Other Services       Functional Status Assessment Patient has not had a recent decline in their functional status     Precautions / Restrictions Precautions Precautions: Fall Restrictions Weight Bearing Restrictions: No      Mobility  Bed Mobility Overal bed mobility: Needs Assistance Bed Mobility: Rolling, Supine to Sit, Sit to  Supine Rolling: Max assist, +2 for physical assistance   Supine to sit: Max assist, +2 for physical assistance Sit to supine: Max assist, +2 for physical assistance   General bed mobility comments: patient demonstrates pain with movement and has generalized stiffness throughout joints. Indicates patient is not getting up on regular basis.    Transfers                   General transfer comment: not attempted    Ambulation/Gait               General Gait Details: was non-ambulatory last admission ( 8 mo ago)  Information systems manager [place] (Stroke Patients Only)       Balance Overall balance assessment: Needs assistance Sitting-balance support: Feet supported Sitting balance-Leahy Scale: Poor Sitting balance - Comments: forward flexed posture in sitting, requires min A for safety                                     Pertinent Vitals/Pain Pain Assessment Pain Assessment: Faces Faces Pain Scale: Hurts even more Pain Location: general with movement, reports right knee pain Pain Descriptors / Indicators: Grimacing, Guarding, Moaning Pain Intervention(s): Monitored during session, Repositioned    Home Living Family/patient expects to be discharged to:: Skilled nursing facility                   Additional Comments: [facility] long term care    Prior Function Prior Level of Function :  Needs assist             Mobility Comments: Per MD "spoke with [facility] staff and patient's daughter on the phone. Patient has baseline dementia. Last couple weeks she has been wheelchair-bound due to weakness. She has had falls at the facility. Per daughter she would take 1 to 2 steps at [facility]. Before that used to take 4 to 5 steps. Wheelchair-bound most of the time per staff." ( From admission back in July 2023) ADLs Comments: Assist from staff with ADLs     Hand Dominance        Extremity/Trunk  Assessment   Upper Extremity Assessment Upper Extremity Assessment: Defer to OT evaluation    Lower Extremity Assessment Lower Extremity Assessment: Generalized weakness    Cervical / Trunk Assessment Cervical / Trunk Assessment: Normal  Communication   Communication: No difficulties  Cognition Arousal/Alertness: Lethargic Behavior During Therapy: Flat affect Overall Cognitive Status: No family/caregiver present to determine baseline cognitive functioning                                 General Comments: answers simple questions        General Comments      Exercises     Assessment/Plan    PT Assessment Patient does not need any further PT services  PT Problem List         PT Treatment Interventions      PT Goals (Current goals can be found in the Care Plan section)  Acute Rehab PT Goals Patient Stated Goal: none stated PT Goal Formulation: Patient unable to participate in goal setting Time For Goal Achievement: 06/28/22    Frequency       Co-evaluation PT/OT/SLP Co-Evaluation/Treatment: Yes Reason for Co-Treatment: For patient/therapist safety;To address functional/ADL transfers           AM-PAC PT "6 Clicks" Mobility  Outcome Measure Help needed turning from your back to your side while in a flat bed without using bedrails?: Total Help needed moving from lying on your back to sitting on the side of a flat bed without using bedrails?: Total Help needed moving to and from a bed to a chair (including a wheelchair)?: Total Help needed standing up from a chair using your arms (e.g., wheelchair or bedside chair)?: Total Help needed to walk in hospital room?: Total Help needed climbing 3-5 steps with a railing? : Total 6 Click Score: 6    End of Session Equipment Utilized During Treatment: Oxygen Activity Tolerance: Patient limited by fatigue;Patient limited by pain Patient left: in bed;with call bell/phone within reach;with bed alarm  set Nurse Communication: Mobility status      Time: 2130-8657 PT Time Calculation (min) (ACUTE ONLY): 21 min   Charges:   PT Evaluation $PT Eval Moderate Complexity: 1 Mod          Shanielle Correll, PT, GCS 06/28/22,11:51 AM

## 2022-06-29 DIAGNOSIS — R531 Weakness: Secondary | ICD-10-CM | POA: Diagnosis not present

## 2022-06-29 DIAGNOSIS — E860 Dehydration: Secondary | ICD-10-CM

## 2022-06-29 DIAGNOSIS — R627 Adult failure to thrive: Secondary | ICD-10-CM | POA: Diagnosis not present

## 2022-06-29 DIAGNOSIS — Z66 Do not resuscitate: Secondary | ICD-10-CM

## 2022-06-29 DIAGNOSIS — E871 Hypo-osmolality and hyponatremia: Secondary | ICD-10-CM | POA: Diagnosis not present

## 2022-06-29 DIAGNOSIS — Z515 Encounter for palliative care: Secondary | ICD-10-CM

## 2022-06-29 DIAGNOSIS — F0390 Unspecified dementia without behavioral disturbance: Secondary | ICD-10-CM | POA: Diagnosis not present

## 2022-06-29 LAB — URINALYSIS, W/ REFLEX TO CULTURE (INFECTION SUSPECTED)
Bilirubin Urine: NEGATIVE
Glucose, UA: NEGATIVE mg/dL
Hgb urine dipstick: NEGATIVE
Ketones, ur: NEGATIVE mg/dL
Nitrite: NEGATIVE
Protein, ur: NEGATIVE mg/dL
Specific Gravity, Urine: 1.013 (ref 1.005–1.030)
WBC, UA: >50 WBC/hpf (ref 0–5)
pH: 5 (ref 5.0–8.0)

## 2022-06-29 LAB — BASIC METABOLIC PANEL
Anion gap: 8 (ref 5–15)
BUN: 25 mg/dL — ABNORMAL HIGH (ref 8–23)
CO2: 25 mmol/L (ref 22–32)
Calcium: 9.1 mg/dL (ref 8.9–10.3)
Chloride: 99 mmol/L (ref 98–111)
Creatinine, Ser: 1.06 mg/dL — ABNORMAL HIGH (ref 0.44–1.00)
GFR, Estimated: 51 mL/min — ABNORMAL LOW (ref >60)
Glucose, Bld: 85 mg/dL (ref 70–99)
Potassium: 4.7 mmol/L (ref 3.5–5.1)
Sodium: 132 mmol/L — ABNORMAL LOW (ref 135–145)

## 2022-06-29 LAB — CBC
HCT: 28.1 % — ABNORMAL LOW (ref 36.0–46.0)
Hemoglobin: 8.5 g/dL — ABNORMAL LOW (ref 12.0–15.0)
MCH: 26 pg (ref 26.0–34.0)
MCHC: 30.2 g/dL (ref 30.0–36.0)
MCV: 85.9 fL (ref 80.0–100.0)
Platelets: 297 10*3/uL (ref 150–400)
RBC: 3.27 MIL/uL — ABNORMAL LOW (ref 3.87–5.11)
RDW: 15.3 % (ref 11.5–15.5)
WBC: 7.2 10*3/uL (ref 4.0–10.5)
nRBC: 0 % (ref 0.0–0.2)

## 2022-06-29 MED ORDER — AZITHROMYCIN 250 MG PO TABS: 250.0000 mg | ORAL_TABLET | Freq: Every day | ORAL | 0 refills | Status: AC

## 2022-06-29 MED ORDER — GUAIFENESIN-DM 100-10 MG/5ML PO SYRP
5.0000 mL | ORAL_SOLUTION | ORAL | Status: DC | PRN
  Administered 2022-06-29: 5 mL via ORAL
  Filled 2022-06-29 (×2): qty 10

## 2022-06-29 MED ORDER — TRAMADOL HCL 50 MG PO TABS: 50.0000 mg | ORAL_TABLET | Freq: Two times a day (BID) | ORAL | 0 refills | Status: AC | PRN

## 2022-06-29 NOTE — TOC Progression Note (Signed)
Transition of Care [facility]) - Progression Note    Patient Details  Name: Quida Glasser MRN: [id number] Date of Birth: 1935-12-19  [facility] CM/SW Contact  Marlowe Sax, RN Phone Number: 06/29/2022, 1:58 PM  Clinical Narrative:    Spoke with the daughter about Hospice, provided the choice between agencies, she chose [facility] I called and spoke to [place] at [facility], they will see the [facility] at [facility]   Expected Discharge Plan: Long Term Nursing Home Barriers to Discharge: No Barriers Identified  Expected Discharge Plan and Services         Expected Discharge Date: 06/29/22                                     Social Determinants of Health (SDOH) Interventions SDOH Screenings   Food Insecurity: Patient Unable To Answer (06/27/2022)  Housing: Low Risk  (06/27/2022)  Transportation Needs: Patient Unable To Answer (06/27/2022)  Utilities: Patient Unable To Answer (06/27/2022)  Tobacco Use: Low Risk  (06/28/2022)    Readmission Risk Interventions     No data to display

## 2022-06-29 NOTE — Discharge Summary (Signed)
Physician Discharge Summary  Katrina Chen ZOX:[id number] DOB: Feb 20, 1936 DOA: 06/27/2022  PCP: Wardell Honour, FNP  Admit date: 06/27/2022 Discharge date: 06/29/2022  Admitted From:  [facility]  Disposition:  [facility] w/ hospice care  Recommendations for Outpatient Follow-up:  Follow up with hospice provider as soon as possible   Home Health: no  Equipment/Devices:  Discharge Condition: stable  CODE STATUS: DNR  Diet recommendation: As tolerated on hospice (but was on Dysphagia I w/ nectar thick liquids during hospital stay)  Brief/Interim Summary: HPI was taken from Dr. Irena Cords: Katrina Chen is an 87 y.o. adult brought to the emergency room for generalized weakness for the past few days.  Patient has not been eating and drinking.  Patient also has a cough.  While examining patient reports abdominal pain while pressing on her abdomen not otherwise.  HPI is difficult to obtain from the patient.   As per Dr. Mayford Knife 4/17-4/18/24: Pt was being treated w/ pneumonia w/ IV rocephin, azithromycin, bronchodilators. Pt was d/c back to [facility] w/ po azithromycin to complete the course of abxs if pt's daughter wants the pt to complete the course. Palliative care discussed pt's care w/ pt's daughter, [name] and decided to proceed w/ hospice care once pt was back at [facility].  For more information, please see previous progress/consult notes.   Discharge Diagnoses:  Principal Problem:   Generalized weakness Active Problems:   Acute respiratory failure with hypoxia   Atrial fibrillation, chronic   Essential hypertension   CAD (coronary artery disease)   Cirrhosis   Iron deficiency anemia   Anemia   Electrolyte abnormality   Multifocal pneumonia  Failure to thrive: likely secondary to all below. Progressive decline over the past 1 year as per pt's daughter. Palliative care consulted  Generalized weakness: likely multifactorial- electrolyte abnormalities, anemia & age. PT/OT consulted  Dementia: continue  w/ supportive care. Progressive decline over past 1 year  Acute hypoxic respiratory failure: 87% on RA. Weaned off of supplemental oxygen. Resolved  Pneumonia: as per CT chest. Continue on IV ceftriaxine, azithromycin, bronchodilators. Encourage incentive spirometry   Hyperkalemia: resolved   Chronic a. fib: continue on coreg, eliquis   HTN: continue on coreg, hydralazine, clonidine, losartain   Hyponatremia: continues to trend up again today   Cirrhosis: not on any meds as per med rec   Hx of CAD: continue on coreg. Troponins are minimally elevated  Obesity: BMI 35.2. Would benefit from weight loss   Discharge Instructions  Discharge Instructions     Diet general   Complete by: As directed    As tolerated, on dysphagia I diet w/ nectar thick liquids while the hospital   Discharge instructions   Complete by: As directed    F/u w/ hospice provider as soon as possible   Increase activity slowly   Complete by: As directed       Allergies as of 06/29/2022       Reactions   Iodine Swelling   Amlodipine Other (See Comments)   Nifedipine Swelling, Other (See Comments)   Iodinated Contrast Media    Lisinopril Swelling   Quinapril Swelling   Shellfish Allergy    Simvastatin Swelling        Medication List     STOP taking these medications    meloxicam 7.5 MG tablet Commonly known as: MOBIC       TAKE these medications    Alumina-Magnesia-Simethicone 200-200-20 MG/5ML suspension Generic drug: alum & mag hydroxide-simeth Take 15 mLs by mouth every  6 (six) hours as needed for indigestion or heartburn.   apixaban 2.5 MG Tabs tablet Commonly known as: ELIQUIS Take 2.5 mg by mouth 2 (two) times daily.   azithromycin 250 MG tablet Commonly known as: Zithromax Take 1 tablet (250 mg total) by mouth daily for 4 days.   bisacodyl 10 MG suppository Commonly known as: DULCOLAX Place 1 suppository (10 mg total) rectally daily as needed for mild constipation.    carvedilol 25 MG tablet Commonly known as: COREG Take 25 mg by mouth 2 (two) times daily with a meal.   cloNIDine 0.3 mg/24hr patch Commonly known as: CATAPRES - Dosed in mg/24 hr Place 0.3 mg onto the skin every Friday.   cycloSPORINE 0.05 % ophthalmic emulsion Commonly known as: RESTASIS Place 1 drop into both eyes 2 (two) times daily.   divalproex 125 MG capsule Commonly known as: DEPAKOTE SPRINKLE Take 125 mg by mouth 2 (two) times daily.   fluticasone 50 MCG/ACT nasal spray Commonly known as: FLONASE Place 1 spray into both nostrils daily.   hydrALAZINE 50 MG tablet Commonly known as: APRESOLINE Take 1 tablet (50 mg total) by mouth every 8 (eight) hours.   iron polysaccharides 150 MG capsule Commonly known as: NIFEREX Take 150 mg by mouth daily.   levocetirizine 5 MG tablet Commonly known as: XYZAL Take 5 mg by mouth daily.   losartan 100 MG tablet Commonly known as: COZAAR Take 100 mg by mouth daily.   melatonin 5 MG Tabs Take 10 mg by mouth at bedtime.   OLANZapine 2.5 MG tablet Commonly known as: ZYPREXA Take 2.5 mg by mouth at bedtime.   olopatadine 0.1 % ophthalmic solution Commonly known as: PATANOL Place 1 drop into both eyes 2 (two) times daily.   omeprazole 20 MG capsule Commonly known as: PRILOSEC Take 20 mg by mouth daily.   ondansetron 4 MG disintegrating tablet Commonly known as: ZOFRAN-ODT Take 4 mg by mouth every 8 (eight) hours as needed for vomiting or nausea.   polyethylene glycol 17 g packet Commonly known as: MIRALAX / GLYCOLAX Take 17 g by mouth 2 (two) times daily. What changed: when to take this   prazosin 1 MG capsule Commonly known as: MINIPRESS Take 1 mg by mouth at bedtime.   senna-docusate 8.6-50 MG tablet Commonly known as: Senokot-S Take 1 tablet by mouth 2 (two) times daily. What changed: how much to take   simethicone 80 MG chewable tablet Commonly known as: MYLICON Chew 80 mg by mouth 3 (three) times daily  as needed for flatulence.   traMADol 50 MG tablet Commonly known as: ULTRAM Take 1 tablet (50 mg total) by mouth every 12 (twelve) hours as needed for up to 2 days for moderate pain.   venlafaxine XR 75 MG 24 hr capsule Commonly known as: EFFEXOR-XR Take 75 mg by mouth daily.        Allergies  Allergen Reactions   Iodine Swelling   Amlodipine Other (See Comments)   Nifedipine Swelling and Other (See Comments)   Iodinated Contrast Media    Lisinopril Swelling   Quinapril Swelling   Shellfish Allergy    Simvastatin Swelling    Consultations: Palliative care    Procedures/Studies: CT CHEST WO CONTRAST  Result Date: 06/27/2022 CLINICAL DATA:  Respiratory failure and increased weakness. EXAM: CT CHEST WITHOUT CONTRAST TECHNIQUE: Multidetector CT imaging of the chest was performed following the standard protocol without IV contrast. RADIATION DOSE REDUCTION: This exam was performed according to the departmental dose-optimization  program which includes automated exposure control, adjustment of the mA and/or kV according to patient size and/or use of iterative reconstruction technique. COMPARISON:  Portable chest today, chest CT without contrast 04/13/2022, and chest CTs without contrast 11/11/2021 and 10/26/2021. 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 Leadless pacemaker again noted in the apex of the right ventricle. There is heavy aortic calcific plaque beginning in the arch with patchy calcification in the great vessels. No aortic aneurysm. The pulmonary trunk is 3.4 cm indicating arterial hypertension. This was seen previously. Pulmonary veins are normal caliber. There is no pericardial effusion. Mediastinum/Nodes: Patulous esophagus with scattered retained fluid. No focal wall thickening. Small amount of fluid layering in the distal trachea, with extension of what are believed to be mucoid secretions into  the proximal left main bronchus. The right main bronchus is patent. Heterogeneous multinodular thyroid, largest individual nodule 1.7 cm in the right lobe is again noted. No record of prior thyroid ultrasound. Recommend thyroid ultrasound if clinically warranted taking into account advanced age. There are clustered mildly prominent azygoesophageal recess lymph nodes up to 1 cm in short axis, a few similar sized subcarinal and right paratracheal lymph nodes, but no further intrathoracic adenopathy. There is no worsening of the pre-existing adenopathy. Please note hilar adenopathy is difficult to assess without contrast Lungs/Pleura: Interval new layering small left and minimal right pleural effusions. There is increased consolidation posteriorly in the left upper lobe extending into the dorsal lingular base, and increased consolidation in the superior segments of both lower lobes. Findings most likely due to multilobar pneumonic consolidation with additional patchy hazy airspace disease in the posterior basal left lower lobe. Linear atelectasis continues to be seen in the right lower lobe. Background mild emphysematous disease with centrilobular changes predominating. No other focal infiltrate or pulmonary nodule is seen. Diffuse bronchial thickening has increased, more so in the lower lobes with scattered subsegmental bronchial/bronchiolar plugging in both lower lobes, query mucous plugging or aspiration. This is greater in the left lower lobe than the right. The right upper and middle lobes are clear. There is no pneumothorax. Upper Abdomen: Chronic nodular hepatic contour consistent with cirrhosis. No splenomegaly. Increased small volume of perihepatic and perisplenic upper abdominal ascites. No upper abdominal free air. Mild elevation right diaphragm. Musculoskeletal: Osteopenia, thoracic kyphosis and diffuse degenerative disc disease thoracic spine. There is no acute compression fracture. No destructive bone  lesions. The ribcage is intact. IMPRESSION: 1. Cardiomegaly with calcific CAD and leadless intraventricular pacemaker. 2. Prominent pulmonary trunk in keeping with pulmonary arterial hypertension. 3. Interval new small left and minimal right pleural effusions. 4. Increased consolidation posteriorly in the left upper lobe extending into the dorsal lingular base, and increased consolidation in the superior segments of both lower lobes. Findings most likely due to multilobar pneumonic consolidation with additional patchy hazy airspace disease in the posterior basal left lower lobe. Aspiration etiology not excluded. 5. Diffuse bronchial thickening with scattered subsegmental bronchial/bronchiolar plugging in both lower lobes, left greater than right, query mucous plugging or aspiration. 6. Patulous esophagus with scattered retained fluid. 7. Chronic nodular liver contour consistent with cirrhosis. 8. Increased small volume of upper abdominal ascites. 9. Multinodular thyroid, largest nodule 1.7 cm in the right lobe. No record of prior thyroid ultrasound. Recommend thyroid ultrasound if clinically warranted taking into account advanced age. 10. Aortic and coronary artery atherosclerosis. Electronically Signed   By: Almira Bar M.D.   On: 06/27/2022 20:27   DG  Chest Portable 1 View  Result Date: 06/27/2022 CLINICAL DATA:  Cough and weakness EXAM: PORTABLE CHEST 1 VIEW COMPARISON:  01/13/2022 FINDINGS: Leadless pacer noted. Atherosclerotic calcification of the aortic arch. Mild enlargement of the cardiopericardial silhouette. Airway thickening, right greater than left, along with ill-defined density in the right mid lung measuring about 4.7 by 2.1 cm which could be from pneumonia, atelectasis, or fluid along the minor fissure. No blunting of the costophrenic angles. IMPRESSION: 1. Ill-defined density in the right mid lung measuring 4.7 x 2.1 cm which could be from pneumonia, atelectasis, or fluid along the minor  fissure. Given the cough and airway thickening, pneumonia is favored. Followup PA and lateral chest X-ray is recommended in 3-4 weeks following trial of antibiotic therapy to ensure resolution and exclude underlying malignancy. 2. Mild enlargement of the cardiopericardial silhouette. 3. Leadless pacer noted. 4. Atherosclerotic calcification of the aortic arch. Electronically Signed   By: Gaylyn Rong M.D.   On: 06/27/2022 19:14   (Echo, Carotid, EGD, Colonoscopy, ERCP)    Subjective: Pt c/o cough    Discharge Exam: Vitals:   06/29/22 0549 06/29/22 0853  BP: (!) 166/65 (!) 150/73  Pulse: 64 70  Resp: 16 18  Temp: 97.8 F (36.6 C) (!) 97.5 F (36.4 C)  SpO2: 100% 100%   Vitals:   06/29/22 0040 06/29/22 0438 06/29/22 0549 06/29/22 0853  BP:   (!) 166/65 (!) 150/73  Pulse: 78  64 70  Resp:   16 18  Temp:   97.8 F (36.6 C) (!) 97.5 F (36.4 C)  TempSrc:      SpO2:   100% 100%  Weight:  98 kg      General: Pt is alert, awake, not in acute distress Cardiovascular: S1/S2 +, no rubs, no gallops Respiratory: diminished breath sounds b/l  Abdominal: Soft, NT, obese, bowel sounds + Extremities: no cyanosis    The results of significant diagnostics from this hospitalization (including imaging, microbiology, ancillary and laboratory) are listed below for reference.     Microbiology: No results found for this or any previous visit (from the past 240 hour(s)).   Labs: BNP (last 3 results) No results for input(s): "BNP" in the last 8760 hours. Basic Metabolic Panel: Recent Labs  Lab 06/27/22 1601 06/27/22 1905 06/28/22 0457 06/28/22 1231 06/29/22 0434  NA 121*  --  127* 129* 132*  K 5.9*  --  5.3*  --  4.7  CL 88*  --  95*  --  99  CO2 25  --  25  --  25  GLUCOSE 117*  --  80  --  85  BUN 28*  --  26*  --  25*  CREATININE 1.23*  --  1.08*  --  1.06*  CALCIUM 9.4  --  9.5  --  9.1  MG  --  2.3  --   --   --    Liver Function Tests: Recent Labs  Lab  06/27/22 1902 06/28/22 0457  AST 27 25  ALT 13 14  ALKPHOS 93 93  BILITOT 0.5 0.5  PROT 7.0 7.0  ALBUMIN 2.8* 2.8*   No results for input(s): "LIPASE", "AMYLASE" in the last 168 hours. No results for input(s): "AMMONIA" in the last 168 hours. CBC: Recent Labs  Lab 06/27/22 1601 06/28/22 0457 06/29/22 0434  WBC 6.8 6.5 7.2  HGB 9.5* 9.8* 8.5*  HCT 31.4* 31.9* 28.1*  MCV 85.3 84.8 85.9  PLT 342 325 297   Cardiac Enzymes:  Recent Labs  Lab 06/27/22 1905  CKTOTAL 69   BNP: Invalid input(s): "POCBNP" CBG: No results for input(s): "GLUCAP" in the last 168 hours. D-Dimer Recent Labs    06/27/22 1905  DDIMER 2.82*   Hgb A1c No results for input(s): "HGBA1C" in the last 72 hours. Lipid Profile No results for input(s): "CHOL", "HDL", "LDLCALC", "TRIG", "CHOLHDL", "LDLDIRECT" in the last 72 hours. Thyroid function studies No results for input(s): "TSH", "T4TOTAL", "T3FREE", "THYROIDAB" in the last 72 hours.  Invalid input(s): "FREET3" Anemia work up No results for input(s): "VITAMINB12", "FOLATE", "FERRITIN", "TIBC", "IRON", "RETICCTPCT" in the last 72 hours. Urinalysis    Component Value Date/Time   COLORURINE YELLOW (A) 06/29/2022 0556   APPEARANCEUR HAZY (A) 06/29/2022 0556   LABSPEC 1.013 06/29/2022 0556   PHURINE 5.0 06/29/2022 0556   GLUCOSEU NEGATIVE 06/29/2022 0556   HGBUR NEGATIVE 06/29/2022 0556   BILIRUBINUR NEGATIVE 06/29/2022 0556   KETONESUR NEGATIVE 06/29/2022 0556   PROTEINUR NEGATIVE 06/29/2022 0556   NITRITE NEGATIVE 06/29/2022 0556   LEUKOCYTESUR LARGE (A) 06/29/2022 0556   Sepsis Labs Recent Labs  Lab 06/27/22 1601 06/28/22 0457 06/29/22 0434  WBC 6.8 6.5 7.2   Microbiology No results found for this or any previous visit (from the past 240 hour(s)).   Time coordinating discharge: Over 30 minutes  SIGNED:   Charise Killian, MD  [facility] 06/29/2022, 1:51 PM Pager   If 7PM-7AM, please contact  night-coverage [email or web address]

## 2022-06-29 NOTE — TOC Progression Note (Signed)
Transition of Care [facility]) - Progression Note    Patient Details  Name: Katrina Chen MRN: [id number] Date of Birth: 1935-11-16  Transition of [facility]) CM/SW Contact  Marlowe Sax, RN Phone Number: 06/29/2022, 1:33 PM  Clinical Narrative:     Spoke with the daughter [name] She is a long term resident at [facility] Her daughter  asked about the Hold bed fee, I encouraged her to reach out to [facility] and get the information   Expected Discharge Plan: Long Term Nursing Home Barriers to Discharge: No Barriers Identified  Expected Discharge Plan and Services                                               Social Determinants of Health (SDOH) Interventions SDOH Screenings   Food Insecurity: Patient Unable To Answer (06/27/2022)  Housing: Low Risk  (06/27/2022)  Transportation Needs: Patient Unable To Answer (06/27/2022)  Utilities: Patient Unable To Answer (06/27/2022)  Tobacco Use: Low Risk  (06/28/2022)    Readmission Risk Interventions     No data to display

## 2022-06-29 NOTE — Plan of Care (Signed)

## 2022-06-29 NOTE — TOC Progression Note (Addendum)
Transition of Care [facility]) - Progression Note    Patient Details  Name: Katrina Chen MRN: [id number] Date of Birth: 11/24/1935  Transition of Care [place] [facility]) CM/SW Contact  Marlowe Sax, RN Phone Number: 06/29/2022, 2:26 PM  Clinical Narrative:     Called EMS and arranged transport Going to [facility] room [name] her daughter is aware  Expected Discharge Plan: Long Term Nursing Home Barriers to Discharge: No Barriers Identified  Expected Discharge Plan and Services         Expected Discharge Date: 06/29/22                                     Social Determinants of Health (SDOH) Interventions SDOH Screenings   Food Insecurity: Patient Unable To Answer (06/27/2022)  Housing: Low Risk  (06/27/2022)  Transportation Needs: Patient Unable To Answer (06/27/2022)  Utilities: Patient Unable To Answer (06/27/2022)  Tobacco Use: Low Risk  (06/28/2022)    Readmission Risk Interventions     No data to display

## 2022-06-29 NOTE — Plan of Care (Signed)
Patient discharged per MD orders at this time.All dc instructions, education and medications reviewed with the patient. Pt expressed understanding and will comply with dc instructions.follow up appointments was also communicated to the patient.Pt was discharged to the [facility] per order.Legal guardian,Ms Bridgett Larsson notified of discharged per protocol.report was called to staff nurse,Christi before transport.Pt was transported by 2 [facility] personnel on a stretcher.

## 2022-06-29 NOTE — Progress Notes (Signed)
[employer], [employer] [facility] Hospital Liaison Note  Received request from Transitions of Care Manager, Ashby Dawes, for hospice services at LTC after discharge. Chart and patient information under review by [facility] physician.   Spoke with the daughter, Bridgett Larsson, to initiate education related to hospice philosophy, services, and team approach to care. Daughter verbalized understanding of information given. Per discussion, the plan is for patient to discharge LTC via EMS once cleared to DC.    Facility to provide needed DME.    Please send signed and completed DNR LTC with patient/family. Please provide prescriptions at discharge as needed to ensure ongoing symptom management.    [facility] information and contact numbers given to family & above information shared with TOC.   Please call with any questions/concerns.    Thank you for the opportunity to participate in this patient's care.  [facility] Liaison [phone]

## 2022-06-29 NOTE — Consult Note (Signed)
Consultation Note Date: 06/29/2022 at 1030  Patient Name: Katrina Chen  DOB: 1935-10-24  MRN: [id number]  Age / Sex: 87 y.o., adult  PCP: Wardell Honour, FNP Referring Physician: Charise Killian, MD  Reason for Consultation: Establishing goals of care  HPI/Patient Profile: 87 y.o. adult  with past medical history of vascular dementia, A-fib (Eliquis)), CKD, HTN, HLD, iron deficiency anemia, and recent COVID infection admitted on 06/27/2022 with weakness and poor p.o. intake for 3 days.   Patient is being treated for pneumonia with IV ceftriaxone, azithromycin, and bronchodilators.  Clinical Assessment and Goals of Care: I have reviewed medical records including EPIC notes, labs and imaging, assessed the patient and then met with patient, her daughter [name], her son, and son significant other at bedside to discuss diagnosis prognosis, GOC, EOL wishes, disposition and options.  I introduced Palliative Medicine as specialized medical care for people living with serious illness. It focuses on providing relief from the symptoms and stress of a serious illness. The goal is to improve quality of life for both the patient and the family.  We discussed a brief life review of the patient.  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  As far as functional and nutritional status family notes a significant decline over the last year.  Patient has had 5 falls in her nursing facility, with several emergency room visits.  They state patient can no longer feed herself and will take only bites of offered food.  She has not been able to walk or be mobile out of the bed without use of a Hoyer lift for several months.  We discussed patient's current illness and what it means in the larger context of patient's on-going  co-morbidities.  Natural disease trajectory discussed.  Education provided that dementia is a chronic, progressive, and irreversible disease that is often accelerated by acute illnesses and hospitalizations.  I attempted to elicit values and goals of care important to the patient.  Family would like for patient to return to facility and be kept comfortable.  The difference between aggressive medical intervention and comfort care was considered in light of the patient's goals of care.   We discussed patient's previous advance care planning document.  Advance directives, concepts specific to code status, artificial feeding and hydration, and rehospitalization were considered and discussed. Please see MOST form in [facility]yn[facility] tab.  Family still in agreement that they would not want aggressive medical interventions to sustain patient's life artificially.  CODE STATUS again confirmed as DNR. Family's wishes remain the same as follows:  Cardiopulmonary Resuscitation: Do Not Attempt Resuscitation (DNR/No CPR)  Medical Interventions: Comfort Measures: Keep clean, warm, and dry. Use medication by any route, positioning, wound care, and other measures to relieve pain and suffering. Use oxygen, suction and manual treatment of airway obstruction as needed for comfort. Do not transfer to the hospital unless comfort needs cannot be met in current location.  Antibiotics: No antibiotics (use other measures to relieve symptoms)  IV Fluids: No IV fluids (provide other measures to ensure comfort)  Feeding Tube: No feeding tube       Hospice and hospice philosophy discussed in detail.  Family shares they would like to enact patient's hospice benefits and have hospice services at discharge.   Discussed with patient/family the importance of continued conversation with family and the medical providers regarding overall plan of care and treatment options, ensuring decisions are within the context of the patient's values and GOCs.     Attending Dr. Mayford Knife, [facility], and nursing made aware of above discussions.   Goals clear. Plan for d/c to LTC with hospice services to follow. TOC to offer choice of Hospice Agencies.   Symptom burden low. No adjustment to medication needed at this time.   Family given PMT contact info and was encouraged to call with any future palliative needs.  Given that goals and plan are set, PMT will shadow and monitor the patient peripherally.  Please reengage with PMT if goals change, at patient/family's request, or if patient's health deteriorates during this hospitalization.  Primary Decision Maker NEXT OF KIN  Physical Exam Vitals reviewed.  Constitutional:      General: She is not in acute distress.    Appearance: She is obese. She is not ill-appearing.  HENT:     Head: Normocephalic.     Mouth/Throat:     Mouth: Mucous membranes are moist.  Eyes:     Pupils: Pupils are equal, round, and reactive to light.  Cardiovascular:     Rate and Rhythm: Normal rate.     Pulses: Normal pulses.  Pulmonary:     Effort: Pulmonary effort is normal.  Abdominal:     Palpations: Abdomen is soft.  Musculoskeletal:     Comments: Generalized weakness  Skin:    General: Skin is warm and dry.  Neurological:     Mental Status: She is alert.     Palliative Assessment/Data: 20%     Thank you for this consult. Palliative medicine will continue to follow and assist holistically.   Time Total: 75 minutes Greater than 50%  of this time was spent counseling and coordinating care related to the above assessment and plan.  Signed by: Georgiann Cocker, DNP, FNP-BC Palliative Medicine    Please contact Palliative Medicine Team phone at [phone] for questions and concerns.  For individual provider: See Loretha Stapler

## 2022-06-30 LAB — URINE CULTURE

## 2022-08-20 IMAGING — CR DG CHEST 2V
1 series · 2 of 2 positions shown · non-contrast
Comparison: June 26, 2021.

CLINICAL DATA: Dysphagia.

EXAM:
CHEST - 2 VIEW

[Series 1: dg chest 2 view · 0.14mm/px · 2 of 2 slices shown]
[im 1/2]
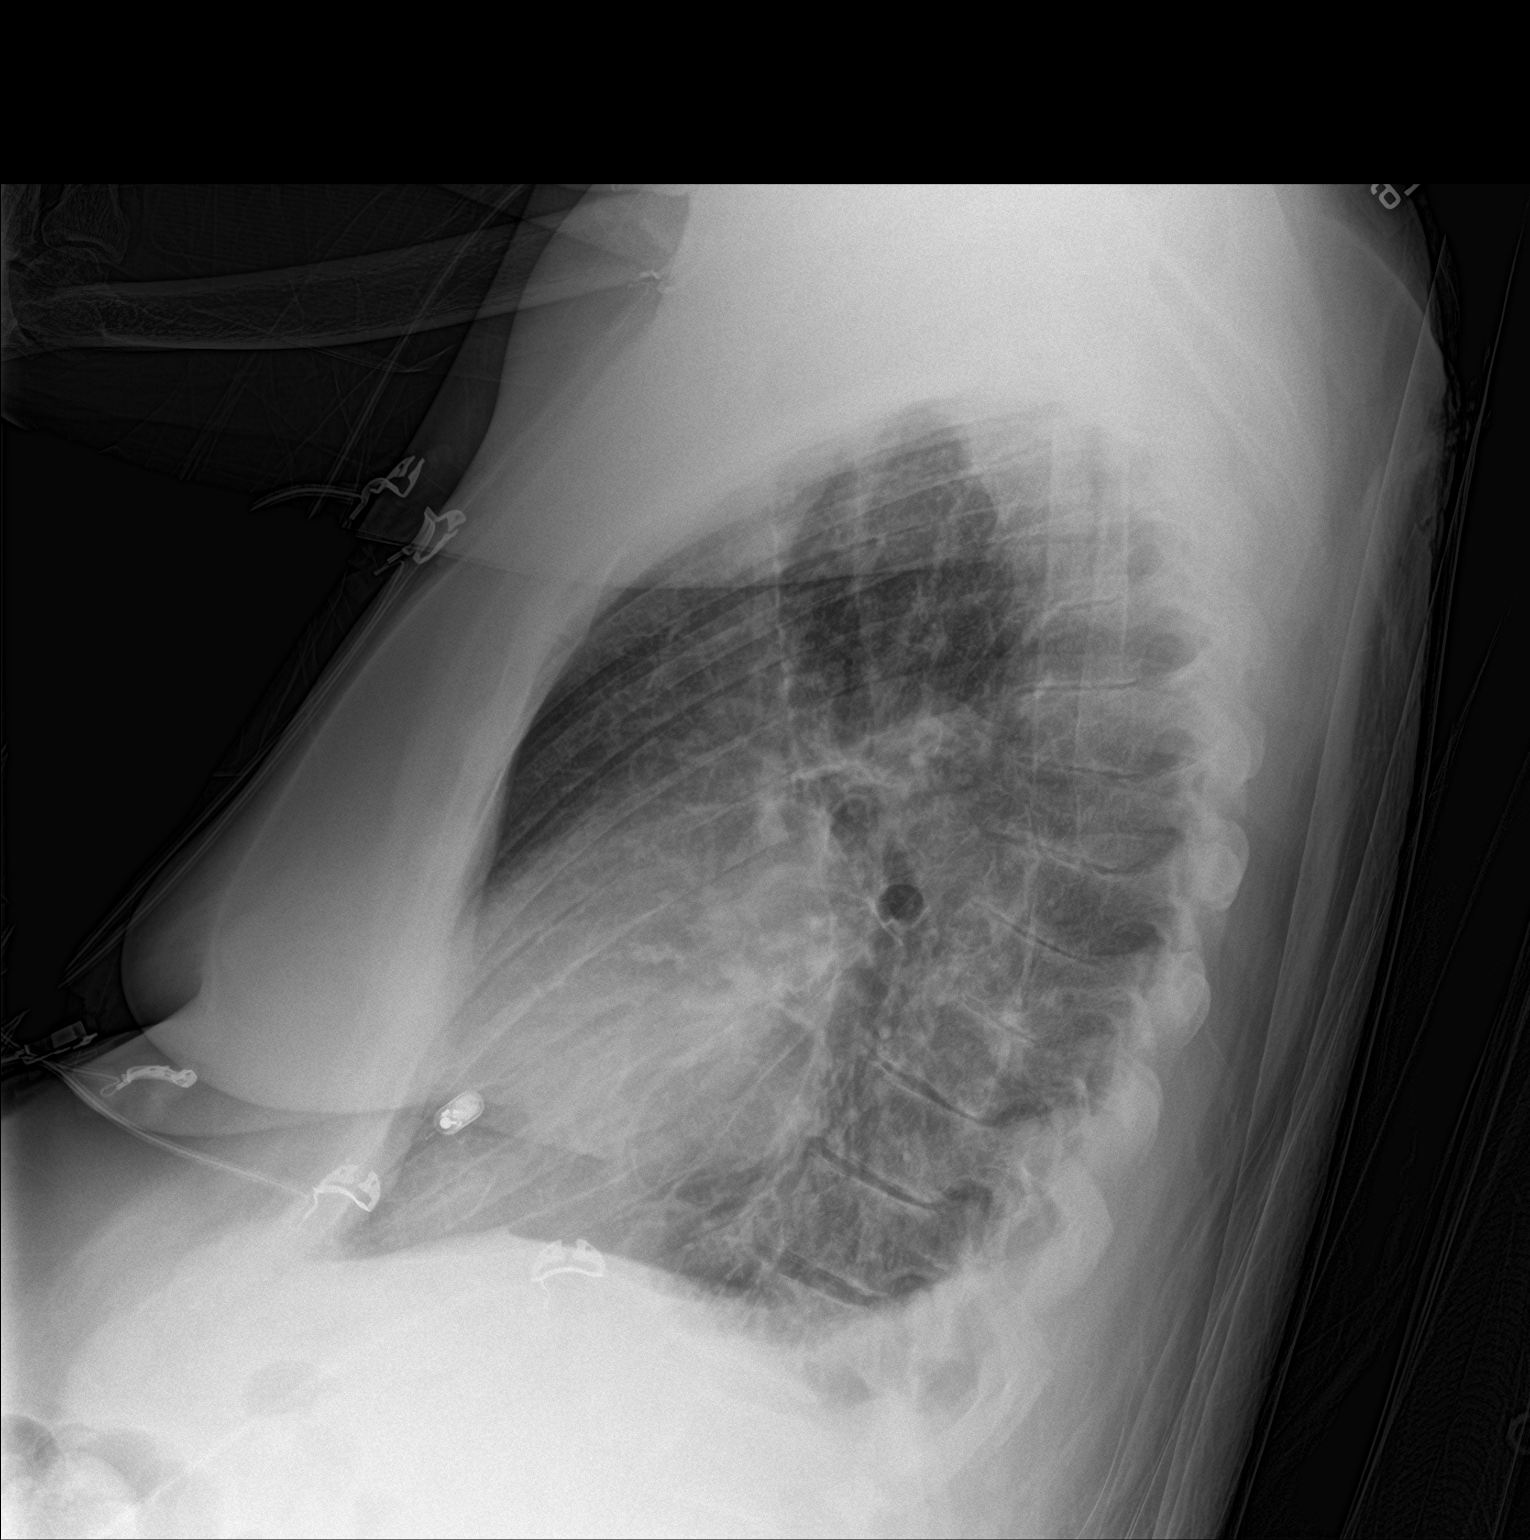
[im 2/2]
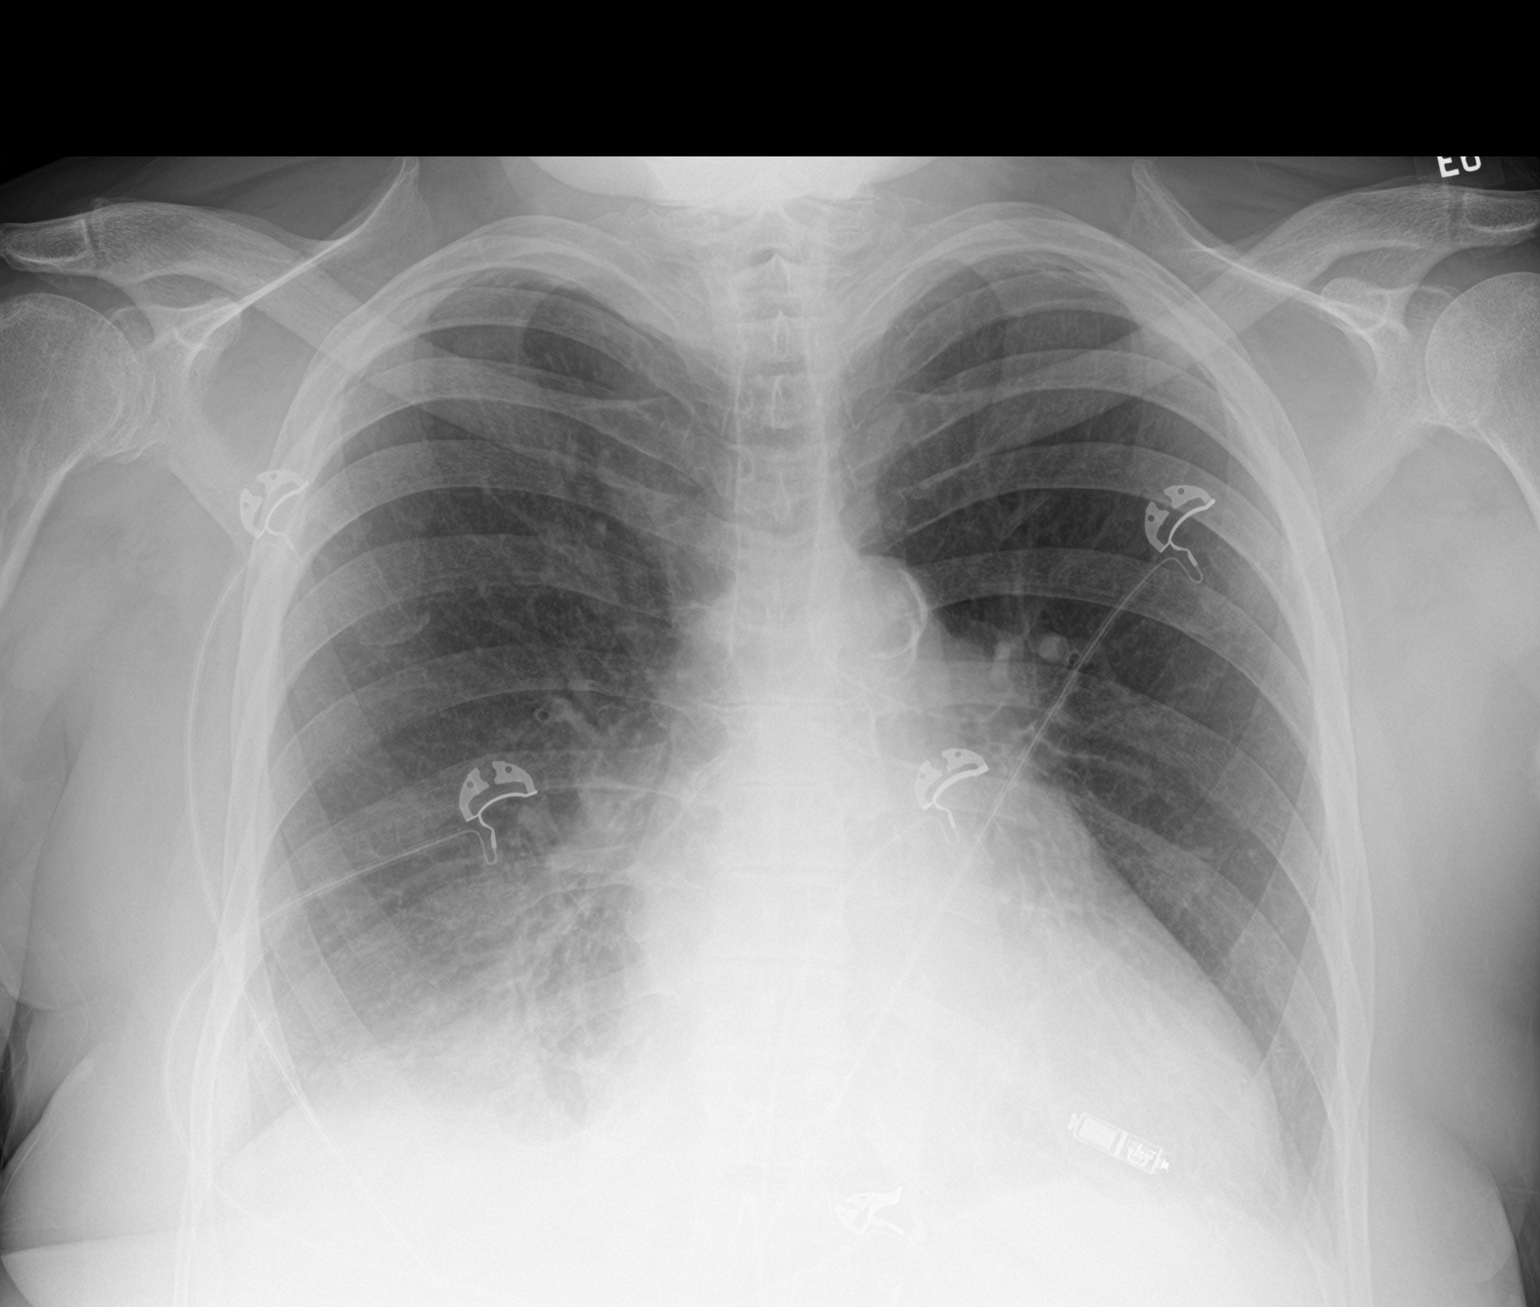

[2 of 2 positions shown; findings below may reference images not displayed]

FINDINGS: Stable cardiomediastinal silhouette. Left lung is clear. Mild right
basilar atelectasis or infiltrate is noted with small right pleural
effusion. Bony thorax is unremarkable.
IMPRESSION: Mild right basilar atelectasis or infiltrate is noted with small
right pleural effusion.

Aortic Atherosclerosis ([id number]).

## 2022-08-20 IMAGING — CT CT CHEST W/O CM
2 of 4 series · 15 of 36 positions shown, 18 images · non-contrast
Comparison: Chest CT June 26, 2021.

CLINICAL DATA: Pleural effusion, malignancy suspected



[Series 2: chest wo · axial · 0.76mm/px · z∈[+1410,+1708]mm · 12 of 177 slices shown, 15 images]
[im 14/177  mediastinal]
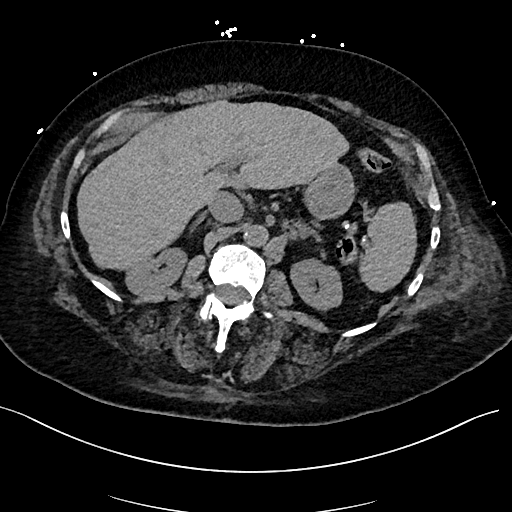
[im 14/177  lung]
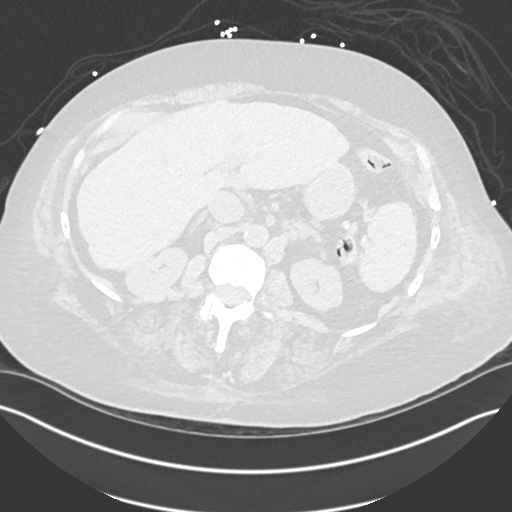
[im 28/177  lung]
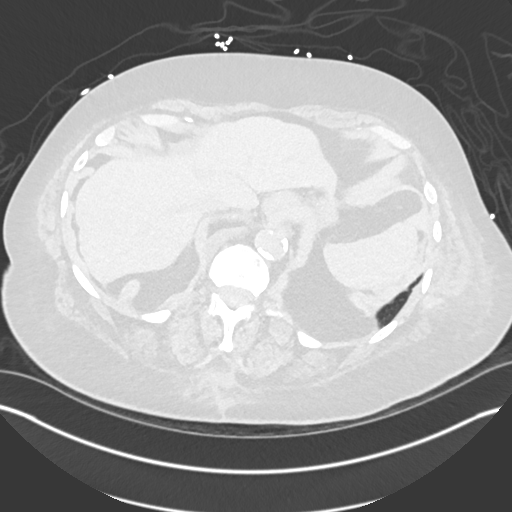
[im 41/177  lung]
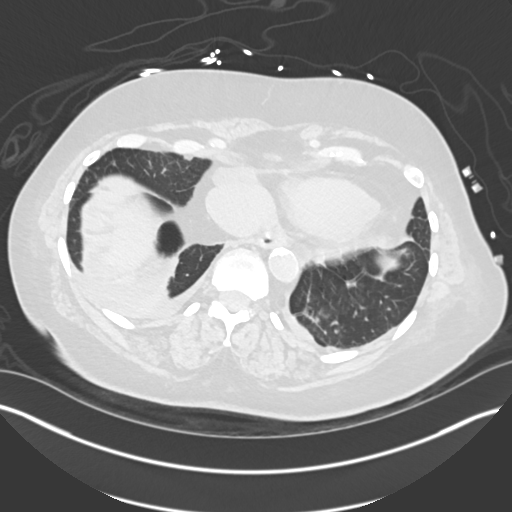
[im 55/177  lung]
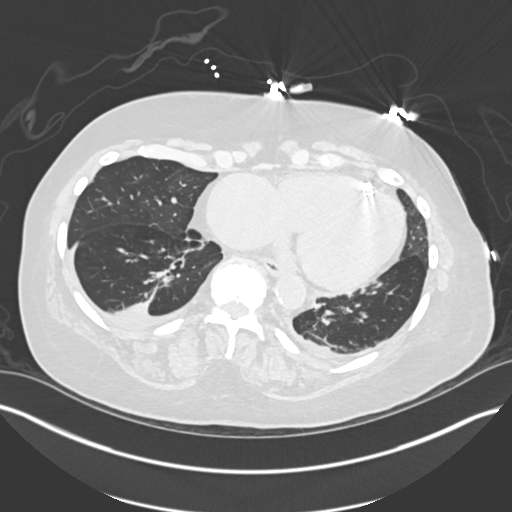
[im 68/177  mediastinal]
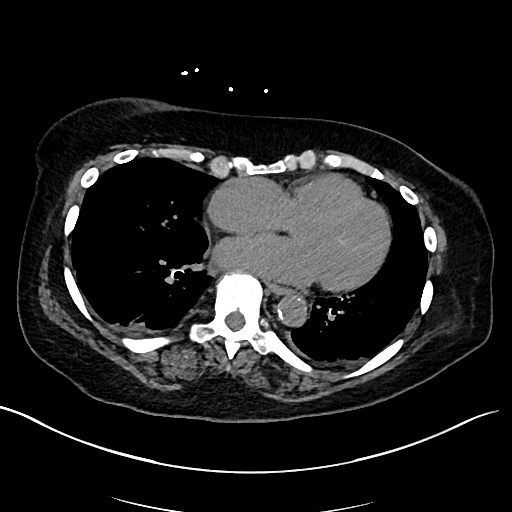
[im 68/177  lung]
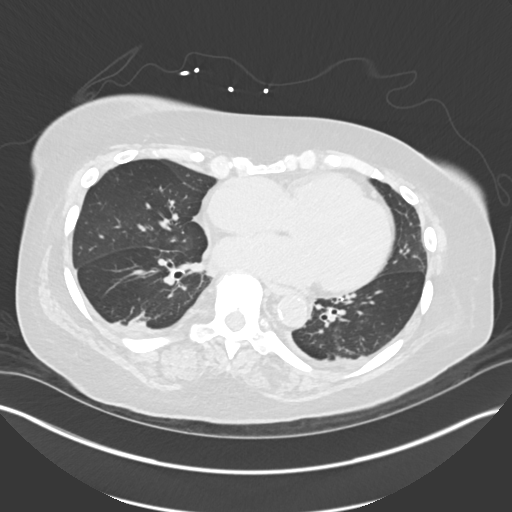
[im 82/177  lung]
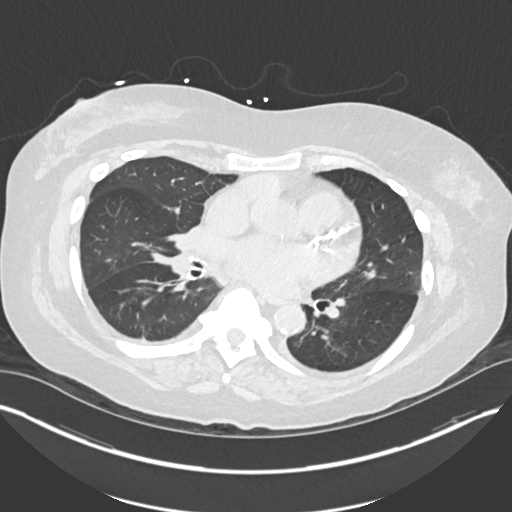
[im 95/177  lung]
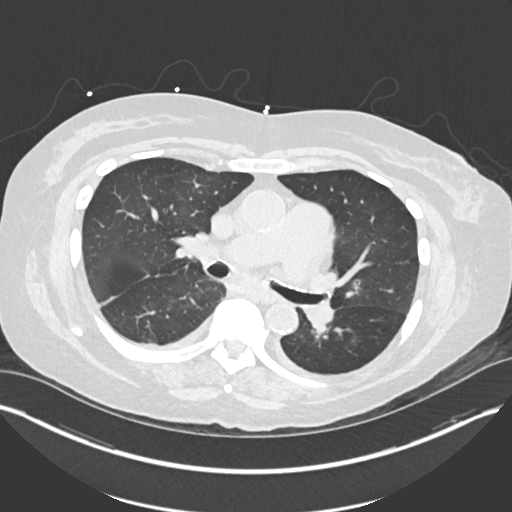
[im 109/177  lung]
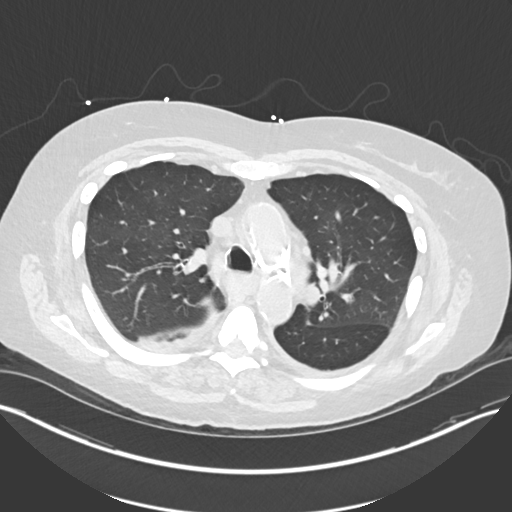
[im 122/177  mediastinal]
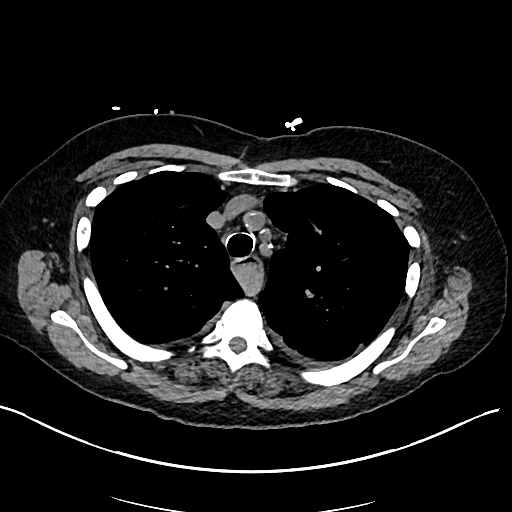
[im 122/177  lung]
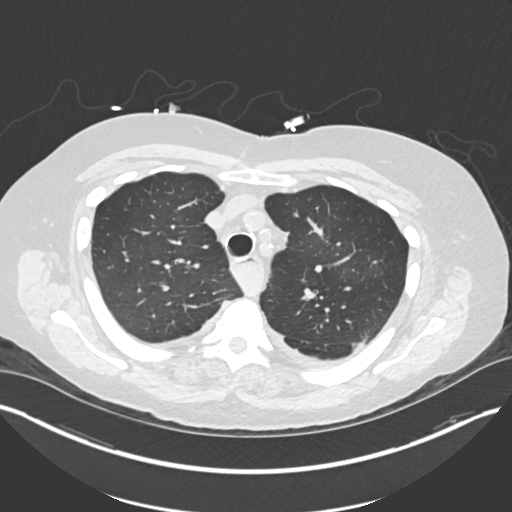
[im 136/177  lung]
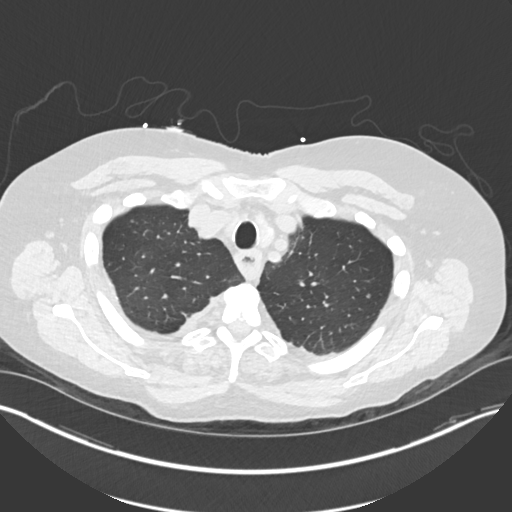
[im 149/177  lung]
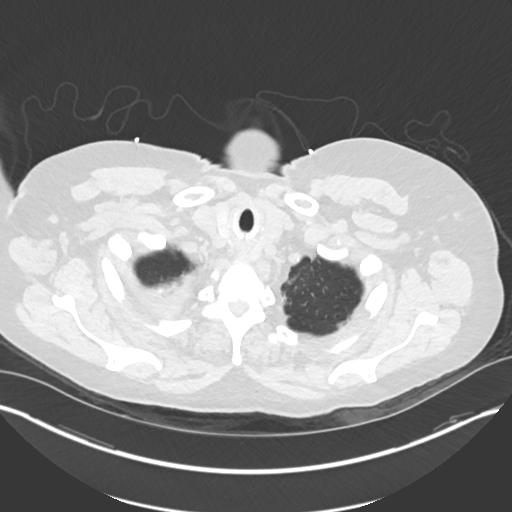
[im 163/177  lung]
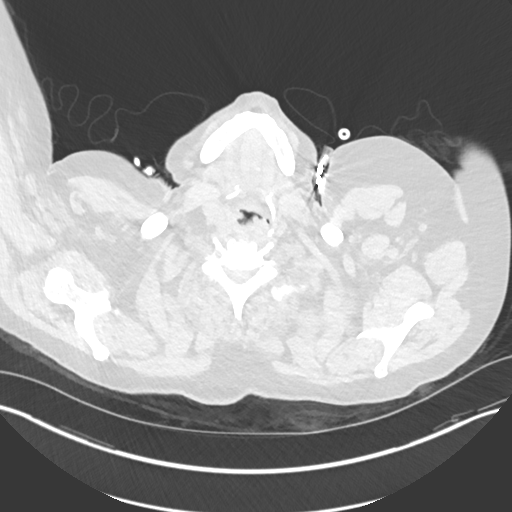

[Series 5: cor · coronal · 0.70mm/px · 3 of 141 slices shown]
[im 29/141  lung]
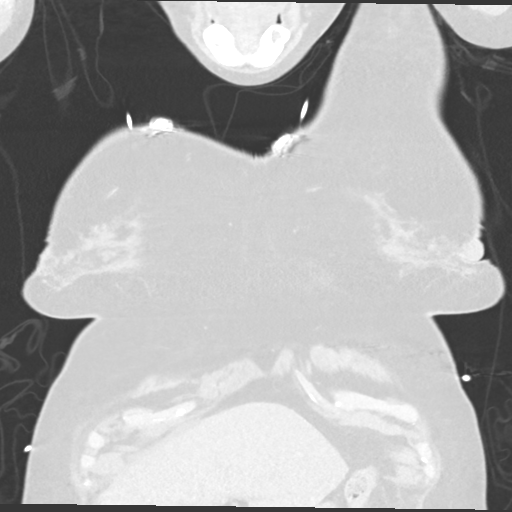
[im 57/141  lung]
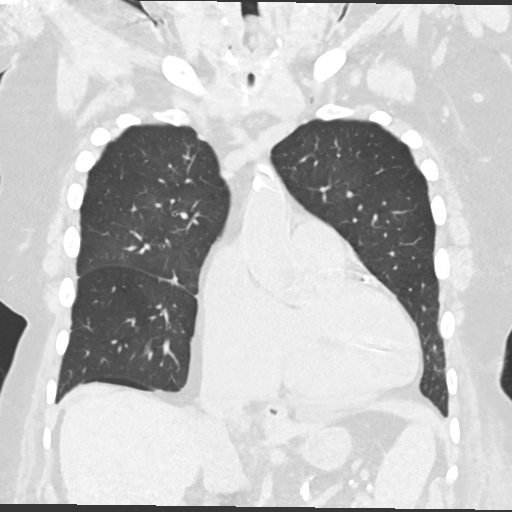
[im 85/141  lung]
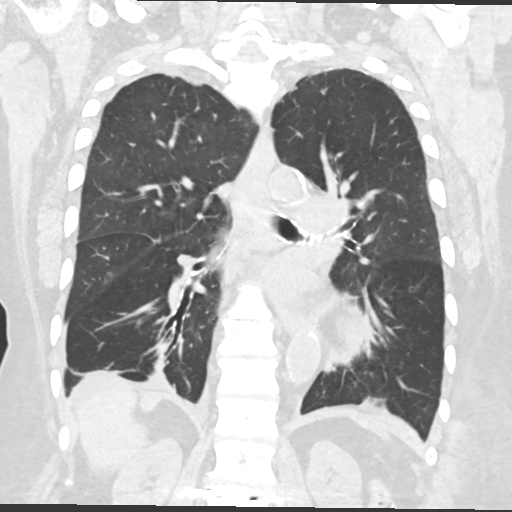

[15 of 36 positions shown; findings below may reference images not displayed]

FINDINGS: Cardiovascular: Coronary artery and aortic calcific
atherosclerosis. Normal heart size. No pericardial effusion.

Mediastinum/Nodes: No enlarged mediastinal or axillary lymph nodes.
Filling defect within the lower esophagus. Redemonstrated 2 cm
thyroid nodule.

Lungs/Pleura: Improved patchy opacities in the right middle lobe,
right lower lobe, and left lower lobe. Similar small right and trace
left pleural effusions. No pneumothorax.

Upper Abdomen: No acute abnormality.

Musculoskeletal: Multilevel degenerative change in the spine. No
evidence of acute abnormality. Partially imaged lumbar fusion.
IMPRESSION: 1. Improved patchy and nodular opacities in the right middle lobe,
right lower lobe, and left lower lobe, suggesting improving
pneumonia and/or aspiration. To fully exclude malignancy/nodules,
recommend follow-up chest CT in 3 months to ensure full resolution.
2. Soft tissue or secretions/debris filling the lower esophagus.
Recommend endoscopy for direct visualization and exclusion of
malignancy. Findings place the patient at risk for aspiration.
3. Redemonstrated 2 cm thyroid nodule. Recommend thyroid ultrasound
(ref: [HOSPITAL]. [DATE]): 143-50).

## 2023-08-27 ENCOUNTER — Other Ambulatory Visit: Payer: Self-pay

## 2023-08-27 ENCOUNTER — Emergency Department

## 2023-08-27 ENCOUNTER — Inpatient Hospital Stay
Admission: EM | Admit: 2023-08-27 | Discharge: 2023-08-31 | DRG: 177 | Disposition: A | Discharge status: Skilled Nursing Facility | Source: Skilled Nursing Facility | Attending: Internal Medicine | Admitting: Internal Medicine

## 2023-08-27 DIAGNOSIS — I2510 Atherosclerotic heart disease of native coronary artery without angina pectoris: Secondary | ICD-10-CM | POA: Diagnosis present

## 2023-08-27 DIAGNOSIS — D509 Iron deficiency anemia, unspecified: Secondary | ICD-10-CM | POA: Diagnosis present

## 2023-08-27 DIAGNOSIS — G9341 Metabolic encephalopathy: Secondary | ICD-10-CM | POA: Diagnosis present

## 2023-08-27 DIAGNOSIS — E860 Dehydration: Secondary | ICD-10-CM | POA: Diagnosis present

## 2023-08-27 DIAGNOSIS — I10 Essential (primary) hypertension: Secondary | ICD-10-CM | POA: Diagnosis not present

## 2023-08-27 DIAGNOSIS — R471 Dysarthria and anarthria: Secondary | ICD-10-CM | POA: Diagnosis present

## 2023-08-27 DIAGNOSIS — I130 Hypertensive heart and chronic kidney disease with heart failure and stage 1 through stage 4 chronic kidney disease, or unspecified chronic kidney disease: Secondary | ICD-10-CM | POA: Diagnosis present

## 2023-08-27 DIAGNOSIS — R600 Localized edema: Secondary | ICD-10-CM | POA: Diagnosis present

## 2023-08-27 DIAGNOSIS — E871 Hypo-osmolality and hyponatremia: Secondary | ICD-10-CM | POA: Diagnosis present

## 2023-08-27 DIAGNOSIS — R54 Age-related physical debility: Secondary | ICD-10-CM | POA: Diagnosis present

## 2023-08-27 DIAGNOSIS — F3113 Bipolar disorder, current episode manic without psychotic features, severe: Secondary | ICD-10-CM | POA: Diagnosis present

## 2023-08-27 DIAGNOSIS — F0153 Vascular dementia, unspecified severity, with mood disturbance: Secondary | ICD-10-CM | POA: Diagnosis present

## 2023-08-27 DIAGNOSIS — M48061 Spinal stenosis, lumbar region without neurogenic claudication: Secondary | ICD-10-CM | POA: Diagnosis present

## 2023-08-27 DIAGNOSIS — E785 Hyperlipidemia, unspecified: Secondary | ICD-10-CM | POA: Diagnosis present

## 2023-08-27 DIAGNOSIS — Z8249 Family history of ischemic heart disease and other diseases of the circulatory system: Secondary | ICD-10-CM

## 2023-08-27 DIAGNOSIS — E669 Obesity, unspecified: Secondary | ICD-10-CM | POA: Diagnosis present

## 2023-08-27 DIAGNOSIS — J690 Pneumonitis due to inhalation of food and vomit: Secondary | ICD-10-CM | POA: Diagnosis present

## 2023-08-27 DIAGNOSIS — Z7401 Bed confinement status: Secondary | ICD-10-CM

## 2023-08-27 DIAGNOSIS — G8929 Other chronic pain: Secondary | ICD-10-CM | POA: Diagnosis present

## 2023-08-27 DIAGNOSIS — I482 Chronic atrial fibrillation, unspecified: Secondary | ICD-10-CM | POA: Diagnosis present

## 2023-08-27 DIAGNOSIS — Z6836 Body mass index (BMI) 36.0-36.9, adult: Secondary | ICD-10-CM

## 2023-08-27 DIAGNOSIS — Z79899 Other long term (current) drug therapy: Secondary | ICD-10-CM

## 2023-08-27 DIAGNOSIS — R4182 Altered mental status, unspecified: Principal | ICD-10-CM

## 2023-08-27 DIAGNOSIS — Z1152 Encounter for screening for COVID-19: Secondary | ICD-10-CM

## 2023-08-27 DIAGNOSIS — Z66 Do not resuscitate: Secondary | ICD-10-CM | POA: Diagnosis present

## 2023-08-27 DIAGNOSIS — E875 Hyperkalemia: Secondary | ICD-10-CM | POA: Diagnosis present

## 2023-08-27 DIAGNOSIS — Z7901 Long term (current) use of anticoagulants: Secondary | ICD-10-CM

## 2023-08-27 DIAGNOSIS — N1831 Chronic kidney disease, stage 3a: Secondary | ICD-10-CM | POA: Diagnosis present

## 2023-08-27 DIAGNOSIS — Z8701 Personal history of pneumonia (recurrent): Secondary | ICD-10-CM

## 2023-08-27 DIAGNOSIS — K7460 Unspecified cirrhosis of liver: Secondary | ICD-10-CM | POA: Diagnosis present

## 2023-08-27 DIAGNOSIS — Z888 Allergy status to other drugs, medicaments and biological substances status: Secondary | ICD-10-CM

## 2023-08-27 DIAGNOSIS — Z950 Presence of cardiac pacemaker: Secondary | ICD-10-CM

## 2023-08-27 DIAGNOSIS — J189 Pneumonia, unspecified organism: Secondary | ICD-10-CM

## 2023-08-27 DIAGNOSIS — F419 Anxiety disorder, unspecified: Secondary | ICD-10-CM | POA: Diagnosis present

## 2023-08-27 LAB — COMPREHENSIVE METABOLIC PANEL WITH GFR
ALT: 12 U/L (ref 0–44)
AST: 15 U/L (ref 15–41)
Albumin: 2.6 g/dL — ABNORMAL LOW (ref 3.5–5.0)
Alkaline Phosphatase: 103 U/L (ref 38–126)
Anion gap: 8 (ref 5–15)
BUN: 26 mg/dL — ABNORMAL HIGH (ref 8–23)
CO2: 25 mmol/L (ref 22–32)
Calcium: 9.5 mg/dL (ref 8.9–10.3)
Chloride: 98 mmol/L (ref 98–111)
Creatinine, Ser: 1.02 mg/dL — ABNORMAL HIGH (ref 0.44–1.00)
GFR, Estimated: 54 mL/min — ABNORMAL LOW (ref >60)
Glucose, Bld: 100 mg/dL — ABNORMAL HIGH (ref 70–99)
Potassium: 5.4 mmol/L — ABNORMAL HIGH (ref 3.5–5.1)
Sodium: 131 mmol/L — ABNORMAL LOW (ref 135–145)
Total Bilirubin: 0.6 mg/dL (ref 0.0–1.2)
Total Protein: 7.3 g/dL (ref 6.5–8.1)

## 2023-08-27 LAB — URINALYSIS, W/ REFLEX TO CULTURE (INFECTION SUSPECTED)
Bilirubin Urine: NEGATIVE
Glucose, UA: NEGATIVE mg/dL
Hgb urine dipstick: NEGATIVE
Ketones, ur: NEGATIVE mg/dL
Leukocytes,Ua: NEGATIVE
Nitrite: NEGATIVE
Protein, ur: NEGATIVE mg/dL
Specific Gravity, Urine: 1.009 (ref 1.005–1.030)
pH: 5 (ref 5.0–8.0)

## 2023-08-27 LAB — CBC
HCT: 31.3 % — ABNORMAL LOW (ref 36.0–46.0)
Hemoglobin: 9.5 g/dL — ABNORMAL LOW (ref 12.0–15.0)
MCH: 25 pg — ABNORMAL LOW (ref 26.0–34.0)
MCHC: 30.4 g/dL (ref 30.0–36.0)
MCV: 82.4 fL (ref 80.0–100.0)
Platelets: 401 10*3/uL — ABNORMAL HIGH (ref 150–400)
RBC: 3.8 MIL/uL — ABNORMAL LOW (ref 3.87–5.11)
RDW: 15.3 % (ref 11.5–15.5)
WBC: 8.9 10*3/uL (ref 4.0–10.5)
nRBC: 0 % (ref 0.0–0.2)

## 2023-08-27 LAB — PROTIME-INR
INR: 1.2 (ref 0.8–1.2)
Prothrombin Time: 15.6 s — ABNORMAL HIGH (ref 11.4–15.2)

## 2023-08-27 LAB — RESP PANEL BY RT-PCR (RSV, FLU A&B, COVID)  RVPGX2
Influenza A by PCR: NEGATIVE
Influenza B by PCR: NEGATIVE
Resp Syncytial Virus by PCR: NEGATIVE
SARS Coronavirus 2 by RT PCR: NEGATIVE

## 2023-08-27 LAB — LACTIC ACID, PLASMA: Lactic Acid, Venous: 0.7 mmol/L (ref 0.5–1.9)

## 2023-08-27 LAB — AMMONIA: Ammonia: 29 umol/L (ref 9–35)

## 2023-08-27 LAB — BRAIN NATRIURETIC PEPTIDE: B Natriuretic Peptide: 520.1 pg/mL — ABNORMAL HIGH (ref 0.0–100.0)

## 2023-08-27 LAB — APTT: aPTT: 42 s — ABNORMAL HIGH (ref 24–36)

## 2023-08-27 LAB — TROPONIN I (HIGH SENSITIVITY): Troponin I (High Sensitivity): 10 ng/L (ref <18)

## 2023-08-27 LAB — VALPROIC ACID LEVEL: Valproic Acid Lvl: 20 ug/mL — ABNORMAL LOW (ref 50–100)

## 2023-08-27 MED ORDER — METOPROLOL TARTRATE 5 MG/5ML IV SOLN: 2.5000 mg | INTRAVENOUS | Status: DC | PRN

## 2023-08-27 MED ORDER — FUROSEMIDE 10 MG/ML IJ SOLN
40.0000 mg | Freq: Two times a day (BID) | INTRAMUSCULAR | Status: DC
  Administered 2023-08-27 – 2023-08-28 (×3): 40 mg via INTRAVENOUS
  Filled 2023-08-27 (×3): qty 4

## 2023-08-27 MED ORDER — HYDRALAZINE HCL 20 MG/ML IJ SOLN: 5.0000 mg | INTRAMUSCULAR | Status: DC | PRN

## 2023-08-27 MED ORDER — ONDANSETRON HCL 4 MG/2ML IJ SOLN: 4.0000 mg | Freq: Three times a day (TID) | INTRAMUSCULAR | Status: DC | PRN

## 2023-08-27 MED ORDER — FUROSEMIDE 10 MG/ML IJ SOLN: 40.0000 mg | Freq: Once | INTRAMUSCULAR | Status: DC

## 2023-08-27 MED ORDER — SODIUM CHLORIDE 0.9 % IV BOLUS: 500.0000 mL | Freq: Once | INTRAVENOUS | Status: DC

## 2023-08-27 MED ORDER — VALPROATE SODIUM 100 MG/ML IV SOLN
500.0000 mg | Freq: Once | INTRAVENOUS | Status: AC
  Administered 2023-08-27: 500 mg via INTRAVENOUS
  Filled 2023-08-27: qty 5

## 2023-08-27 MED ORDER — DM-GUAIFENESIN ER 30-600 MG PO TB12: 1.0000 | ORAL_TABLET | Freq: Two times a day (BID) | ORAL | Status: DC | PRN

## 2023-08-27 MED ORDER — SODIUM CHLORIDE 0.9 % IV SOLN
1.0000 g | Freq: Once | INTRAVENOUS | Status: DC
  Administered 2023-08-27: 1 g via INTRAVENOUS
  Filled 2023-08-27: qty 10

## 2023-08-27 MED ORDER — SODIUM ZIRCONIUM CYCLOSILICATE 5 G PO PACK
5.0000 g | PACK | Freq: Once | ORAL | Status: DC
  Filled 2023-08-27: qty 1

## 2023-08-27 MED ORDER — SODIUM CHLORIDE 1 G PO TABS
1.0000 g | ORAL_TABLET | Freq: Two times a day (BID) | ORAL | Status: DC
  Administered 2023-08-28 – 2023-08-31 (×7): 1 g via ORAL
  Filled 2023-08-27 (×7): qty 1

## 2023-08-27 MED ORDER — SODIUM CHLORIDE 0.9 % IV SOLN
100.0000 mg | Freq: Once | INTRAVENOUS | Status: DC
  Filled 2023-08-27: qty 100

## 2023-08-27 MED ORDER — SODIUM CHLORIDE 0.9 % IV BOLUS
500.0000 mL | Freq: Once | INTRAVENOUS | Status: AC
  Administered 2023-08-27: 500 mL via INTRAVENOUS

## 2023-08-27 MED ORDER — ALBUTEROL SULFATE (2.5 MG/3ML) 0.083% IN NEBU: 2.5000 mg | INHALATION_SOLUTION | RESPIRATORY_TRACT | Status: DC | PRN

## 2023-08-27 MED ORDER — SODIUM CHLORIDE 0.9 % IV SOLN
3.0000 g | Freq: Four times a day (QID) | INTRAVENOUS | Status: DC
  Administered 2023-08-27 – 2023-08-31 (×14): 3 g via INTRAVENOUS
  Filled 2023-08-27 (×16): qty 8

## 2023-08-27 MED ORDER — ACETAMINOPHEN 325 MG PO TABS
650.0000 mg | ORAL_TABLET | Freq: Four times a day (QID) | ORAL | Status: DC | PRN
  Administered 2023-08-29 – 2023-08-30 (×2): 650 mg via ORAL
  Filled 2023-08-27 (×2): qty 2

## 2023-08-27 MED ORDER — SODIUM CHLORIDE 0.9 % IV SOLN: INTRAVENOUS | Status: AC

## 2023-08-27 MED ORDER — HYDRALAZINE HCL 20 MG/ML IJ SOLN: 10.0000 mg | INTRAMUSCULAR | Status: DC | PRN

## 2023-08-27 NOTE — ED Notes (Signed)
 CCMD saved a cardiac strip of afib with wide complex change.

## 2023-08-27 NOTE — ED Notes (Signed)
 Pt brief was changed and pure wick placed due to pt receiving lasix .

## 2023-08-27 NOTE — ED Notes (Signed)
 Md notified about afib cardiac strip and NPO status

## 2023-08-27 NOTE — ED Notes (Signed)
 CCMD called for cardiac monitoring.

## 2023-08-27 NOTE — H&P (Addendum)
 History and Physical    Katrina Chen QMG:[id number] DOB: 26-Dec-1935 DOA: 08/27/2023  Referring MD/NP/PA:   PCP: Marlinda Simon, FNP   Patient coming from:  The patient is coming from SNF   Chief Complaint: Altered mental status and cough  HPI: Katrina Chen is a 88 y.o. adult with medical history significant of bedbound, A-fib on Eliquis , s/p of PPM, HTN, HLD, CAD, dementia, bipolar, depression, CKD-3, anemia, liver cirrhosis, lumbar spinal stenosis with chronic back pain, who presents with altered mental status and cough.  Patient has altered mental status; cannot provide any medical history.  Per her daughter at bedside, at her normal baseline, patient is alert oriented x 3, able to talk to her daughter.  She cannot walk and is bedbound.  In the past 3 days, patient has been confused; sticking out her tongue frequently per her daughter. She has decreased oral intake.  She has cough, seems to have mucus in throat, but could not coughed up, no respiratory distress.  Does not seem to have chest pain per her daughter.  No active nausea, vomiting, diarrhea noted.  Does not seem to have abdominal pain.  Not sure if patient has symptoms of UTI.  When I saw patient in ED, patient is not arousable, not following command, not orientated x 3, slightly move extremities on painful stimuli.  Data reviewed independently and ED Course: pt was found to have WBC 8.7, BNP 520, renal function close to baseline, potassium 5.4, sodium 131, troponin 10, subtherapeutic valproic acid  20, ammonia level 29. Temperature normal, blood pressure 166/60, heart rate 69, RR 19, oxygen sats are 92-93% on room air.  CT of head negative for acute intracranial abnormalities.  Patient is admitted to telemetry bed as inpatient.  Chest X-ray: Mild congestive heart failure.   Small right and probable small left pleural effusion. Right greater than left lower lung airspace disease could represent atelectasis or concurrent infection.    Aortic Atherosclerosis (ICD10-I70.0).   Mild limitations as detailed above.     EKG: I have personally reviewed.  A-fib, QTc 477, LAD, poor progression, anteroseptal infarction pattern   Review of Systems: Could not be reviewed accurately due to altered mental status.   Allergy:  Allergies  Allergen Reactions   Iodine Swelling   Amlodipine Other (See Comments)   Nifedipine Swelling and Other (See Comments)   Iodinated Contrast Media    Lisinopril Swelling   Quinapril Swelling   Shellfish Allergy    Simvastatin Swelling    Past Medical History:  Diagnosis Date   A-fib (HCC)    Anxiety    Chronic kidney disease    Depression    Hyperlipidemia    Hypertension    Iron  deficiency    Vascular dementia [facility])     Past Surgical History:  Procedure Laterality Date   ESOPHAGOGASTRODUODENOSCOPY (EGD) WITH PROPOFOL  N/A 07/01/2021   Procedure: ESOPHAGOGASTRODUODENOSCOPY (EGD) WITH PROPOFOL ;  Surgeon: Luke Salaam, MD;  Location: [facility] ENDOSCOPY;  Service: Gastroenterology;  Laterality: N/A;   ESOPHAGOGASTRODUODENOSCOPY (EGD) WITH PROPOFOL  N/A 07/27/2021   Procedure: ESOPHAGOGASTRODUODENOSCOPY (EGD) WITH PROPOFOL ;  Surgeon: Selena Daily, MD;  Location: [facility] ENDOSCOPY;  Service: Gastroenterology;  Laterality: N/A;   PACEMAKER IMPLANT      Social History:  reports that she has never smoked. She has never used smokeless tobacco. She reports that she does not drink alcohol and does not use drugs.  Family History:  Family History  Problem Relation Age of Onset   Heart disease Mother  Prior to Admission medications   Medication Sig Start Date End Date Taking? Authorizing Provider  acetaminophen  (TYLENOL ) 500 MG tablet Take 1,000 mg by mouth every 8 (eight) hours.    [provider]  alum & mag hydroxide-simeth (ALUMINA-MAGNESIA-SIMETHICONE ) 200-200-20 MG/5ML suspension Take 15 mLs by mouth every 6 (six) hours as needed for indigestion or heartburn.    [provider]  apixaban  (ELIQUIS ) 2.5 MG TABS tablet Take 2.5 mg by mouth 2 (two) times daily.    [provider]  benzonatate (TESSALON) 100 MG capsule Take 100 mg by mouth 3 (three) times daily as needed for cough.    [provider]  bisacodyl  (DULCOLAX) 10 MG suppository Place 1 suppository (10 mg total) rectally daily as needed for mild constipation. 11/08/20   Tiajuana Fluke, MD  carvedilol  (COREG ) 25 MG tablet Take 25 mg by mouth 2 (two) times daily with a meal.    [provider]  cloNIDine  (CATAPRES  - DOSED IN MG/24 HR) 0.3 mg/24hr patch Place 0.3 mg onto the skin every Friday.    [provider]  cyanocobalamin (VITAMIN B12) 1000 MCG tablet Take 1,000 mcg by mouth daily.    [provider]  cycloSPORINE  (RESTASIS ) 0.05 % ophthalmic emulsion Place 1 drop into both eyes 2 (two) times daily. 05/26/21   [provider]  divalproex  (DEPAKOTE  SPRINKLE) 125 MG capsule Take 125 mg by mouth 2 (two) times daily. 02/18/21   [provider]  ferrous sulfate  325 (65 FE) MG tablet Take 325 mg by mouth every other day.    [provider]  fluticasone  (FLONASE ) 50 MCG/ACT nasal spray Place 1 spray into both nostrils daily.    [provider]  hydrALAZINE  (APRESOLINE ) 50 MG tablet Take 1 tablet (50 mg total) by mouth every 8 (eight) hours. 10/28/21   Verla Glaze, MD  iron  polysaccharides (NIFEREX) 150 MG capsule Take 150 mg by mouth daily. Patient not taking: Reported on 06/29/2022    [provider]  levocetirizine (XYZAL) 5 MG tablet Take 5 mg by mouth daily. 06/11/21   [provider]  losartan  (COZAAR ) 100 MG tablet Take 100 mg by mouth daily.    [provider]  melatonin 5 MG TABS Take 10 mg by mouth at bedtime.    [provider]  Multiple Vitamins-Minerals (MULTIVITAMIN WITH MINERALS) tablet Take 1 tablet by mouth daily.    [provider]  naloxone [facility]) nasal spray  4 mg/0.1 mL Place 1 spray into the nose once.    [provider]  OLANZapine  (ZYPREXA ) 2.5 MG tablet Take 2.5 mg by mouth at bedtime.    [provider]  olopatadine  (PATANOL) 0.1 % ophthalmic solution Place 1 drop into both eyes 2 (two) times daily.    [provider]  omeprazole  (PRILOSEC) 20 MG capsule Take 20 mg by mouth daily.    [provider]  ondansetron  (ZOFRAN -ODT) 4 MG disintegrating tablet Take 4 mg by mouth every 8 (eight) hours as needed for vomiting or nausea.    [provider]  polyethylene glycol (MIRALAX  / GLYCOLAX ) 17 g packet Take 17 g by mouth 2 (two) times daily. 11/16/20   Leona Rake, MD  prazosin  (MINIPRESS ) 1 MG capsule Take 1 mg by mouth at bedtime.    [provider]  senna-docusate (SENOKOT-S) 8.6-50 MG tablet Take 1 tablet by mouth 2 (two) times daily. 11/08/20   Tiajuana Fluke, MD  simethicone  (MYLICON) 80 MG chewable tablet Cassie Click  80 mg by mouth 3 (three) times daily as needed for flatulence.    [provider]  traMADol  (ULTRAM ) 50 MG tablet Take 50 mg by mouth every 6 (six) hours as needed.    [provider]  venlafaxine  XR (EFFEXOR -XR) 75 MG 24 hr capsule Take 75 mg by mouth daily. 06/20/21   [provider]    Physical Exam: Vitals:   08/27/23 1711 08/27/23 1713 08/27/23 1900  BP:  (!) 166/60 (!) 152/55  Pulse:  66 69  Resp:  19 19  Temp:  98 F (36.7 C)   TempSrc:  Axillary   SpO2:  92% 93%  Weight: 95.8 kg    Height: 5' 4 (1.626 m)     General: Not in acute distress HEENT:       Eyes: PERRL, EOMI, no jaundice       ENT: No discharge from the ears and nose       Neck: No JVD, no bruit, no mass felt. Heme: No neck lymph node enlargement. Cardiac: S1/S2, RRR, No murmurs, No gallops or rubs. Respiratory: Has fine crackles bilaterally GI: Soft, nondistended, nontender, no organomegaly, BS present. GU: No hematuria Ext: 1+ pitting leg edema bilaterally. 1+DP/PT  pulse bilaterally. Musculoskeletal: No joint deformities, No joint redness or warmth, no limitation of ROM in spin. Skin: No rashes.  Neuro: Not arousable, not following command, not orientated x 3, slightly moves extremities on painful stimuli Psych: Patient is not psychotic  Labs on Admission: I have personally reviewed following labs and imaging studies  CBC: Recent Labs  Lab 08/27/23 1722  WBC 8.9  HGB 9.5*  HCT 31.3*  MCV 82.4  PLT 401*   Basic Metabolic Panel: Recent Labs  Lab 08/27/23 1722  NA 131*  K 5.4*  CL 98  CO2 25  GLUCOSE 100*  BUN 26*  CREATININE 1.02*  CALCIUM  9.5   GFR: Estimated Creatinine Clearance (by C-G formula based on SCr of 1.02 mg/dL (H)) Female: 81.1 mL/min (A) Female: 54.3 mL/min (A) Liver Function Tests: Recent Labs  Lab 08/27/23 1722  AST 15  ALT 12  ALKPHOS 103  BILITOT 0.6  PROT 7.3  ALBUMIN 2.6*   No results for input(s): LIPASE, AMYLASE in the last 168 hours. Recent Labs  Lab 08/27/23 1815  AMMONIA 29   Coagulation Profile: No results for input(s): INR, PROTIME in the last 168 hours. Cardiac Enzymes: No results for input(s): CKTOTAL, CKMB, CKMBINDEX, TROPONINI in the last 168 hours. BNP (last 3 results) No results for input(s): PROBNP in the last 8760 hours. HbA1C: No results for input(s): HGBA1C in the last 72 hours. CBG: No results for input(s): GLUCAP in the last 168 hours. Lipid Profile: No results for input(s): CHOL, HDL, LDLCALC, TRIG, CHOLHDL, LDLDIRECT in the last 72 hours. Thyroid  Function Tests: No results for input(s): TSH, T4TOTAL, FREET4, T3FREE, THYROIDAB in the last 72 hours. Anemia Panel: No results for input(s): VITAMINB12, FOLATE, FERRITIN, TIBC, IRON , RETICCTPCT in the last 72 hours. Urine analysis:    Component Value Date/Time   COLORURINE YELLOW (A) 06/29/2022 0556   APPEARANCEUR HAZY (A) 06/29/2022 0556   LABSPEC 1.013 06/29/2022 0556    PHURINE 5.0 06/29/2022 0556   GLUCOSEU NEGATIVE 06/29/2022 0556   HGBUR NEGATIVE 06/29/2022 0556   BILIRUBINUR NEGATIVE 06/29/2022 0556   KETONESUR NEGATIVE 06/29/2022 0556   PROTEINUR NEGATIVE 06/29/2022 0556   NITRITE NEGATIVE 06/29/2022 0556   LEUKOCYTESUR LARGE (A) 06/29/2022 0556   Sepsis Labs: @LABRCNTIP (procalcitonin:4,lacticidven:4) ) Recent Results (  from the past 240 hours)  Resp panel by RT-PCR (RSV, Flu A&B, Covid) Anterior Nasal Swab     Status: None   Collection Time: 08/27/23  7:17 PM   Specimen: Anterior Nasal Swab  Result Value Ref Range Status   SARS Coronavirus 2 by RT PCR NEGATIVE NEGATIVE Final    Comment: (NOTE) SARS-CoV-2 target nucleic acids are NOT DETECTED.  The SARS-CoV-2 RNA is generally detectable in upper respiratory specimens during the acute phase of infection. The lowest concentration of SARS-CoV-2 viral copies this assay can detect is 138 copies/mL. A negative result does not preclude SARS-Cov-2 infection and should not be used as the sole basis for treatment or other patient management decisions. A negative result may occur with  improper specimen collection/handling, submission of specimen other than nasopharyngeal swab, presence of viral mutation(s) within the areas targeted by this assay, and inadequate number of viral copies(<138 copies/mL). A negative result must be combined with clinical observations, patient history, and epidemiological information. The expected result is Negative.  Fact Sheet for Patients:  BloggerCourse.com  Fact Sheet for Healthcare Providers:  SeriousBroker.it  This test is no t yet approved or cleared by the [place]  FDA and  has been authorized for detection and/or diagnosis of SARS-CoV-2 by FDA under an Emergency Use Authorization (EUA). This EUA will remain  in effect (meaning this test can be used) for the duration of the COVID-19 declaration under  Section 564(b)(1) of the Act, 21 U.S.C.section 360bbb-3(b)(1), unless the authorization is terminated  or revoked sooner.       Influenza A by PCR NEGATIVE NEGATIVE Final   Influenza B by PCR NEGATIVE NEGATIVE Final    Comment: (NOTE) The Xpert Xpress SARS-CoV-2/FLU/RSV plus assay is intended as an aid in the diagnosis of influenza from Nasopharyngeal swab specimens and should not be used as a sole basis for treatment. Nasal washings and aspirates are unacceptable for Xpert Xpress SARS-CoV-2/FLU/RSV testing.  Fact Sheet for Patients: BloggerCourse.com  Fact Sheet for Healthcare Providers: SeriousBroker.it  This test is not yet approved or cleared by the [place]  FDA and has been authorized for detection and/or diagnosis of SARS-CoV-2 by FDA under an Emergency Use Authorization (EUA). This EUA will remain in effect (meaning this test can be used) for the duration of the COVID-19 declaration under Section 564(b)(1) of the Act, 21 U.S.C. section 360bbb-3(b)(1), unless the authorization is terminated or revoked.     Resp Syncytial Virus by PCR NEGATIVE NEGATIVE Final    Comment: (NOTE) Fact Sheet for Patients: BloggerCourse.com  Fact Sheet for Healthcare Providers: SeriousBroker.it  This test is not yet approved or cleared by the [place]  FDA and has been authorized for detection and/or diagnosis of SARS-CoV-2 by FDA under an Emergency Use Authorization (EUA). This EUA will remain in effect (meaning this test can be used) for the duration of the COVID-19 declaration under Section 564(b)(1) of the Act, 21 U.S.C. section 360bbb-3(b)(1), unless the authorization is terminated or revoked.  Performed at [facility], [address], [place], [place] [address]      Radiological Exams on Admission:   Assessment/Plan Principal Problem:   Aspiration  pneumonia [facility]) Active Problems:   Acute metabolic encephalopathy   CAD (coronary artery disease)   Hyponatremia   Bilateral leg edema   Atrial fibrillation, chronic (HCC)   Essential hypertension   Hyperkalemia   Iron  deficiency anemia   Chronic kidney disease, stage 3a (HCC)   Cirrhosis (HCC)   Bipolar affective disorder, manic,  severe (HCC)   Assessment and Plan:  Aspiration pneumonia (HCC): pt has cough. CXR showed right greater than left lower lung airspace disease, indicating possible aspiration pneumonia.  No fever or leukocytosis, clinically not septic.  - Will admit to tele bed as inpt - IV unasyn - Incentive spirometry - Mucinex  for cough  - Bronchodilators - Urine S. pneumococcal antigen - Follow up blood culture x2, sputum culture - Check RSEP panel - Deep suction prn - IV fluid: 500 cc normal saline, then 75 cc/h  Acute metabolic encephalopathy: Etiology is not clear.  CT head negative for acute intracranial abnormalities.  Patient has pacemaker, cannot due to MRI of the brain.  Possibly due to multifactorial etiology, including electrolytes disturbance (hyponatremia hyper kalemia), aspiration pneumonia. - Hold all oral medications until mental status improves - Frequent neurocheck - Fall precaution - Pending UA to r/o UTI.  CAD (coronary artery disease): Does not seem to have chest pain.  Troponin 10 in ED. -Observe closely  Hyponatremia: Sodium 131 - IV normal saline: - Sodium chloride  tablet 1 g twice daily when able to take  Bilateral leg edema: Patient has 1+ bilateral leg edema, chest x-ray showed vascular congestion, BNP is elevated 520, indicating possible CHF.  2D echo on 04/19/2021 showed EF 60 to 65%, likely has diastolic CHF.  No oxygen desaturation does not seem to have acute exacerbation. - Started Lasix  40 mg twice daily - Follow-up 2D echo  Atrial fibrillation, chronic (HCC): Heart rate 69 -pt is on Eliquis  and Coreg  -prn IV metoprolol 2.5  mg every 2 hours for heart rate > 125  Essential hypertension: -Patient is on Coreg , clonidine  patch, oral hydralazine , Cozaar  - IV hydralazine  as needed - may need to d/c Cozaar  due to hypokalemia  Hyperkalemia: Mild hyperkalemia with potassium 5.4 - Patient is on IV fluid and IV Lasix  as above, expecting correction  Iron  deficiency anemia -Patient is on iron  supplement  Chronic kidney disease, stage 3a [facility]): Renal function stable, recent baseline creatinine 1.06 on 06/29/2022.  Her creatinine is 1.02, BUN 26, GFR 54. - Follow-up with BMP  Cirrhosis (HCC): Ammonia level 29. -Check INR/PTT  Bipolar affective disorder, manic, severe (HCC): Has subtherapeutic Depakote  level 20. - Patient is on olanzapine , Effexor , Depakote  - Give 1 dose IV Depacon 500 mg now      DVT ppx: SCD ( on Eliqius at home)  Code Status: DNR per her daughter   Family Communication:     Yes, patient's daughter at bed side.      Disposition Plan:  Anticipate discharge back to previous environment, SNF  Consults called:  none  Admission status and Level of care: Telemetry Medical : as inpt        Dispo: The patient is from: SNF              Anticipated d/c is to: SNF              Anticipated d/c date is: 2 days              Patient currently is not medically stable to d/c.    Severity of Illness:  The appropriate patient status for this patient is INPATIENT. Inpatient status is judged to be reasonable and necessary in order to provide the required intensity of service to ensure the patient's safety. The patient's presenting symptoms, physical exam findings, and initial radiographic and laboratory data in the context of their chronic comorbidities is felt to place them at high risk  for further clinical deterioration. Furthermore, it is not anticipated that the patient will be medically stable for discharge from the hospital within 2 midnights of admission.   * I certify that at the point of  admission it is my clinical judgment that the patient will require inpatient hospital care spanning beyond 2 midnights from the point of admission due to high intensity of service, high risk for further deterioration and high frequency of surveillance required.*       Date of Service 08/27/2023    Fidencio Hue [facility]   If 7PM-7AM, please contact night-coverage [email or web address] 08/27/2023, 9:51 PM

## 2023-08-27 NOTE — ED Notes (Signed)
 This RN spoke with RN from [facility] about pt status update.

## 2023-08-27 NOTE — ED Provider Notes (Signed)
 [facility] Provider Note    Event Date/Time   First MD Initiated Contact with Patient 08/27/23 1719     (approximate)   History   Altered Mental Status   HPI  Katrina Chen is a 88 y.o. adult with history of dementia, A-fib on Eliquis , CKD, CAD, bipolar disorder presenting with altered mental status.  Per daughter patient has been altered for the last 3 days.  Thinks that these are similar symptoms to when she was hyponatremic in the past requiring admission.  No reported trauma or falls.  No fevers or nausea or vomiting.  No reported cough.  She denies history of seizures.  Independent history obtained from daughter as above.  Is at baseline patient is typically interactive and conversant.  Also reports that patient has been dehydrated and has decreased p.o. intake for last several days.  On independent review, she was admitted in 2024 for generalized weakness, was found to have pneumonia and started on IV antibiotics, also found to be hyperkalemic, hyponatremic.  Also with history of cirrhosis.     Physical Exam   Triage Vital Signs: ED Triage Vitals  Encounter Vitals Group     BP 08/27/23 1713 (!) 166/60     Girls Systolic BP Percentile --      Girls Diastolic BP Percentile --      Boys Systolic BP Percentile --      Boys Diastolic BP Percentile --      Pulse Rate 08/27/23 1713 66     Resp 08/27/23 1713 19     Temp 08/27/23 1713 98 F (36.7 C)     Temp Source 08/27/23 1713 Axillary     SpO2 08/27/23 1713 92 %     Weight 08/27/23 1711 211 lb 3.2 oz (95.8 kg)     Height --      Head Circumference --      Peak Flow --      Pain Score 08/27/23 1711 0     Pain Loc --      Pain Education --      Exclude from Growth Chart --     Most recent vital signs: Vitals:   08/27/23 1713 08/27/23 1900  BP: (!) 166/60 (!) 152/55  Pulse: 66 69  Resp: 19 19  Temp: 98 F (36.7 C)   SpO2: 92% 93%     General: Awake, not following commands CV:  Good  peripheral perfusion.  Resp:  Normal effort.  Clear Abd:  No distention.  Soft nontender Other:  Bilateral lower extremity edema that appears symmetrical, she does fight to keep her eyes closed when I try to open them, pupils are equal and reactive, no obvious facial asymmetry, she is moving all 4 extremities without focal weakness.   ED Results / Procedures / Treatments   Labs (all labs ordered are listed, but only abnormal results are displayed) Labs Reviewed  CBC - Abnormal; Notable for the following components:      Result Value   RBC 3.80 (*)    Hemoglobin 9.5 (*)    HCT 31.3 (*)    MCH 25.0 (*)    Platelets 401 (*)    All other components within normal limits  COMPREHENSIVE METABOLIC PANEL WITH GFR - Abnormal; Notable for the following components:   Sodium 131 (*)    Potassium 5.4 (*)    Glucose, Bld 100 (*)    BUN 26 (*)    Creatinine, Ser 1.02 (*)  Albumin 2.6 (*)    GFR, Estimated 54 (*)    All other components within normal limits  VALPROIC ACID  LEVEL - Abnormal; Notable for the following components:   Valproic Acid  Lvl 20 (*)    All other components within normal limits  RESP PANEL BY RT-PCR (RSV, FLU A&B, COVID)  RVPGX2  AMMONIA  URINALYSIS, W/ REFLEX TO CULTURE (INFECTION SUSPECTED)  BRAIN NATRIURETIC PEPTIDE  LACTIC ACID, PLASMA  TROPONIN I (HIGH SENSITIVITY)     EKG  EKG shows, atrial fibrillation, normal QRS, normal QTc, multiple PVCs, incomplete left branch block, no ischemic ST elevation, T wave flattening in 1, aVL, this is changed compared to prior since prior EKG shows V paced rhythm   RADIOLOGY On my independent interpretation, chest x-ray shows right-sided opacities   PROCEDURES:  Critical Care performed: No  Ultrasound ED Echo  Date/Time: 08/27/2023 7:00 PM  Performed by: Shane Darling, MD Authorized by: Shane Darling, MD   Procedure details:    Indications comment:  AMS   Views: IVC view     Images: not archived   Findings:     IVC: collapsed   Impression:    Impression comment:  Fluid tolerant IVC Ultrasound ED Thoracic  Date/Time: 08/27/2023 7:01 PM  Performed by: Shane Darling, MD Authorized by: Shane Darling, MD   Procedure details:    Indications comment:  AMS   Left lung pleural:  Visualized   Right lung pleural:  Visualized   Images: not archived   Findings:    A-lines noted throughout: identified     B-lines noted throughout: not identified   Right Lung Findings:     right lung pleural effusion      Comments:     R pleural effusion noted, no obvious B-lines    MEDICATIONS ORDERED IN ED: Medications  0.9 %  sodium chloride  infusion (has no administration in time range)  albuterol  (PROVENTIL ) (2.5 MG/3ML) 0.083% nebulizer solution 2.5 mg (has no administration in time range)  dextromethorphan -guaiFENesin  (MUCINEX  DM) 30-600 MG per 12 hr tablet 1 tablet (has no administration in time range)  sodium chloride  0.9 % bolus 500 mL (500 mLs Intravenous New Bag/Given 08/27/23 1852)     IMPRESSION / MDM / ASSESSMENT AND PLAN / ED COURSE  I reviewed the triage vital signs and the nursing notes.                              Differential diagnosis includes, but is not limited to, x-ray derangements, dehydration, occult infection, hyperammonemia, atypical ACS, intracranial hemorrhage.  Will get labs, EKG, troponin, chest x-ray, CT head, UA, ammonia level.  Will give her some fluids here.  Patient's presentation is most consistent with acute presentation with potential threat to life or bodily function.  Independent interpretation of labs and imaging below.  Chest x-ray noted mild CHF with right and left pleural effusion as well as right greater than left opacities.  This ultrasound shows collapsing IVC, she is dry mucous membranes on exam, no B-lines anteriorly, suspect this might be related to pneumonia instead of volume overload.  She is getting IV fluids at this time, will start her on IV ceftriaxone  as  well as doxycycline  for her pneumonia.  Labs were notable for potassium of 5.4, no new EKG changes, she is getting fluids.  No indication for calcium , bicarb, Lasix  at this time.  She will need to be admitted for  further management of her altered mental status as well as pneumonia.  Consult to hospitalist was agreeable with the plan for admission and will evaluate the patient.  She is admitted.  The patient is on the cardiac monitor to evaluate for evidence of arrhythmia and/or significant heart rate changes.   Clinical Course as of 08/27/23 1937  Mon Aug 27, 2023  1848 DG Chest 1 View IMPRESSION: Mild congestive heart failure.  Small right and probable small left pleural effusion. Right greater than left lower lung airspace disease could represent atelectasis or concurrent infection.  Aortic Atherosclerosis (ICD10-I70.0).  Mild limitations as detailed above.   [TT]  1901 CT Head Wo Contrast IMPRESSION: 1. No acute intracranial findings. 2. Atrophy and white matter microvascular disease.   [TT]  1901 Independent review of labs, ammonia is not elevated, troponin is normal, no leukocytosis, mild hyponatremia, hyperkalemia, creatinine is stable compared to prior, LFTs are normal.  She is getting IV fluids given the dry mucous brains as well as fluid tolerate IVC seen on ultrasound. [TT]    Clinical Course User Index [TT] Drenda Gentle Richard Champion, MD     FINAL CLINICAL IMPRESSION(S) / ED DIAGNOSES   Final diagnoses:  Altered mental status, unspecified altered mental status type  Dehydration  Pneumonia due to infectious organism, unspecified laterality, unspecified part of lung  Hyperkalemia     Rx / DC Orders   ED Discharge Orders     None        Note:  This document was prepared using Dragon voice recognition software and may include unintentional dictation errors.    Shane Darling, MD 08/27/23 [phone]

## 2023-08-27 NOTE — ED Notes (Signed)
Lab called to come draw second set of blood cultures

## 2023-08-27 NOTE — ED Notes (Signed)
 Floor coverage provider updated about pt's rectal temp and notified that this RN placed bear hugger on pt.

## 2023-08-27 NOTE — ED Triage Notes (Addendum)
 Pt arrives via EMS from [facility]. Per [facility] sent pt out for possible hyponatremia. EMS sts that pt has AMS also and sticks her tongue out and starts making a noise. [facility] from [facility] that pt normally talks to staff in complete sentences however pt is not oriented at baseline.

## 2023-08-28 ENCOUNTER — Inpatient Hospital Stay
Admit: 2023-08-28 | Discharge: 2023-08-28 | Disposition: A | Discharge status: Home / Self Care | Attending: Internal Medicine | Admitting: Internal Medicine

## 2023-08-28 DIAGNOSIS — R600 Localized edema: Secondary | ICD-10-CM

## 2023-08-28 DIAGNOSIS — G9341 Metabolic encephalopathy: Secondary | ICD-10-CM | POA: Diagnosis not present

## 2023-08-28 DIAGNOSIS — J690 Pneumonitis due to inhalation of food and vomit: Secondary | ICD-10-CM | POA: Diagnosis not present

## 2023-08-28 DIAGNOSIS — I2583 Coronary atherosclerosis due to lipid rich plaque: Secondary | ICD-10-CM

## 2023-08-28 DIAGNOSIS — I2510 Atherosclerotic heart disease of native coronary artery without angina pectoris: Secondary | ICD-10-CM

## 2023-08-28 LAB — ECHOCARDIOGRAM COMPLETE
Height: 64 in
S' Lateral: 2.9 cm
Weight: 3379.2 [oz_av]

## 2023-08-28 LAB — BASIC METABOLIC PANEL WITH GFR
Anion gap: 9 (ref 5–15)
BUN: 26 mg/dL — ABNORMAL HIGH (ref 8–23)
CO2: 27 mmol/L (ref 22–32)
Calcium: 9.1 mg/dL (ref 8.9–10.3)
Chloride: 99 mmol/L (ref 98–111)
Creatinine, Ser: 1 mg/dL (ref 0.44–1.00)
GFR, Estimated: 55 mL/min — ABNORMAL LOW (ref >60)
Glucose, Bld: 63 mg/dL — ABNORMAL LOW (ref 70–99)
Potassium: 4.9 mmol/L (ref 3.5–5.1)
Sodium: 135 mmol/L (ref 135–145)

## 2023-08-28 LAB — CBC
HCT: 29.3 % — ABNORMAL LOW (ref 36.0–46.0)
Hemoglobin: 8.7 g/dL — ABNORMAL LOW (ref 12.0–15.0)
MCH: 24.6 pg — ABNORMAL LOW (ref 26.0–34.0)
MCHC: 29.7 g/dL — ABNORMAL LOW (ref 30.0–36.0)
MCV: 82.8 fL (ref 80.0–100.0)
Platelets: 373 10*3/uL (ref 150–400)
RBC: 3.54 MIL/uL — ABNORMAL LOW (ref 3.87–5.11)
RDW: 15.4 % (ref 11.5–15.5)
WBC: 8.2 10*3/uL (ref 4.0–10.5)
nRBC: 0 % (ref 0.0–0.2)

## 2023-08-28 LAB — STREP PNEUMONIAE URINARY ANTIGEN: Strep Pneumo Urinary Antigen: NEGATIVE

## 2023-08-28 MED ORDER — LORATADINE 10 MG PO TABS
10.0000 mg | ORAL_TABLET | Freq: Every day | ORAL | Status: DC
  Administered 2023-08-29 – 2023-08-31 (×3): 10 mg via ORAL
  Filled 2023-08-28 (×3): qty 1

## 2023-08-28 MED ORDER — LOSARTAN POTASSIUM 50 MG PO TABS
100.0000 mg | ORAL_TABLET | Freq: Every day | ORAL | Status: DC
  Administered 2023-08-28 – 2023-08-31 (×4): 100 mg via ORAL
  Filled 2023-08-28 (×4): qty 2

## 2023-08-28 MED ORDER — OLANZAPINE 5 MG PO TABS
5.0000 mg | ORAL_TABLET | Freq: Every day | ORAL | Status: DC
  Administered 2023-08-28 – 2023-08-30 (×3): 5 mg via ORAL
  Filled 2023-08-28 (×3): qty 1

## 2023-08-28 MED ORDER — POLYVINYL ALCOHOL 1.4 % OP SOLN: 1.0000 [drp] | OPHTHALMIC | Status: DC | PRN

## 2023-08-28 MED ORDER — FE FUM-VIT C-VIT B12-FA 460-60-0.01-1 MG PO CAPS
1.0000 | ORAL_CAPSULE | Freq: Two times a day (BID) | ORAL | Status: DC
  Administered 2023-08-28 – 2023-08-31 (×4): 1 via ORAL
  Filled 2023-08-28 (×6): qty 1

## 2023-08-28 MED ORDER — LEVOCETIRIZINE DIHYDROCHLORIDE 5 MG PO TABS: 5.0000 mg | ORAL_TABLET | Freq: Every day | ORAL | Status: DC

## 2023-08-28 MED ORDER — OLOPATADINE HCL 0.1 % OP SOLN
1.0000 [drp] | Freq: Two times a day (BID) | OPHTHALMIC | Status: DC
  Administered 2023-08-28 – 2023-08-31 (×6): 1 [drp] via OPHTHALMIC
  Filled 2023-08-28: qty 5

## 2023-08-28 MED ORDER — CLONIDINE HCL 0.3 MG/24HR TD PTWK
0.3000 mg | MEDICATED_PATCH | TRANSDERMAL | Status: DC
  Administered 2023-08-31: 0.3 mg via TRANSDERMAL
  Filled 2023-08-28: qty 1

## 2023-08-28 MED ORDER — CARVEDILOL 25 MG PO TABS
25.0000 mg | ORAL_TABLET | Freq: Two times a day (BID) | ORAL | Status: DC
  Administered 2023-08-28 – 2023-08-30 (×5): 25 mg via ORAL
  Filled 2023-08-28 (×5): qty 1

## 2023-08-28 MED ORDER — POLYETHYLENE GLYCOL 3350 17 G PO PACK
17.0000 g | PACK | Freq: Two times a day (BID) | ORAL | Status: DC
  Administered 2023-08-29 – 2023-08-31 (×5): 17 g via ORAL
  Filled 2023-08-28 (×5): qty 1

## 2023-08-28 MED ORDER — CARBOXYMETHYLCELLULOSE SODIUM 1 % OP SOLN: 1.0000 [drp] | Freq: Two times a day (BID) | OPHTHALMIC | Status: DC

## 2023-08-28 MED ORDER — DIVALPROEX SODIUM 125 MG PO CSDR
125.0000 mg | DELAYED_RELEASE_CAPSULE | Freq: Two times a day (BID) | ORAL | Status: DC
  Administered 2023-08-28 – 2023-08-31 (×6): 125 mg via ORAL
  Filled 2023-08-28 (×6): qty 1

## 2023-08-28 MED ORDER — FLUTICASONE PROPIONATE 50 MCG/ACT NA SUSP
1.0000 | Freq: Every day | NASAL | Status: DC
  Administered 2023-08-29 – 2023-08-31 (×3): 1 via NASAL
  Filled 2023-08-28: qty 16

## 2023-08-28 MED ORDER — VENLAFAXINE HCL ER 75 MG PO CP24
75.0000 mg | ORAL_CAPSULE | Freq: Every day | ORAL | Status: DC
  Administered 2023-08-28 – 2023-08-31 (×4): 75 mg via ORAL
  Filled 2023-08-28 (×4): qty 1

## 2023-08-28 MED ORDER — PANTOPRAZOLE SODIUM 40 MG PO TBEC
40.0000 mg | DELAYED_RELEASE_TABLET | Freq: Every day | ORAL | Status: DC
  Administered 2023-08-28 – 2023-08-31 (×4): 40 mg via ORAL
  Filled 2023-08-28 (×4): qty 1

## 2023-08-28 NOTE — Progress Notes (Signed)
 Progress Note   Patient: Katrina Chen ZOX:[id number] DOB: 21-Jan-1936 DOA: 08/27/2023     1 DOS: the patient was seen and examined on 08/28/2023   Brief hospital course: Taken from H&P.  Beila Purdie is a 88 y.o. adult with medical history significant of bedbound, A-fib on Eliquis , s/p of PPM, HTN, HLD, CAD, dementia, bipolar, depression, CKD-3, anemia, liver cirrhosis, lumbar spinal stenosis with chronic back pain, who presents with altered mental status and cough.   At baseline patient is bedbound but alert and oriented x 3.  Over the past 3 days progressive decline with altered mental status, cough and unable to clear her secretions.  No respiratory distress.  Unsure about any UTI symptoms.  On presentation patient was not following any commands and quite obtunded. Vitals mostly stable.  Labs with no leukocytosis, BNP 520, potassium 5.4, sodium 131, troponin 10, subtherapeutic valproic acid  and ammonia level of 29. CT head was negative for any acute intracranial abnormalities. CXR with mild congestive heart failure, small right and probable small left pleural effusion.  Right greater than left lower lungs airspace disease could represent atelectasis or infection. EKG with A-fib, QTc 477, LAD, poor progression, anteroseptal infarction pattern.  Aspiration pneumonia.  Patient was started on Unasyn for concern of aspiration pneumonia and IV diuresis.  6/16: Vital stable, UA was not consistent with UTI.  Labs stable and strep pneumo negative.  Preliminary blood cultures negative in 12 hours.  Hyponatremia and hyperkalemia resolved.  Patient with significant dysarthria which is not her baseline per daughter-MRI ordered, hopefully can be done as patient has a pacemaker in place.  Assessment and Plan: * Aspiration pneumonia (HCC) Patient had cough and x-ray concerning for bilateral lower lobe airspace disease, possible aspiration pneumonia.  Although no fever or leukocytosis.  Strep pneumo negative,  preliminary blood culture negative - Continue with Unasyn -Continue with supportive care  Acute metabolic encephalopathy Currently unclear etiology.  Initial CT head negative for any acute abnormality.  Mild electrolyte disturbance which has been resolved. Per daughter significant dysarthria and she is unable to talk, which is not her baseline.  Difficult to assess any focal deficit as of overall appears weak and unable to follow appropriate commands. UA is not consistent with UTI -Will try getting MRI-Per daughter her pacemaker is compatible -Continue to monitor -Frequent neurochecks -Fall precautions  Bilateral leg edema No prior diagnosis of CHF.  1+ leg edema on admission which is now improving.  BNP elevated at 520.  Echocardiogram done in February 2023 with normal EF.  She was started on IV diuresis -Continue with IV Lasix  40 mg twice daily -Echocardiogram ordered-pending - Daily weight and BNP -Strict intake and output  Hyponatremia Resolved. -Continue salt tablets -Continue to monitor  CAD (coronary artery disease) No chest pain and troponin negative -Continuing home Coreg   Atrial fibrillation, chronic (HCC) Heart rate well-controlled. - Patient was not taking Eliquis  which was listed in her meds -Continue with Coreg   Essential hypertension Blood pressure mildly elevated. -Continue home Coreg , clonidine  patch and Cozaar  Can restart hydralazine  if needed  Hyperkalemia Resolved with IV fluid and Lasix  - Continue to monitor -If started trending up we can discontinue home Cozaar   Chronic kidney disease, stage 3a (HCC) Renal function stable. - Monitor renal function while she is being diuresed -Avoid nephrotoxins  Iron  deficiency anemia - Continue iron  supplement  Cirrhosis (HCC) Ammonia level 29.  INR 1.2 and no transaminitis. - Continue to monitor  Bipolar affective disorder, manic, severe (HCC) Patient had  subtherapeutic Depakote  levels at 20 on  admission -Continue home olanzapine , Effexor  and Depakote    Subjective: Patient was seen and examined today.  She tried to speak but having significant dysarthria.  Per daughter this is not her normal.  Overall generalized weakness.  Following some commands but not all of them.  Physical Exam: Vitals:   08/28/23 0730 08/28/23 0747 08/28/23 1030 08/28/23 1202  BP: (!) 109/37  (!) 138/50 (!) 152/55  Pulse: 60  (!) 59 61  Resp: 12  15 15   Temp:  (!) 97.4 F (36.3 C)  97.8 F (36.6 C)  TempSrc:  Oral    SpO2: 100%  99% 100%  Weight:      Height:       General.  Frail and obese elderly lady, in no acute distress. Pulmonary.  Lungs clear bilaterally, normal respiratory effort. CV.  Regular rate and rhythm, no JVD, rub or murmur. Abdomen.  Soft, nontender, nondistended, BS positive. CNS.  Awake but lethargic, following some of the commands but not all of them. Extremities.  Trace LE edema,  pulses intact and symmetrical.  Data Reviewed: Prior data reviewed  Family Communication: Discussed with daughter at bedside  Disposition: Status is: Inpatient Remains inpatient appropriate because: Severity of illness  Planned Discharge Destination: Skilled nursing facility  Time spent: 50 minutes  This record has been created using Conservation officer, historic buildings. Errors have been sought and corrected,but may not always be located. Such creation errors do not reflect on the standard of care.   Author: Luna Salinas, MD 08/28/2023 2:30 PM  For on call review [email or web address].

## 2023-08-28 NOTE — Assessment & Plan Note (Signed)
 Patient had subtherapeutic Depakote  levels at 20 on admission -Continue home olanzapine , Effexor  and Depakote 

## 2023-08-28 NOTE — ED Notes (Signed)
Informed RN bed assigned 

## 2023-08-28 NOTE — Assessment & Plan Note (Signed)
 Heart rate well-controlled. - Patient was not taking Eliquis  which was listed in her meds -Continue with Coreg 

## 2023-08-28 NOTE — ED Notes (Signed)
 Assisted pt with eating breakfast tray. Pt ate most of pureed sausage, some egg, and 1/4 of the pureed pineapple. Pt drank 1 cup of water and 1 cup of orange juice.

## 2023-08-28 NOTE — Assessment & Plan Note (Signed)
 No chest pain and troponin negative -Continuing home Coreg 

## 2023-08-28 NOTE — Assessment & Plan Note (Signed)
 Blood pressure mildly elevated. -Continue home Coreg , clonidine  patch and Cozaar  Can restart hydralazine  if needed

## 2023-08-28 NOTE — Assessment & Plan Note (Signed)
 Renal function stable. - Monitor renal function while she is being diuresed -Avoid nephrotoxins

## 2023-08-28 NOTE — Assessment & Plan Note (Signed)
 Resolved. -Continue salt tablets -Continue to monitor

## 2023-08-28 NOTE — Assessment & Plan Note (Signed)
 Patient had cough and x-ray concerning for bilateral lower lobe airspace disease, possible aspiration pneumonia.  Although no fever or leukocytosis.  Strep pneumo negative, preliminary blood culture negative - Continue with Unasyn -Continue with supportive care

## 2023-08-28 NOTE — Assessment & Plan Note (Signed)
 Resolved with IV fluid and Lasix  - Continue to monitor -If started trending up we can discontinue home Cozaar 

## 2023-08-28 NOTE — Assessment & Plan Note (Signed)
 Ammonia level 29.  INR 1.2 and no transaminitis. - Continue to monitor

## 2023-08-28 NOTE — Progress Notes (Signed)
*  PRELIMINARY RESULTS* Echocardiogram 2D Echocardiogram has been performed.  Katrina Chen 08/28/2023, 2:46 PM

## 2023-08-28 NOTE — Hospital Course (Addendum)
 Taken from H&P.  Katrina Chen is a 88 y.o. adult with medical history significant of bedbound, A-fib on Eliquis , s/p of PPM, HTN, HLD, CAD, dementia, bipolar, depression, CKD-3, anemia, liver cirrhosis, lumbar spinal stenosis with chronic back pain, who presents with altered mental status and cough.   At baseline patient is bedbound but alert and oriented x 3.  Over the past 3 days progressive decline with altered mental status, cough and unable to clear her secretions.  No respiratory distress.  Unsure about any UTI symptoms.  On presentation patient was not following any commands and quite obtunded. Vitals mostly stable.  Labs with no leukocytosis, BNP 520, potassium 5.4, sodium 131, troponin 10, subtherapeutic valproic  acid and ammonia level of 29. CT head was negative for any acute intracranial abnormalities. CXR with mild congestive heart failure, small right and probable small left pleural effusion.  Right greater than left lower lungs airspace disease could represent atelectasis or infection. EKG with A-fib, QTc 477, LAD, poor progression, anteroseptal infarction pattern.  Aspiration pneumonia.  Patient was started on Unasyn  for concern of aspiration pneumonia and IV diuresis.  6/16: Vital stable, UA was not consistent with UTI.  Labs stable and strep pneumo negative.  Preliminary blood cultures negative in 12 hours.  Hyponatremia and hyperkalemia resolved.  Patient with significant dysarthria which is not her baseline per daughter-MRI ordered, hopefully can be done as patient has a pacemaker in place.  6/17: Vitals and labs stable.  Echocardiogram was normal.  Decreased IV Lasix  to daily instead of twice daily.  6/18: Hemodynamically stable.  MRI brain with no acute intracranial abnormalities, mild chronic microvascular changes and remote infarcts in the central pons and left cerebellum.  Dysarthria improving.  6/19: Hemodynamically stable but unable to urinate overnight requiring In-N-Out  catheter.  Giving her a voiding trial with periodic bladder scan.  Holding further IV diuresis.  6/20: Remained hemodynamically stable.  Likely will need to continue 2 L of oxygen and her SNF can try weaning her off.  She received Unasyn  for concern of aspiration pneumonia during hospitalization and is being discharged on Augmentin  to complete the course.  Patient was able to void later on.  Mentation now at baseline.  She lives in a facility and basically bedbound.  She will continue with her current medications and need to have a close follow-up with her providers for further assistance.

## 2023-08-28 NOTE — Assessment & Plan Note (Signed)
 Currently unclear etiology.  Initial CT head negative for any acute abnormality.  Mild electrolyte disturbance which has been resolved. Per daughter significant dysarthria and she is unable to talk, which is not her baseline.  Difficult to assess any focal deficit as of overall appears weak and unable to follow appropriate commands. UA is not consistent with UTI -Will try getting MRI-Per daughter her pacemaker is compatible -Continue to monitor -Frequent neurochecks -Fall precautions

## 2023-08-28 NOTE — Assessment & Plan Note (Signed)
 No prior diagnosis of CHF.  1+ leg edema on admission which is now improving.  BNP elevated at 520.  Echocardiogram done in February 2023 with normal EF.  She was started on IV diuresis -Continue with IV Lasix  40 mg twice daily -Echocardiogram ordered-pending - Daily weight and BNP -Strict intake and output

## 2023-08-28 NOTE — Assessment & Plan Note (Signed)
 Continue iron supplement.

## 2023-08-29 ENCOUNTER — Inpatient Hospital Stay

## 2023-08-29 DIAGNOSIS — G9341 Metabolic encephalopathy: Secondary | ICD-10-CM | POA: Diagnosis not present

## 2023-08-29 DIAGNOSIS — R600 Localized edema: Secondary | ICD-10-CM | POA: Diagnosis not present

## 2023-08-29 DIAGNOSIS — J690 Pneumonitis due to inhalation of food and vomit: Secondary | ICD-10-CM | POA: Diagnosis not present

## 2023-08-29 DIAGNOSIS — I2510 Atherosclerotic heart disease of native coronary artery without angina pectoris: Secondary | ICD-10-CM | POA: Diagnosis not present

## 2023-08-29 LAB — CBC
HCT: 30.2 % — ABNORMAL LOW (ref 36.0–46.0)
Hemoglobin: 9 g/dL — ABNORMAL LOW (ref 12.0–15.0)
MCH: 24.3 pg — ABNORMAL LOW (ref 26.0–34.0)
MCHC: 29.8 g/dL — ABNORMAL LOW (ref 30.0–36.0)
MCV: 81.6 fL (ref 80.0–100.0)
Platelets: 337 10*3/uL (ref 150–400)
RBC: 3.7 MIL/uL — ABNORMAL LOW (ref 3.87–5.11)
RDW: 15.3 % (ref 11.5–15.5)
WBC: 9 10*3/uL (ref 4.0–10.5)
nRBC: 0 % (ref 0.0–0.2)

## 2023-08-29 LAB — BASIC METABOLIC PANEL WITH GFR
Anion gap: 11 (ref 5–15)
BUN: 28 mg/dL — ABNORMAL HIGH (ref 8–23)
CO2: 29 mmol/L (ref 22–32)
Calcium: 9.3 mg/dL (ref 8.9–10.3)
Chloride: 95 mmol/L — ABNORMAL LOW (ref 98–111)
Creatinine, Ser: 1.13 mg/dL — ABNORMAL HIGH (ref 0.44–1.00)
GFR, Estimated: 47 mL/min — ABNORMAL LOW (ref >60)
Glucose, Bld: 52 mg/dL — ABNORMAL LOW (ref 70–99)
Potassium: 4.8 mmol/L (ref 3.5–5.1)
Sodium: 135 mmol/L (ref 135–145)

## 2023-08-29 MED ORDER — FUROSEMIDE 10 MG/ML IJ SOLN
40.0000 mg | Freq: Every day | INTRAMUSCULAR | Status: DC
  Administered 2023-08-30: 40 mg via INTRAVENOUS
  Filled 2023-08-29: qty 4

## 2023-08-29 NOTE — Progress Notes (Signed)
  Device system confirmed ***, to be MRI conditional, with implant date > 6 weeks ago, and no evidence of abandoned or epicardial leads in review of most recent CXR  Device last cleared by EP Provider: ***  Lyle San, 08/28/2023)  Clearance is good through for 1 year as long as parameters remain stable at time of check. If pt undergoes a cardiac device procedure during that time, they should be re-cleared.   Tachy-therapies to be programmed off if applicable with device back to pre-MRI settings after completion of exam.  [employer] - Programming recommendation received through [employer] App/Tablet  Bufford Helms S, RT  08/29/2023 12:31 PM

## 2023-08-29 NOTE — Progress Notes (Signed)
 Progress Note   Patient: Katrina Chen:[id number] DOB: Aug 16, 1935 DOA: 08/27/2023     2 DOS: the patient was seen and examined on 08/29/2023   Brief hospital course: Taken from H&P.  Katrina Chen is a 88 y.o. adult with medical history significant of bedbound, A-fib on Eliquis , s/p of PPM, HTN, HLD, CAD, dementia, bipolar, depression, CKD-3, anemia, liver cirrhosis, lumbar spinal stenosis with chronic back pain, who presents with altered mental status and cough.   At baseline patient is bedbound but alert and oriented x 3.  Over the past 3 days progressive decline with altered mental status, cough and unable to clear her secretions.  No respiratory distress.  Unsure about any UTI symptoms.  On presentation patient was not following any commands and quite obtunded. Vitals mostly stable.  Labs with no leukocytosis, BNP 520, potassium 5.4, sodium 131, troponin 10, subtherapeutic valproic acid  and ammonia level of 29. CT head was negative for any acute intracranial abnormalities. CXR with mild congestive heart failure, small right and probable small left pleural effusion.  Right greater than left lower lungs airspace disease could represent atelectasis or infection. EKG with A-fib, QTc 477, LAD, poor progression, anteroseptal infarction pattern.  Aspiration pneumonia.  Patient was started on Unasyn for concern of aspiration pneumonia and IV diuresis.  6/16: Vital stable, UA was not consistent with UTI.  Labs stable and strep pneumo negative.  Preliminary blood cultures negative in 12 hours.  Hyponatremia and hyperkalemia resolved.  Patient with significant dysarthria which is not her baseline per daughter-MRI ordered, hopefully can be done as patient has a pacemaker in place.  6/17: Vitals and labs stable.  Echocardiogram was normal.  Decreased IV Lasix  to daily instead of twice daily.  6/18: Hemodynamically stable.  MRI brain with no acute intracranial abnormalities, mild chronic microvascular  changes and remote infarcts in the central pons and left cerebellum.  Dysarthria improving.  Assessment and Plan: * Aspiration pneumonia (HCC) Patient had cough and x-ray concerning for bilateral lower lobe airspace disease, possible aspiration pneumonia.  Although no fever or leukocytosis.  Strep pneumo negative, preliminary blood culture negative - Continue with Unasyn-will switch to Augmentin on discharge -Continue with supportive care  Acute metabolic encephalopathy Currently unclear etiology.  Initial CT head negative for any acute abnormality.  Mild electrolyte disturbance which has been resolved.  MRI brain was also negative for any acute abnormality. UA is not consistent with UTI Seems improving and getting closer to baseline. -Continue to monitor -Frequent neurochecks -Fall precautions  Bilateral leg edema No prior diagnosis of CHF.  1+ leg edema on admission which is now improving.  BNP elevated at 520.  Echocardiogram done in February 2023 with normal EF.  She was started on IV diuresis -Continue with IV Lasix  40 mg-dose decreased to daily -Echocardiogram -normal - Daily weight and BMP -Strict intake and output  Hyponatremia Resolved. -Continue salt tablets -Continue to monitor  CAD (coronary artery disease) No chest pain and troponin negative -Continuing home Coreg   Atrial fibrillation, chronic (HCC) Heart rate well-controlled. - Patient was not taking Eliquis  which was listed in her meds -Continue with Coreg   Essential hypertension Blood pressure mildly elevated. -Continue home Coreg , clonidine  patch and Cozaar  Can restart hydralazine  if needed  Hyperkalemia Resolved with IV fluid and Lasix  - Continue to monitor -If started trending up we can discontinue home Cozaar   Chronic kidney disease, stage 3a (HCC) Renal function stable. - Monitor renal function while she is being diuresed -Avoid nephrotoxins  Iron  deficiency  anemia - Continue iron   supplement  Cirrhosis (HCC) Ammonia level 29.  INR 1.2 and no transaminitis. - Continue to monitor  Bipolar affective disorder, manic, severe (HCC) Patient had subtherapeutic Depakote  levels at 20 on admission -Continue home olanzapine , Effexor  and Depakote    Subjective: Patient was seen and examined today.  Speech with significant improvement , overall more alert.  Per daughter getting closer to baseline.  No new concern.  Physical Exam: Vitals:   08/28/23 2103 08/29/23 0345 08/29/23 0905 08/29/23 1314  BP: (!) 134/47 (!) 146/52 (!) 133/95 (!) 155/77  Pulse: (!) 59 60 61 60  Resp: 16 16 16 17   Temp: (!) 96.4 F (35.8 C) (!) 97.3 F (36.3 C) 97.6 F (36.4 C) (!) 97.5 F (36.4 C)  TempSrc:    Oral  SpO2: 100% 91% 92% 100%  Weight:      Height:       General.  Obese and frail elderly lady, in no acute distress. Pulmonary.  Lungs clear bilaterally, normal respiratory effort. CV.  Regular rate and rhythm, no JVD, rub or murmur. Abdomen.  Soft, nontender, nondistended, BS positive. CNS.  Alert and oriented to self.  No focal neurologic deficit. Extremities.  No edema, no cyanosis, pulses intact and symmetrical.  Data Reviewed: Prior data reviewed  Family Communication: Discussed with daughter at bedside  Disposition: Status is: Inpatient Remains inpatient appropriate because: Severity of illness  Planned Discharge Destination: Skilled nursing facility  Time spent: 50 minutes  This record has been created using Conservation officer, historic buildings. Errors have been sought and corrected,but may not always be located. Such creation errors do not reflect on the standard of care.   Author: Luna Salinas, MD 08/29/2023 2:48 PM  For on call review [email or web address].

## 2023-08-29 NOTE — NC FL2 (Signed)
 [place]  MEDICAID FL2 LEVEL OF CARE FORM     IDENTIFICATION  Patient Name: Katrina Chen Birthdate: 1935/06/13 Sex: adult Admission Date (Current Location): 08/27/2023  [facility] and [place] Number:  Chiropodist and Address:  [facility], [address] [place], [place] [address]      Provider Number: [id number]  Attending Physician Name and Address:  Luna Salinas, MD  Relative Name and Phone Number:  Daughter: Marilyn Shropshire, [phone]    Current Level of Care: Hospital Recommended Level of Care: Nursing Facility Prior Approval Number:    Date Approved/Denied:   PASRR Number: [id number]  Discharge Plan: SNF    Current Diagnoses: Patient Active Problem List   Diagnosis Date Noted   Aspiration pneumonia (HCC) 08/27/2023   Chronic kidney disease, stage 3a (HCC) 08/27/2023   Bilateral leg edema 08/27/2023   Multifocal pneumonia 06/28/2022   Acute respiratory failure with hypoxia (HCC) 06/27/2022   Electrolyte abnormality 06/27/2022   Degeneration of lumbar intervertebral disc 11/24/2021   Iron  deficiency anemia 10/27/2021   Acute metabolic encephalopathy 10/26/2021   Vascular dementia with behavioral disturbance (HCC) 10/26/2021   Obesity (BMI 30-39.9) 10/26/2021   Cirrhosis (HCC) 10/26/2021   Abnormal gait 10/25/2021   Acquired deformity of toe 10/25/2021   Closed fracture of distal fibula    Dementia (HCC) 10/09/2021   Fall 10/09/2021   Gastric atrophy    Gastric polyps    Acute UTI 06/16/2021   Generalized weakness 06/16/2021   Pleural effusion w/ enlarged cardiac silhouette on CXR, trace edema 06/16/2021   Spinal stenosis of lumbar region 11/12/2020   Hyponatremia 11/12/2020   Nonspecific chest pain 11/05/2020   CAD (coronary artery disease) 11/05/2020   Hyperkalemia 11/05/2020   Obstipation 11/05/2020   History of bipolar disorder 11/05/2020   Atrial fibrillation, chronic (HCC) 11/05/2020   CKD (chronic kidney  disease), stage III (HCC) 11/05/2020   Weakness 11/05/2020   Protein-calorie malnutrition (HCC) 11/05/2020   Disorder of refraction 06/05/2019   Dry eye syndrome of both eyes 06/05/2019   Posterior vitreous detachment of left eye 06/05/2019   Presence of intraocular lens 06/05/2019   Bipolar I disorder (HCC) 12/11/2013   Status post total bilateral knee replacement 05/27/2013   Previous back surgery 05/27/2013   Essential hypertension 04/22/2013   Knee pain, chronic 04/22/2013   Asthma 04/05/2011   Generalized osteoarthritis of multiple sites 08/17/2010   Bipolar affective disorder, manic, severe (HCC) 08/11/2010   Anemia 12/20/2007   Arthropathy 09/25/2006   Acquired absence of genital organ 08/17/2006   Hyperlipidemia 08/17/2006    Orientation RESPIRATION BLADDER Height & Weight     Self, Time, Situation  Normal Incontinent, Continent Weight: 95.8 kg Height:  5' 4 (162.6 cm)  BEHAVIORAL SYMPTOMS/MOOD NEUROLOGICAL BOWEL NUTRITION STATUS      Incontinent Diet (Dysphagia II, thin liquid)  AMBULATORY STATUS COMMUNICATION OF NEEDS Skin   Total Care Verbally Normal                       Personal Care Assistance Level of Assistance  Bathing, Feeding, Dressing, Total care Bathing Assistance: Maximum assistance Feeding assistance: Maximum assistance Dressing Assistance: Maximum assistance Total Care Assistance: Maximum assistance   Functional Limitations Info             SPECIAL CARE FACTORS FREQUENCY                       Contractures Contractures Info: Not present  Additional Factors Info  Code Status, Allergies Code Status Info: DNR-Limited Allergies Info: Iodine, Iodine contrast media, Shellfish, Nifedipine, Simvastatin, Lisinopril, Quinapril           Current Medications (08/29/2023):  This is the current hospital active medication list Current Facility-Administered Medications  Medication Dose Route Frequency Provider Last Rate Last Admin    acetaminophen  (TYLENOL ) tablet 650 mg  650 mg Oral Q6H PRN Niu, Xilin, MD       albuterol  (PROVENTIL ) (2.5 MG/3ML) 0.083% nebulizer solution 2.5 mg  2.5 mg Nebulization Q4H PRN Niu, Xilin, MD       Ampicillin-Sulbactam (UNASYN) 3 g in sodium chloride  0.9 % 100 mL IVPB  3 g Intravenous Q6H Nazari, Walid A, RPH 200 mL/hr at 08/29/23 0119 3 g at 08/29/23 0119   artificial tears ophthalmic solution 1 drop  1 drop Both Eyes PRN Madelynn Schilder, [facility]       carvedilol  (COREG ) tablet 25 mg  25 mg Oral BID WC Amin, Sumayya, MD   25 mg at 08/28/23 1534   [START ON 08/31/2023] cloNIDine  (CATAPRES  - Dosed in mg/24 hr) patch 0.3 mg  0.3 mg Transdermal Q Fri Amin, Sumayya, MD       dextromethorphan -guaiFENesin  (MUCINEX  DM) 30-600 MG per 12 hr tablet 1 tablet  1 tablet Oral BID PRN Niu, Xilin, MD       divalproex  (DEPAKOTE  SPRINKLE) capsule 125 mg  125 mg Oral BID Amin, Sumayya, MD   125 mg at 08/28/23 2157   Fe Fum-Vit C-Vit B12-FA (TRIGELS-F FORTE) capsule 1 capsule  1 capsule Oral BID Amin, Sumayya, MD   1 capsule at 08/28/23 2156   fluticasone  (FLONASE ) 50 MCG/ACT nasal spray 1 spray  1 spray Each Nare Daily Amin, Sumayya, MD       [START ON 08/30/2023] furosemide  (LASIX ) injection 40 mg  40 mg Intravenous Daily Amin, Sumayya, MD       hydrALAZINE  (APRESOLINE ) injection 10 mg  10 mg Intravenous Q2H PRN Niu, Xilin, MD       loratadine  (CLARITIN ) tablet 10 mg  10 mg Oral Daily Arlyne Bering M, [facility]       losartan  (COZAAR ) tablet 100 mg  100 mg Oral Daily Amin, Sumayya, MD   100 mg at 08/28/23 1534   metoprolol tartrate (LOPRESSOR) injection 2.5 mg  2.5 mg Intravenous Q2H PRN Niu, Xilin, MD       OLANZapine  (ZYPREXA ) tablet 5 mg  5 mg Oral QHS Amin, Sumayya, MD   5 mg at 08/28/23 2156   olopatadine  (PATANOL) 0.1 % ophthalmic solution 1 drop  1 drop Both Eyes BID Amin, Sumayya, MD   1 drop at 08/28/23 2158   ondansetron  (ZOFRAN ) injection 4 mg  4 mg Intravenous Q8H PRN Niu, Xilin, MD       pantoprazole   (PROTONIX ) EC tablet 40 mg  40 mg Oral Daily Amin, Sumayya, MD   40 mg at 08/28/23 1534   polyethylene glycol (MIRALAX  / GLYCOLAX ) packet 17 g  17 g Oral BID Amin, Sumayya, MD       sodium chloride  tablet 1 g  1 g Oral BID WC Niu, Xilin, MD   1 g at 08/28/23 1534   venlafaxine  XR (EFFEXOR -XR) 24 hr capsule 75 mg  75 mg Oral Daily Amin, Sumayya, MD   75 mg at 08/28/23 2157     Discharge Medications: Please see discharge summary for a list of discharge medications.  Relevant Imaging Results:  Relevant Lab Results:   Additional  Information SSN:[id number]  Alexandra Ice, RN

## 2023-08-29 NOTE — Assessment & Plan Note (Signed)
 Currently unclear etiology.  Initial CT head negative for any acute abnormality.  Mild electrolyte disturbance which has been resolved.  MRI brain was also negative for any acute abnormality. UA is not consistent with UTI Seems improving and getting closer to baseline. -Continue to monitor -Frequent neurochecks -Fall precautions

## 2023-08-29 NOTE — Assessment & Plan Note (Signed)
 No prior diagnosis of CHF.  1+ leg edema on admission which is now improving.  BNP elevated at 520.  Echocardiogram done in February 2023 with normal EF.  She was started on IV diuresis.  Slight increase in creatinine today -Holding further IV diuresis-we can start her on p.o. on discharge -Echocardiogram -normal - Daily weight and BMP -Strict intake and output

## 2023-08-29 NOTE — Assessment & Plan Note (Signed)
 Renal function stable. - Monitor renal function while she is being diuresed -Avoid nephrotoxins

## 2023-08-29 NOTE — Assessment & Plan Note (Signed)
 Resolved with IV fluid and Lasix  - Continue to monitor -If started trending up we can discontinue home Cozaar 

## 2023-08-29 NOTE — Assessment & Plan Note (Signed)
 Resolved. -Continue salt tablets -Continue to monitor

## 2023-08-29 NOTE — Assessment & Plan Note (Signed)
 Patient had cough and x-ray concerning for bilateral lower lobe airspace disease, possible aspiration pneumonia.  Although no fever or leukocytosis.  Strep pneumo negative, preliminary blood culture negative - Continue with Unasyn-will switch to Augmentin on discharge -Continue with supportive care

## 2023-08-29 NOTE — TOC Progression Note (Signed)
 Transition of Care [facility]) - Progression Note    Patient Details  Name: Katrina Chen MRN: [id number] Date of Birth: Oct 21, 1935  [facility] CM/SW Contact  Alexandra Ice, RN Phone Number: 08/29/2023, 8:39 AM  Clinical Narrative:     Benjaman Branch pending MD signature       Expected Discharge Plan and Services                                               Social Determinants of Health (SDOH) Interventions SDOH Screenings   Food Insecurity: No Food Insecurity (08/28/2023)  Housing: Low Risk  (08/28/2023)  Transportation Needs: No Transportation Needs (08/28/2023)  Utilities: Not At Risk (08/28/2023)  Tobacco Use: Low Risk  (08/27/2023)    Readmission Risk Interventions     No data to display

## 2023-08-30 DIAGNOSIS — E871 Hypo-osmolality and hyponatremia: Secondary | ICD-10-CM | POA: Diagnosis not present

## 2023-08-30 DIAGNOSIS — R600 Localized edema: Secondary | ICD-10-CM | POA: Diagnosis not present

## 2023-08-30 DIAGNOSIS — G9341 Metabolic encephalopathy: Secondary | ICD-10-CM | POA: Diagnosis not present

## 2023-08-30 DIAGNOSIS — J690 Pneumonitis due to inhalation of food and vomit: Secondary | ICD-10-CM | POA: Diagnosis not present

## 2023-08-30 LAB — BASIC METABOLIC PANEL WITH GFR
Anion gap: 8 (ref 5–15)
BUN: 28 mg/dL — ABNORMAL HIGH (ref 8–23)
CO2: 31 mmol/L (ref 22–32)
Calcium: 9 mg/dL (ref 8.9–10.3)
Chloride: 99 mmol/L (ref 98–111)
Creatinine, Ser: 1.22 mg/dL — ABNORMAL HIGH (ref 0.44–1.00)
GFR, Estimated: 43 mL/min — ABNORMAL LOW (ref >60)
Glucose, Bld: 75 mg/dL (ref 70–99)
Potassium: 5.1 mmol/L (ref 3.5–5.1)
Sodium: 138 mmol/L (ref 135–145)

## 2023-08-30 LAB — MRSA NEXT GEN BY PCR, NASAL: MRSA by PCR Next Gen: DETECTED — AB

## 2023-08-30 MED ORDER — CHLORHEXIDINE GLUCONATE CLOTH 2 % EX PADS: 6.0000 | MEDICATED_PAD | Freq: Every day | CUTANEOUS | Status: DC

## 2023-08-30 MED ORDER — MUPIROCIN 2 % EX OINT
1.0000 | TOPICAL_OINTMENT | Freq: Two times a day (BID) | CUTANEOUS | Status: DC
Start: 2023-08-30 — End: 2023-09-04
  Administered 2023-08-30 – 2023-08-31 (×2): 1 via NASAL
  Filled 2023-08-30: qty 22

## 2023-08-30 NOTE — Care Management Important Message (Signed)
 Important Message  Patient Details  Name: Katrina Chen MRN: [id number] Date of Birth: 06-23-1935   Important Message Given:  Yes - Medicare IM     Belmira Daley W, CMA 08/30/2023, 12:35 PM

## 2023-08-30 NOTE — Progress Notes (Signed)
 Patient retained urine during night. Patient bladder scan 350cc at 0600. Patient unable to urinate and In/Out cath was 550cc

## 2023-08-30 NOTE — Progress Notes (Signed)
 Progress Note   Patient: Katrina Chen ZOX:[id number] DOB: [birth year] DOA: 08/27/2023     3 DOS: the patient was seen and examined on 08/30/2023   Brief hospital course: Taken from H&P.  Doralene Glanz is a 88 y.o. adult with medical history significant of bedbound, A-fib on Eliquis , s/p of PPM, HTN, HLD, CAD, dementia, bipolar, depression, CKD-3, anemia, liver cirrhosis, lumbar spinal stenosis with chronic back pain, who presents with altered mental status and cough.   At baseline patient is bedbound but alert and oriented x 3.  Over the past 3 days progressive decline with altered mental status, cough and unable to clear her secretions.  No respiratory distress.  Unsure about any UTI symptoms.  On presentation patient was not following any commands and quite obtunded. Vitals mostly stable.  Labs with no leukocytosis, BNP 520, potassium 5.4, sodium 131, troponin 10, subtherapeutic valproic acid  and ammonia level of 29. CT head was negative for any acute intracranial abnormalities. CXR with mild congestive heart failure, small right and probable small left pleural effusion.  Right greater than left lower lungs airspace disease could represent atelectasis or infection. EKG with A-fib, QTc 477, LAD, poor progression, anteroseptal infarction pattern.  Aspiration pneumonia.  Patient was started on Unasyn for concern of aspiration pneumonia and IV diuresis.  6/16: Vital stable, UA was not consistent with UTI.  Labs stable and strep pneumo negative.  Preliminary blood cultures negative in 12 hours.  Hyponatremia and hyperkalemia resolved.  Patient with significant dysarthria which is not her baseline per daughter-MRI ordered, hopefully can be done as patient has a pacemaker in place.  6/17: Vitals and labs stable.  Echocardiogram was normal.  Decreased IV Lasix  to daily instead of twice daily.  6/18: Hemodynamically stable.  MRI brain with no acute intracranial abnormalities, mild chronic microvascular  changes and remote infarcts in the central pons and left cerebellum.  Dysarthria improving.  6/19: Hemodynamically stable but unable to urinate overnight requiring In-N-Out catheter.  Giving her a voiding trial with periodic bladder scan.  Holding further IV diuresis.  Assessment and Plan: * Aspiration pneumonia (HCC) Patient had cough and x-ray concerning for bilateral lower lobe airspace disease, possible aspiration pneumonia.  Although no fever or leukocytosis.  Strep pneumo negative, preliminary blood culture negative - Continue with Unasyn-will switch to Augmentin on discharge -Continue with supportive care  Acute metabolic encephalopathy Currently unclear etiology.  Initial CT head negative for any acute abnormality.  Mild electrolyte disturbance which has been resolved.  MRI brain was also negative for any acute abnormality. UA is not consistent with UTI Seems improving and getting closer to baseline. -Continue to monitor -Frequent neurochecks -Fall precautions  Bilateral leg edema No prior diagnosis of CHF.  1+ leg edema on admission which is now improving.  BNP elevated at 520.  Echocardiogram done in February 2023 with normal EF.  She was started on IV diuresis.  Slight increase in creatinine today -Holding further IV diuresis-we can start her on p.o. on discharge -Echocardiogram -normal - Daily weight and BMP -Strict intake and output  Hyponatremia Resolved. -Continue salt tablets -Continue to monitor  CAD (coronary artery disease) No chest pain and troponin negative -Continuing home Coreg   Atrial fibrillation, chronic (HCC) Heart rate well-controlled. - Patient was not taking Eliquis  which was listed in her meds -Continue with Coreg   Essential hypertension Blood pressure mildly elevated. -Continue home Coreg , clonidine  patch and Cozaar  Can restart hydralazine  if needed  Hyperkalemia Resolved with IV fluid and Lasix  - Continue  to monitor -If started trending  up we can discontinue home Cozaar   Chronic kidney disease, stage 3a (HCC) Renal function stable.  Slight in creasing creatinine but did not meet the criteria for AKI - Monitor renal function while she is being diuresed -Avoid nephrotoxins  Iron  deficiency anemia - Continue iron  supplement  Cirrhosis (HCC) Ammonia level 29.  INR 1.2 and no transaminitis. - Continue to monitor  Bipolar affective disorder, manic, severe (HCC) Patient had subtherapeutic Depakote  levels at 20 on admission -Continue home olanzapine , Effexor  and Depakote    Subjective: Patient was seen and examined today.  No new concern.  Per daughter getting closer to baseline.  Patient denies any desire to urinate at this time  Physical Exam: Vitals:   08/30/23 0500 08/30/23 0749 08/30/23 1000 08/30/23 1529  BP: (!) 142/60 (!) 129/36 (!) 152/63 (!) 147/88  Pulse: 69 (!) 59 99 (!) 59  Resp: 17 20 20 17   Temp: 97.7 F (36.5 C) 98.8 F (37.1 C)  98.9 F (37.2 C)  TempSrc: Oral     SpO2: 98% 100% 98% 100%  Weight:      Height:       General.  Frail, obese elderly lady, in no acute distress. Pulmonary.  Lungs clear bilaterally, normal respiratory effort. CV.  Regular rate and rhythm, no JVD, rub or murmur. Abdomen.  Soft, nontender, nondistended, BS positive. CNS.  Alert, no apparent new deficit Extremities.  No edema, no cyanosis, pulses intact and symmetrical.  Data Reviewed: Prior data reviewed  Family Communication: Discussed with daughter at bedside  Disposition: Status is: Inpatient Remains inpatient appropriate because: Severity of illness  Planned Discharge Destination: Skilled nursing facility  Time spent: 45 minutes  This record has been created using Conservation officer, historic buildings. Errors have been sought and corrected,but may not always be located. Such creation errors do not reflect on the standard of care.   Author: Luna Salinas, MD 08/30/2023 4:53 PM  For on call review  [email or web address].

## 2023-08-30 NOTE — Plan of Care (Signed)
   Problem: Coping: Goal: Level of anxiety will decrease Outcome: Progressing

## 2023-08-31 DIAGNOSIS — R600 Localized edema: Secondary | ICD-10-CM | POA: Diagnosis not present

## 2023-08-31 DIAGNOSIS — I2510 Atherosclerotic heart disease of native coronary artery without angina pectoris: Secondary | ICD-10-CM | POA: Diagnosis not present

## 2023-08-31 DIAGNOSIS — J690 Pneumonitis due to inhalation of food and vomit: Secondary | ICD-10-CM | POA: Diagnosis not present

## 2023-08-31 DIAGNOSIS — G9341 Metabolic encephalopathy: Secondary | ICD-10-CM | POA: Diagnosis not present

## 2023-08-31 MED ORDER — AMOXICILLIN-POT CLAVULANATE 500-125 MG PO TABS: 1.0000 | ORAL_TABLET | Freq: Two times a day (BID) | ORAL | Status: AC

## 2023-08-31 MED ORDER — MUPIROCIN 2 % EX OINT
1.0000 | TOPICAL_OINTMENT | Freq: Two times a day (BID) | CUTANEOUS | Status: AC
Start: 2023-08-31 — End: ?

## 2023-08-31 MED ORDER — DM-GUAIFENESIN ER 30-600 MG PO TB12: 1.0000 | ORAL_TABLET | Freq: Two times a day (BID) | ORAL | Status: AC | PRN

## 2023-08-31 MED ORDER — FE FUM-VIT C-VIT B12-FA 460-60-0.01-1 MG PO CAPS: 1.0000 | ORAL_CAPSULE | Freq: Two times a day (BID) | ORAL | Status: AC

## 2023-08-31 MED ORDER — AMOXICILLIN-POT CLAVULANATE 500-125 MG PO TABS
1.0000 | ORAL_TABLET | Freq: Two times a day (BID) | ORAL | Status: DC
  Administered 2023-08-31: 1 via ORAL
  Filled 2023-08-31: qty 1

## 2023-08-31 MED ORDER — TORSEMIDE 20 MG PO TABS: 20.0000 mg | ORAL_TABLET | Freq: Every day | ORAL | Status: AC | PRN

## 2023-08-31 NOTE — Discharge Summary (Signed)
 Physician Discharge Summary   Patient: Katrina Chen MRN: [id number] DOB: 01/24/1936  Admit date:     08/27/2023  Discharge date: 08/31/23  Discharge Physician: Luna Salinas   PCP: Marlinda Simon, FNP   Recommendations at discharge:  Please obtain CBC and BMP and follow-up Please ensure completion of antibiotics for aspiration pneumonia Please check blood pressure before giving blood pressure medications due to some variation in her blood pressure. Please avoid sedating pain medications if possible Follow-up with primary care provider within a week  Discharge Diagnoses: Principal Problem:   Aspiration pneumonia (HCC) Active Problems:   Acute metabolic encephalopathy   CAD (coronary artery disease)   Hyponatremia   Bilateral leg edema   Atrial fibrillation, chronic (HCC)   Essential hypertension   Hyperkalemia   Iron  deficiency anemia   Chronic kidney disease, stage 3a (HCC)   Cirrhosis (HCC)   Bipolar affective disorder, manic, severe [facility])   Hospital Course: Taken from H&P.  Katrina Chen is a 88 y.o. adult with medical history significant of bedbound, A-fib on Eliquis , s/p of PPM, HTN, HLD, CAD, dementia, bipolar, depression, CKD-3, anemia, liver cirrhosis, lumbar spinal stenosis with chronic back pain, who presents with altered mental status and cough.   At baseline patient is bedbound but alert and oriented x 3.  Over the past 3 days progressive decline with altered mental status, cough and unable to clear her secretions.  No respiratory distress.  Unsure about any UTI symptoms.  On presentation patient was not following any commands and quite obtunded. Vitals mostly stable.  Labs with no leukocytosis, BNP 520, potassium 5.4, sodium 131, troponin 10, subtherapeutic valproic acid  and ammonia level of 29. CT head was negative for any acute intracranial abnormalities. CXR with mild congestive heart failure, small right and probable small left pleural effusion.  Right greater  than left lower lungs airspace disease could represent atelectasis or infection. EKG with A-fib, QTc 477, LAD, poor progression, anteroseptal infarction pattern.  Aspiration pneumonia.  Patient was started on Unasyn for concern of aspiration pneumonia and IV diuresis.  6/16: Vital stable, UA was not consistent with UTI.  Labs stable and strep pneumo negative.  Preliminary blood cultures negative in 12 hours.  Hyponatremia and hyperkalemia resolved.  Patient with significant dysarthria which is not her baseline per daughter-MRI ordered, hopefully can be done as patient has a pacemaker in place.  6/17: Vitals and labs stable.  Echocardiogram was normal.  Decreased IV Lasix  to daily instead of twice daily.  6/18: Hemodynamically stable.  MRI brain with no acute intracranial abnormalities, mild chronic microvascular changes and remote infarcts in the central pons and left cerebellum.  Dysarthria improving.  6/19: Hemodynamically stable but unable to urinate overnight requiring In-N-Out catheter.  Giving her a voiding trial with periodic bladder scan.  Holding further IV diuresis.  6/20: Remained hemodynamically stable.  Likely will need to continue 2 L of oxygen and her SNF can try weaning her off.  She received Unasyn for concern of aspiration pneumonia during hospitalization and is being discharged on Augmentin to complete the course.  Patient was able to void later on.  Mentation now at baseline.  She lives in a facility and basically bedbound.  She will continue with her current medications and need to have a close follow-up with her providers for further assistance.  Assessment and Plan: * Aspiration pneumonia (HCC) Patient had cough and x-ray concerning for bilateral lower lobe airspace disease, possible aspiration pneumonia.  Although no fever or leukocytosis.  Strep pneumo negative, preliminary blood culture negative - Continue with Unasyn-will switch to Augmentin on discharge -Continue  with supportive care  Acute metabolic encephalopathy Currently unclear etiology.  Initial CT head negative for any acute abnormality.  Mild electrolyte disturbance which has been resolved.  MRI brain was also negative for any acute abnormality. UA is not consistent with UTI Improved and now at baseline  Bilateral leg edema No prior diagnosis of CHF.  1+ leg edema on admission which is now improving.  BNP elevated at 520.  Echocardiogram done in February 2023 with normal EF.  She was started on IV diuresis.  Slight increase in creatinine today -Holding further IV diuresis-we can start her on p.o. on discharge -Echocardiogram -normal  Hyponatremia Resolved. -Continue to monitor - Please add some salt to diet, can also use salt tablets if needed  CAD (coronary artery disease) No chest pain and troponin negative -Continuing home Coreg   Atrial fibrillation, chronic (HCC) Heart rate well-controlled. - Patient was not taking Eliquis  which was listed in her meds -Continue with Coreg   Essential hypertension Blood pressure mildly elevated. -Continue home Coreg , clonidine  patch and Cozaar  Can restart hydralazine  if needed  Hyperkalemia Resolved with IV fluid and Lasix  - Continue to monitor -If started trending up we can discontinue home Cozaar   Chronic kidney disease, stage 3a (HCC) Renal function stable.  Slight in creasing creatinine but did not meet the criteria for AKI - Monitor renal function while she is being diuresed -Avoid nephrotoxins  Iron  deficiency anemia - Continue iron  supplement  Cirrhosis (HCC) Ammonia level 29.  INR 1.2 and no transaminitis. - Continue to monitor  Bipolar affective disorder, manic, severe (HCC) Patient had subtherapeutic Depakote  levels at 20 on admission -Continue home olanzapine , Effexor  and Depakote    Consultants: None Procedures performed: None Disposition: Skilled nursing facility Diet recommendation:  Regular diet DISCHARGE  MEDICATION: Allergies as of 08/31/2023       Reactions   Iodine Swelling   Amlodipine Other (See Comments)   Nifedipine Swelling, Other (See Comments)   Iodinated Contrast Media    Lisinopril Swelling   Quinapril Swelling   Shellfish Allergy    Simvastatin Swelling        Medication List     STOP taking these medications    apixaban  2.5 MG Tabs tablet Commonly known as: ELIQUIS    cyanocobalamin 1000 MCG tablet Commonly known as: VITAMIN B12   cycloSPORINE  0.05 % ophthalmic emulsion Commonly known as: RESTASIS    ferrous sulfate  325 (65 FE) MG tablet   iron  polysaccharides 150 MG capsule Commonly known as: NIFEREX   oxycodone  5 MG capsule Commonly known as: OXY-IR   oxyCODONE  5 MG immediate release tablet Commonly known as: Oxy IR/ROXICODONE    prazosin  1 MG capsule Commonly known as: MINIPRESS    traMADol  50 MG tablet Commonly known as: ULTRAM        TAKE these medications    acetaminophen  500 MG tablet Commonly known as: TYLENOL  Take 1,000 mg by mouth every 8 (eight) hours.   Alumina-Magnesia-Simethicone  200-200-20 MG/5ML suspension Generic drug: alum & mag hydroxide-simeth Take 15 mLs by mouth every 6 (six) hours as needed for indigestion or heartburn.   amoxicillin -clavulanate 500-125 MG tablet Commonly known as: AUGMENTIN Take 1 tablet by mouth 2 (two) times daily for 2 doses.   benzonatate 100 MG capsule Commonly known as: TESSALON Take 100 mg by mouth 3 (three) times daily as needed for cough.   bisacodyl  10 MG suppository Commonly known as: DULCOLAX Place 1 suppository (  10 mg total) rectally daily as needed for mild constipation.   carboxymethylcellulose 1 % ophthalmic solution Apply 1 drop to eye 2 (two) times daily.   carvedilol  25 MG tablet Commonly known as: COREG  Take 25 mg by mouth 2 (two) times daily with a meal.   cloNIDine  0.3 mg/24hr patch Commonly known as: CATAPRES  - Dosed in mg/24 hr Place 0.3 mg onto the skin every  Friday.   Cranberry 450 MG Tabs Take 1 tablet by mouth daily.   dextromethorphan -guaiFENesin  30-600 MG 12hr tablet Commonly known as: MUCINEX  DM Take 1 tablet by mouth 2 (two) times daily as needed for cough.   divalproex  125 MG capsule Commonly known as: DEPAKOTE  SPRINKLE Take 125 mg by mouth 2 (two) times daily.   Fe Fum-Vit C-Vit B12-FA Caps capsule Commonly known as: TRIGELS-F FORTE Take 1 capsule by mouth 2 (two) times daily.   fluticasone  50 MCG/ACT nasal spray Commonly known as: FLONASE  Place 1 spray into both nostrils daily.   hydrALAZINE  50 MG tablet Commonly known as: APRESOLINE  Take 1 tablet (50 mg total) by mouth every 8 (eight) hours. What changed:  how much to take Another medication with the same name was removed. Continue taking this medication, and follow the directions you see here.   ipratropium-albuterol  0.5-2.5 (3) MG/3ML Soln Commonly known as: DUONEB Inhale 3 mLs into the lungs every 6 (six) hours as needed.   levocetirizine 5 MG tablet Commonly known as: XYZAL Take 5 mg by mouth daily.   losartan  100 MG tablet Commonly known as: COZAAR  Take 100 mg by mouth daily.   melatonin 5 MG Tabs Take 10 mg by mouth at bedtime.   methenamine 1 g tablet Commonly known as: HIPREX Take 1 g by mouth 2 (two) times daily.   multivitamin with minerals tablet Take 1 tablet by mouth daily.   mupirocin ointment 2 % Commonly known as: BACTROBAN Place 1 Application into the nose 2 (two) times daily.   naloxone 4 MG/0.1ML Liqd nasal spray kit Commonly known as: NARCAN Place 1 spray into the nose once.   OLANZapine  5 MG tablet Commonly known as: ZYPREXA  Take 5 mg by mouth at bedtime.   olopatadine  0.1 % ophthalmic solution Commonly known as: PATANOL Place 1 drop into both eyes 2 (two) times daily.   omeprazole  20 MG capsule Commonly known as: PRILOSEC Take 20 mg by mouth daily.   ondansetron  4 MG disintegrating tablet Commonly known as:  ZOFRAN -ODT Take 4 mg by mouth every 8 (eight) hours as needed for vomiting or nausea.   polyethylene glycol 17 g packet Commonly known as: MIRALAX  / GLYCOLAX  Take 17 g by mouth 2 (two) times daily.   senna-docusate 8.6-50 MG tablet Commonly known as: Senokot-S Take 1 tablet by mouth 2 (two) times daily.   simethicone  80 MG chewable tablet Commonly known as: MYLICON Chew 80 mg by mouth 3 (three) times daily as needed for flatulence.   torsemide 20 MG tablet Commonly known as: DEMADEX Take 1 tablet (20 mg total) by mouth daily as needed (Follow her extremity edema and fluid overload).   venlafaxine  XR 75 MG 24 hr capsule Commonly known as: EFFEXOR -XR Take 75 mg by mouth daily.        Follow-up Information     Marlinda Simon, FNP. Schedule an appointment as soon as possible for a visit in 1 week(s).   Specialty: Family Medicine Contact information: [address] [place] [address] [phone]  Discharge Exam: Filed Weights   08/27/23 1711  Weight: 95.8 kg   General.  Frail elderly lady, in no acute distress. Pulmonary.  Lungs clear bilaterally, normal respiratory effort. CV.  Regular rate and rhythm, no JVD, rub or murmur. Abdomen.  Soft, nontender, nondistended, BS positive. CNS.  Alert and oriented to self only.  No new focal neurologic deficit. Extremities.  No edema,  pulses intact and symmetrical.   Condition at discharge: stable  The results of significant diagnostics from this hospitalization (including imaging, microbiology, ancillary and laboratory) are listed below for reference.   Imaging Studies: MR BRAIN WO CONTRAST Result Date: 08/29/2023 CLINICAL DATA:  Neuro deficit, concern for stroke, altered mental status for the past 3 days. EXAM: MRI HEAD WITHOUT CONTRAST TECHNIQUE: Multiplanar, multiecho pulse sequences of the brain and surrounding structures were obtained without intravenous contrast. COMPARISON:  CT head 08/27/2023.  FINDINGS: Brain: No acute infarct. No evidence of intracranial hemorrhage. T2/FLAIR hyperintensity in the periventricular and subcortical white matter. Remote infarct in the central pons. Additional small remote infarct in the left cerebellum. No edema, mass effect, or midline shift. Normal appearance of midline structures. The basilar cisterns are patent. No extra-axial fluid collections. Ventricles: Prominence of the lateral ventricles suggestive of underlying parenchymal volume loss. Vascular: Skull base flow voids are visualized. Skull and upper cervical spine: No focal abnormality. Sinuses/Orbits: Bilateral lens replacement. Paranasal sinuses are clear. Other: Mastoid air cells are clear. IMPRESSION: No acute intracranial abnormality. Mild chronic microvascular ischemic changes. Mild generalized parenchymal volume loss. 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 Electronically Signed   By: Denny Flack M.D.   On: 08/29/2023 12:32   ECHOCARDIOGRAM COMPLETE Result Date: 08/28/2023    ECHOCARDIOGRAM REPORT   Patient Name:   IZZAH PASQUA Date of Exam: 08/28/2023 Medical Rec #:  [id number]  Height:       64.0 in Accession #:    [id number] Weight:       211.2 lb Date of Birth:  05-May-1935  BSA:          2.002 m Patient Age:    88 years   BP:           132/55 mmHg Patient Gender: F          HR:           61 bpm. Exam Location:  [facility] Procedure: 2D Echo, Cardiac Doppler and Color Doppler (Both Spectral and Color            Flow Doppler were utilized during procedure). Indications:     CHF--acute diastolic I50.31  History:         Patient has prior history of Echocardiogram examinations, most                  recent 06/17/2021. Arrythmias:Atrial Fibrillation; Risk                  Factors:Hypertension.  Sonographer:     Broadus Canes Referring Phys:  [id number] XILIN NIU Diagnosing Phys: Antonette Batters MD  Sonographer Comments: Technically challenging study due to limited acoustic windows, no apical window and no  subcostal window. IMPRESSIONS  1. Left ventricular ejection fraction, by estimation, is 55 to 60%. The left ventricle has normal function. The left ventricle has no regional wall motion abnormalities. There is mild concentric left ventricular hypertrophy. Left ventricular diastolic parameters were normal.  2. Right ventricular systolic function is normal. The right ventricular size is normal.  3. The  mitral valve is normal in structure. Trivial mitral valve regurgitation.  4. The aortic valve is normal in structure. Aortic valve regurgitation is not visualized. FINDINGS  Left Ventricle: Left ventricular ejection fraction, by estimation, is 55 to 60%. The left ventricle has normal function. The left ventricle has no regional wall motion abnormalities. Global longitudinal strain performed but not reported based on interpreter judgement due to suboptimal tracking. The left ventricular internal cavity size was normal in size. There is mild concentric left ventricular hypertrophy. Left ventricular diastolic parameters were normal. Right Ventricle: The right ventricular size is normal. No increase in right ventricular wall thickness. Right ventricular systolic function is normal. Left Atrium: Left atrial size was normal in size. Right Atrium: Right atrial size was normal in size. Pericardium: There is no evidence of pericardial effusion. Mitral Valve: The mitral valve is normal in structure. Trivial mitral valve regurgitation. Tricuspid Valve: The tricuspid valve is normal in structure. Tricuspid valve regurgitation is trivial. Aortic Valve: The aortic valve is normal in structure. Aortic valve regurgitation is not visualized. Pulmonic Valve: The pulmonic valve was normal in structure. Pulmonic valve regurgitation is not visualized. Aorta: The ascending aorta was not well visualized. IAS/Shunts: No atrial level shunt detected by color flow Doppler. Additional Comments: 3D was performed not requiring image post processing  on an independent workstation and was indeterminate.  LEFT VENTRICLE PLAX 2D LVIDd:         4.20 cm LVIDs:         2.90 cm LV PW:         1.30 cm LV IVS:        1.40 cm LVOT diam:     2.00 cm LVOT Area:     3.14 cm  LEFT ATRIUM         Index LA diam:    2.90 cm 1.45 cm/m   AORTA Ao Root diam: 2.60 cm TRICUSPID VALVE TR Peak grad:   31.1 mmHg TR Vmax:        279.00 cm/s  SHUNTS Systemic Diam: 2.00 cm Antonette Batters MD Electronically signed by Antonette Batters MD Signature Date/Time: 08/28/2023/10:38:04 PM    Final    CT Head Wo Contrast Result Date: 08/27/2023 CLINICAL DATA:  Mental status change. EXAM: CT HEAD WITHOUT CONTRAST TECHNIQUE: Contiguous axial images were obtained from the base of the skull through the vertex without intravenous contrast. RADIATION DOSE REDUCTION: This exam was performed according to the departmental dose-optimization program which includes automated exposure control, adjustment of the mA and/or kV according to patient size and/or use of iterative reconstruction technique. COMPARISON:  None Available. FINDINGS: Brain: No acute intracranial hemorrhage. No focal mass lesion. No CT evidence of acute infarction. No midline shift or mass effect. No hydrocephalus. Basilar cisterns are patent. There are periventricular and subcortical white matter hypodensities. Generalized cortical atrophy. Vascular: No hyperdense vessel or unexpected calcification. Skull: Normal. Negative for fracture or focal lesion. Sinuses/Orbits: Paranasal sinuses and mastoid air cells are clear. Orbits are clear. Other: None. IMPRESSION: 1. No acute intracranial findings. 2. Atrophy and white matter microvascular disease. Electronically Signed   By: Deboraha Fallow M.D.   On: 08/27/2023 18:51   DG Chest 1 View Result Date: 08/27/2023 CLINICAL DATA:  Altered mental status EXAM: CHEST  1 VIEW COMPARISON:  06/27/2022 FINDINGS: Mildly degraded exam due to AP portable technique and patient body habitus. Midline  trachea. Cardiomegaly accentuated by AP portable technique. Atherosclerosis in the transverse aorta. Small right and probable small left  pleural effusions. No pneumothorax. Interstitial edema is mild, asymmetric right. Right greater than left lower lung airspace disease. Leadless pacer projects over left chest wall. IMPRESSION: Mild congestive heart failure. Small right and probable small left pleural effusion. Right greater than left lower lung airspace disease could represent atelectasis or concurrent infection. Aortic Atherosclerosis (ICD10-I70.0). Mild limitations as detailed above. Electronically Signed   By: Lore Rode M.D.   On: 08/27/2023 18:40    Microbiology: Results for orders placed or performed during the hospital encounter of 08/27/23  Resp panel by RT-PCR (RSV, Flu A&B, Covid) Anterior Nasal Swab     Status: None   Collection Time: 08/27/23  7:17 PM   Specimen: Anterior Nasal Swab  Result Value Ref Range Status   SARS Coronavirus 2 by RT PCR NEGATIVE NEGATIVE Final    Comment: (NOTE) SARS-CoV-2 target nucleic acids are NOT DETECTED.  The SARS-CoV-2 RNA is generally detectable in upper respiratory specimens during the acute phase of infection. The lowest concentration of SARS-CoV-2 viral copies this assay can detect is 138 copies/mL. A negative result does not preclude SARS-Cov-2 infection and should not be used as the sole basis for treatment or other patient management decisions. A negative result may occur with  improper specimen collection/handling, submission of specimen other than nasopharyngeal swab, presence of viral mutation(s) within the areas targeted by this assay, and inadequate number of viral copies(<138 copies/mL). A negative result must be combined with clinical observations, patient history, and epidemiological information. The expected result is Negative.  Fact Sheet for Patients:  BloggerCourse.com  Fact Sheet for Healthcare  Providers:  SeriousBroker.it  This test is no t yet approved or cleared by the [place]  FDA and  has been authorized for detection and/or diagnosis of SARS-CoV-2 by FDA under an Emergency Use Authorization (EUA). This EUA will remain  in effect (meaning this test can be used) for the duration of the COVID-19 declaration under Section 564(b)(1) of the Act, 21 U.S.C.section 360bbb-3(b)(1), unless the authorization is terminated  or revoked sooner.       Influenza A by PCR NEGATIVE NEGATIVE Final   Influenza B by PCR NEGATIVE NEGATIVE Final    Comment: (NOTE) The Xpert Xpress SARS-CoV-2/FLU/RSV plus assay is intended as an aid in the diagnosis of influenza from Nasopharyngeal swab specimens and should not be used as a sole basis for treatment. Nasal washings and aspirates are unacceptable for Xpert Xpress SARS-CoV-2/FLU/RSV testing.  Fact Sheet for Patients: BloggerCourse.com  Fact Sheet for Healthcare Providers: SeriousBroker.it  This test is not yet approved or cleared by the [place]  FDA and has been authorized for detection and/or diagnosis of SARS-CoV-2 by FDA under an Emergency Use Authorization (EUA). This EUA will remain in effect (meaning this test can be used) for the duration of the COVID-19 declaration under Section 564(b)(1) of the Act, 21 U.S.C. section 360bbb-3(b)(1), unless the authorization is terminated or revoked.     Resp Syncytial Virus by PCR NEGATIVE NEGATIVE Final    Comment: (NOTE) Fact Sheet for Patients: BloggerCourse.com  Fact Sheet for Healthcare Providers: SeriousBroker.it  This test is not yet approved or cleared by the [place]  FDA and has been authorized for detection and/or diagnosis of SARS-CoV-2 by FDA under an Emergency Use Authorization (EUA). This EUA will remain in effect (meaning this test can be  used) for the duration of the COVID-19 declaration under Section 564(b)(1) of the Act, 21 U.S.C. section 360bbb-3(b)(1), unless the authorization is terminated or revoked.  Performed at [facility], [address] [place], [place] [address]   Culture, blood (Routine X 2) w Reflex to ID Panel     Status: None (Preliminary result)   Collection Time: 08/27/23  9:11 PM   Specimen: BLOOD  Result Value Ref Range Status   Specimen Description BLOOD BLOOD LEFT ARM  Final   Special Requests   Final    BOTTLES DRAWN AEROBIC AND ANAEROBIC Blood Culture adequate volume   Culture   Final    NO GROWTH 4 DAYS Performed at [facility], [address], [place], [place] [address]    Report Status PENDING  Incomplete  Culture, blood (Routine X 2) w Reflex to ID Panel     Status: None (Preliminary result)   Collection Time: 08/27/23  9:40 PM   Specimen: BLOOD  Result Value Ref Range Status   Specimen Description BLOOD BLOOD RIGHT HAND  Final   Special Requests   Final    BOTTLES DRAWN AEROBIC AND ANAEROBIC Blood Culture adequate volume   Culture   Final    NO GROWTH 4 DAYS Performed at [facility], [address] [place], [place] [address]    Report Status PENDING  Incomplete  MRSA Next Gen by PCR, Nasal     Status: Abnormal   Collection Time: 08/30/23  5:40 PM   Specimen: Nasal Mucosa; Nasal Swab  Result Value Ref Range Status   MRSA by PCR Next Gen DETECTED (A) NOT DETECTED Final    Comment: RESULT CALLED TO, READ BACK BY AND VERIFIED WITH: VANESSA MCWHITE @1922  ON 08/30/23 SKL (NOTE) The GeneXpert MRSA Assay (FDA approved for NASAL specimens only), is one component of a comprehensive MRSA colonization surveillance program. It is not intended to diagnose MRSA infection nor to guide or monitor treatment for MRSA infections. Test performance is not FDA approved in patients less than 62 years old. Performed at [facility], [address]  [place], [place] [address]     Labs: CBC: Recent Labs  Lab 08/27/23 1722 08/28/23 0603 08/29/23 0450  WBC 8.9 8.2 9.0  HGB 9.5* 8.7* 9.0*  HCT 31.3* 29.3* 30.2*  MCV 82.4 82.8 81.6  PLT 401* 373 337   Basic Metabolic Panel: Recent Labs  Lab 08/27/23 1722 08/28/23 0603 08/29/23 0450 08/30/23 1127  NA 131* 135 135 138  K 5.4* 4.9 4.8 5.1  CL 98 99 95* 99  CO2 25 27 29 31   GLUCOSE 100* 63* 52* 75  BUN 26* 26* 28* 28*  CREATININE 1.02* 1.00 1.13* 1.22*  CALCIUM  9.5 9.1 9.3 9.0   Liver Function Tests: Recent Labs  Lab 08/27/23 1722  AST 15  ALT 12  ALKPHOS 103  BILITOT 0.6  PROT 7.3  ALBUMIN 2.6*   CBG: No results for input(s): GLUCAP in the last 168 hours.  Discharge time spent: greater than 30 minutes.  This record has been created using Conservation officer, historic buildings. Errors have been sought and corrected,but may not always be located. Such creation errors do not reflect on the standard of care.   Signed: Luna Salinas, MD [facility] 08/31/2023

## 2023-08-31 NOTE — TOC Transition Note (Signed)
 Transition of Care [facility]) - Discharge Note   Patient Details  Name: Katrina Chen MRN: [id number] Date of Birth: Sep 28, 1935  Transition of Care [facility]) CM/SW Contact:  Alexandra Ice, RN Phone Number: 08/31/2023, 10:26 AM   Clinical Narrative:     Patient to discharge today, she is LTC at [facility]. Discharge summary and orders sent to facility via HUB. Patient returning to room 153, nurse to call report to [phone]. TOC spoke to Myrtie Atkinson at [facility] patient #4 on schedule for pickup. TOC spoke with Adah Acron notified her patient to discharge today. Notified MD and bedside nurse. EMS packet printed to nurse station.   Final next level of care: Long Term Nursing Home Barriers to Discharge: Barriers Resolved   Patient Goals and CMS Choice     Choice offered to / list presented to : Patient [place] ownership interest in [facility].provided to:: Patient    Discharge Placement                Patient to be transferred to facility by: [facility] Name of family member notified: Marilyn Shropshire Patient and family notified of of transfer: 08/31/23  Discharge Plan and Services Additional resources added to the After Visit Summary for                    DME Agency: NA       HH Arranged: NA          Social Drivers of Health (SDOH) Interventions SDOH Screenings   Food Insecurity: No Food Insecurity (08/28/2023)  Housing: Low Risk  (08/28/2023)  Transportation Needs: No Transportation Needs (08/28/2023)  Utilities: Not At Risk (08/28/2023)  Tobacco Use: Low Risk  (08/27/2023)     Readmission Risk Interventions     No data to display

## 2023-08-31 NOTE — Plan of Care (Signed)

## 2023-08-31 NOTE — Progress Notes (Signed)
 Patient discharging to [facility], Report called, discharge education and teaching provided. IV removed, all belongings sent with patient

## 2023-09-01 LAB — CULTURE, BLOOD (ROUTINE X 2)
Culture: NO GROWTH
Culture: NO GROWTH
Special Requests: ADEQUATE
Special Requests: ADEQUATE

## 2024-04-13 DEATH — deceased
